# Patient Record
Sex: Female | Born: 1955 | Race: Black or African American | Hispanic: No | Smoking: Never smoker
Health system: Southern US, Community
[De-identification: ages and names within clinical notes are randomized; demographics above are authoritative.]

## PROBLEM LIST (undated history)

## (undated) DIAGNOSIS — Z923 Personal history of irradiation: Secondary | ICD-10-CM

## (undated) DIAGNOSIS — I10 Essential (primary) hypertension: Secondary | ICD-10-CM

## (undated) DIAGNOSIS — I5022 Chronic systolic (congestive) heart failure: Secondary | ICD-10-CM

## (undated) DIAGNOSIS — E079 Disorder of thyroid, unspecified: Secondary | ICD-10-CM

## (undated) DIAGNOSIS — K635 Polyp of colon: Secondary | ICD-10-CM

## (undated) DIAGNOSIS — C801 Malignant (primary) neoplasm, unspecified: Secondary | ICD-10-CM

## (undated) DIAGNOSIS — R112 Nausea with vomiting, unspecified: Secondary | ICD-10-CM

## (undated) DIAGNOSIS — Z803 Family history of malignant neoplasm of breast: Secondary | ICD-10-CM

## (undated) DIAGNOSIS — Z9889 Other specified postprocedural states: Secondary | ICD-10-CM

## (undated) DIAGNOSIS — Z806 Family history of leukemia: Secondary | ICD-10-CM

## (undated) HISTORY — DX: Disorder of thyroid, unspecified: E07.9

## (undated) HISTORY — DX: Chronic systolic (congestive) heart failure: I50.22

## (undated) HISTORY — PX: GANGLION CYST EXCISION: SHX1691

## (undated) HISTORY — DX: Family history of leukemia: Z80.6

## (undated) HISTORY — DX: Family history of malignant neoplasm of breast: Z80.3

---

## 1998-04-18 HISTORY — PX: ABDOMINAL HYSTERECTOMY: SHX81

## 2001-05-30 ENCOUNTER — Ambulatory Visit (HOSPITAL_COMMUNITY): Admission: RE | Admit: 2001-05-30 | Discharge: 2001-05-30 | Payer: Self-pay | Admitting: Specialist

## 2001-05-30 ENCOUNTER — Encounter: Payer: Self-pay | Admitting: Specialist

## 2001-08-17 ENCOUNTER — Ambulatory Visit (HOSPITAL_COMMUNITY): Admission: RE | Admit: 2001-08-17 | Discharge: 2001-08-17 | Payer: Self-pay | Admitting: General Surgery

## 2002-08-28 ENCOUNTER — Encounter: Payer: Self-pay | Admitting: Family Medicine

## 2002-08-28 ENCOUNTER — Ambulatory Visit (HOSPITAL_COMMUNITY): Admission: RE | Admit: 2002-08-28 | Discharge: 2002-08-28 | Payer: Self-pay | Admitting: Family Medicine

## 2003-09-19 ENCOUNTER — Ambulatory Visit (HOSPITAL_COMMUNITY): Admission: RE | Admit: 2003-09-19 | Discharge: 2003-09-19 | Payer: Self-pay | Admitting: Family Medicine

## 2003-09-25 ENCOUNTER — Ambulatory Visit (HOSPITAL_COMMUNITY): Admission: RE | Admit: 2003-09-25 | Discharge: 2003-09-25 | Payer: Self-pay | Admitting: General Surgery

## 2004-09-22 ENCOUNTER — Ambulatory Visit (HOSPITAL_COMMUNITY): Admission: RE | Admit: 2004-09-22 | Discharge: 2004-09-22 | Payer: Self-pay | Admitting: Family Medicine

## 2005-10-24 ENCOUNTER — Ambulatory Visit (HOSPITAL_COMMUNITY): Admission: RE | Admit: 2005-10-24 | Discharge: 2005-10-24 | Payer: Self-pay | Admitting: Specialist

## 2006-11-06 ENCOUNTER — Ambulatory Visit (HOSPITAL_COMMUNITY): Admission: RE | Admit: 2006-11-06 | Discharge: 2006-11-06 | Payer: Self-pay | Admitting: Specialist

## 2007-11-08 ENCOUNTER — Ambulatory Visit (HOSPITAL_COMMUNITY): Admission: RE | Admit: 2007-11-08 | Discharge: 2007-11-08 | Payer: Self-pay | Admitting: Family Medicine

## 2008-11-10 ENCOUNTER — Ambulatory Visit (HOSPITAL_COMMUNITY): Admission: RE | Admit: 2008-11-10 | Discharge: 2008-11-10 | Payer: Self-pay | Admitting: Family Medicine

## 2008-11-11 ENCOUNTER — Encounter: Payer: Self-pay | Admitting: Internal Medicine

## 2008-11-16 HISTORY — PX: COLONOSCOPY: SHX174

## 2008-11-18 ENCOUNTER — Ambulatory Visit (HOSPITAL_COMMUNITY): Admission: RE | Admit: 2008-11-18 | Discharge: 2008-11-18 | Payer: Self-pay | Admitting: Internal Medicine

## 2008-11-18 ENCOUNTER — Ambulatory Visit: Payer: Self-pay | Admitting: Internal Medicine

## 2009-11-12 ENCOUNTER — Ambulatory Visit (HOSPITAL_COMMUNITY): Admission: RE | Admit: 2009-11-12 | Discharge: 2009-11-12 | Payer: Self-pay | Admitting: Family Medicine

## 2010-08-31 NOTE — Op Note (Signed)
Patricia Nunez, Patricia Nunez               ACCOUNT NO.:  [id number]   MEDICAL RECORD NO.:  [id number]          PATIENT TYPE:  AMB   LOCATION:  DAY                           FACILITY:  [facility]   PHYSICIAN:  R. Roetta Sessions, M.D. DATE OF BIRTH:  1956/02/13   DATE OF PROCEDURE:  11/18/2008  DATE OF DISCHARGE:                               OPERATIVE REPORT   INDICATIONS FOR PROCEDURE:  A 55 year old lady underwent removal of a  rectal polyp by Dr. Katrinka Blazing some 5 years ago.  She is here for  surveillance.  Pathology is unknown on this polyp which was removed.  Patricia Nunez has no lower GI tract symptoms, and there is no family  history of colon polyps or colon cancer.  Colonoscopy is now being done  as a surveillance maneuver.  Risks, benefits, alternatives and  limitations have been discussed, questions answered.  Please see the  documentation in the medical record.   PROCEDURE NOTE:  O2 saturation, blood pressure, pulse respirations were  monitored throughout the entire procedure.  Conscious sedation with  Versed 3 mg IV and Demerol 50 mg IV in divided doses.  Instrument:  Pentax video chip system.   FINDINGS:  Digital rectal exam revealed no abnormalities.   ENDOSCOPIC FINDINGS:  The prep was good.   Colon:  Colonic mucosa was surveyed from the rectosigmoid junction  through the left transverse right colon to the appendiceal orifice,  ileocecal valve and cecum.  These structures were well seen and  photographed for the record.  From this level, scope was slowly  withdrawn.  All previous mentioned mucosal surfaces were again seen.  The colonic mucosa appeared normal.  Scope was pulled down in the rectum  where thorough examination of the rectal mucosa including retroflexed  view anal verge demonstrated no abnormalities.  The patient tolerated  the procedure well and was reactive to endoscopy.  Cecal withdrawal time  7 minutes.   IMPRESSION:  1. Normal rectum.  2. Normal colon.   RECOMMENDATIONS:  Repeat colonoscopy in 5 years.      Jonathon Bellows, M.D.  Electronically Signed     RMR/MEDQ  D:  11/18/2008  T:  11/18/2008  Job:  [id number]

## 2010-09-03 NOTE — H&P (Signed)
[facility]  Patient:    Patricia Nunez, Patricia Nunez Visit Number: [id number] MRN: [id number]          Service Type: Attending:  Elpidio Anis, M.D. Dictated by:   Elpidio Anis, M.D.                           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  She is status post excision of the cyst in July of 2000.  Because of the increasing size she wishes to have the mass removed.  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  She has no other major medical illness.  She has occasional seasonal allergies.  CURRENT MEDICATIONS:  Her only medication is Zoloft 50 mg q.d.  ALLERGIES:  She has no known drug allergies.  SURGERY: 1. Hysterectomy. 2. Removal of ganglion cyst, left wrist.  PHYSICAL EXAMINATION:  VITAL SIGNS:  Blood pressure 132/82, pulse 72, respirations 18, weight 109 pounds.  HEENT:  Unremarkable.  NECK:  Supple without JVD or bruit.  CHEST:  Clear to auscultation.  HEART:  Regular rate and rhythm without murmur, gallop or rub.  ABDOMEN:  Soft, nontender, no masses.  EXTREMITIES:  Unremarkable except for a large soft ganglion cyst on dorsal aspect of the left wrist.  She has no tenderness.  NEUROLOGIC EXAM:  Nonfocal.  No motor, sensory or cerebellar deficits.  IMPRESSION: 1. Ganglion cyst, left wrist, recurrent. 2. Mild depression.  PLAN:  Excision of ganglion cyst under Bier block. Dictated by:   Elpidio Anis, M.D. Attending:  Elpidio Anis, M.D. DD:  08/16/01 TD:  08/17/01 Job: [id number]

## 2010-09-03 NOTE — H&P (Signed)
NAMEROBBYE, DEDE                         ACCOUNT NO.:  [id number]   MEDICAL RECORD NO.:  [id number]                   PATIENT TYPE:  AMB   LOCATION:  DAY                                  FACILITY:  [facility]   PHYSICIAN:  Jerolyn Shin C. Katrinka Blazing, M.D.                DATE OF BIRTH:  11-15-1955   DATE OF ADMISSION:  09/25/2003  DATE OF DISCHARGE:                                HISTORY & PHYSICAL   HISTORY OF PRESENT ILLNESS:  [age]-year-old female is referred for screening  colonoscopy.  She has no GI complaints, no history of rectal bleeding.   PAST HISTORY:  Past history is positive only for depression.   CHRONIC MEDICATIONS:  None.   SURGERY:  Hysterectomy.   ALLERGIES:  None.   SOCIAL HISTORY:  She is employed with the [employer].  She is the mother of 1 child, divorced, does not drink, smoke or use drugs.   PHYSICAL EXAMINATION:  VITAL SIGNS:  On exam, blood pressure 110/80, pulse  76, respirations 20, weight 125 pounds.  HEENT:  Unremarkable.  NECK:  Neck is supple with no JVD or bruit.  CHEST:  Chest clear to auscultation.  HEART:  Heart regular rate and rhythm without murmur, gallop or rub.  ABDOMEN:  Abdomen soft and nontender.  No masses.  EXTREMITIES:  No cyanosis, clubbing or edema.  NEUROLOGICAL EXAM:  No focal motor, sensory or cerebellar deficit.   IMPRESSION:  Need for screening colonoscopy.   PLAN:  Screening colonoscopy.     ___________________________________________                                         Dirk Dress. Katrinka Blazing, M.D.   LCS/MEDQ  D:  09/24/2003  T:  09/25/2003  Job:  [id number]

## 2010-09-03 NOTE — Op Note (Signed)
[facility]  Patient:    Patricia Nunez, Patricia Nunez Visit Number: [id number] MRN: [id number]          Service Type: DSU Location: DAY Attending Physician:  Dessa Phi Dictated by:   Elpidio Anis, M.D. Proc. Date: 08/17/01 Admit Date:  08/17/2001 Discharge Date: 08/17/2001                             Operative Report  PREOPERATIVE DIAGNOSIS:  Ganglion cyst, left wrist.  POSTOPERATIVE DIAGNOSIS:  Ganglion cyst, left wrist.  PROCEDURE:  Excision of ganglion cyst, left wrist.  SURGEON:  Elpidio Anis, M.D.  DESCRIPTION OF PROCEDURE:  The patient was taken to the operating suite. Bier block was placed. The left arm was prepped and draped in a sterile field. A transverse incision was made over the dome of the cyst. The incision was taken down through the subcutaneous tissue until the wall of the cyst was exposed. A small retractor was placed and using small iris scissors, the cyst was circumferentially dissected. The base of the cyst was excised. The base of the cyst appeared to be the overlying fascia of the extensor tendons of the hand. Their tendons were not encountered and were not injured. The subcutaneous tissue was closed with two rows of 4-0 Dexon and the skin was closed with 4-0 Dexon. OpSite dressing and a Kerlix gauze dressing was placed. The patient tolerated the procedure well. She was transferred to a bed and taken to the post anesthesia care unit for monitoring. Dictated by:   Elpidio Anis, M.D. Attending Physician:  Dessa Phi DD:  08/17/01 TD:  08/19/01 Job: [id number]

## 2010-10-11 ENCOUNTER — Other Ambulatory Visit (HOSPITAL_COMMUNITY): Payer: Self-pay | Admitting: Family Medicine

## 2010-10-11 DIAGNOSIS — Z139 Encounter for screening, unspecified: Secondary | ICD-10-CM

## 2010-11-15 ENCOUNTER — Ambulatory Visit (HOSPITAL_COMMUNITY)
Admission: RE | Admit: 2010-11-15 | Discharge: 2010-11-15 | Disposition: A | Discharge status: Home / Self Care | Payer: BC Managed Care – PPO | Source: Ambulatory Visit | Attending: Family Medicine | Admitting: Family Medicine

## 2010-11-15 DIAGNOSIS — Z139 Encounter for screening, unspecified: Secondary | ICD-10-CM

## 2010-11-15 DIAGNOSIS — Z1231 Encounter for screening mammogram for malignant neoplasm of breast: Secondary | ICD-10-CM | POA: Insufficient documentation

## 2011-10-25 ENCOUNTER — Other Ambulatory Visit (HOSPITAL_COMMUNITY): Payer: Self-pay | Admitting: Family Medicine

## 2011-10-25 DIAGNOSIS — Z139 Encounter for screening, unspecified: Secondary | ICD-10-CM

## 2011-11-21 ENCOUNTER — Ambulatory Visit (HOSPITAL_COMMUNITY)
Admission: RE | Admit: 2011-11-21 | Discharge: 2011-11-21 | Disposition: A | Discharge status: Home / Self Care | Payer: BC Managed Care – PPO | Source: Ambulatory Visit | Attending: Family Medicine | Admitting: Family Medicine

## 2011-11-21 DIAGNOSIS — Z139 Encounter for screening, unspecified: Secondary | ICD-10-CM

## 2011-11-21 DIAGNOSIS — Z1231 Encounter for screening mammogram for malignant neoplasm of breast: Secondary | ICD-10-CM | POA: Insufficient documentation

## 2011-11-28 ENCOUNTER — Other Ambulatory Visit: Payer: Self-pay | Admitting: Family Medicine

## 2011-11-28 DIAGNOSIS — R928 Other abnormal and inconclusive findings on diagnostic imaging of breast: Secondary | ICD-10-CM

## 2011-11-30 ENCOUNTER — Ambulatory Visit
Admission: RE | Admit: 2011-11-30 | Discharge: 2011-11-30 | Disposition: A | Discharge status: Home / Self Care | Payer: BC Managed Care – PPO | Source: Ambulatory Visit | Attending: Family Medicine | Admitting: Family Medicine

## 2011-11-30 DIAGNOSIS — R928 Other abnormal and inconclusive findings on diagnostic imaging of breast: Secondary | ICD-10-CM

## 2012-10-29 ENCOUNTER — Other Ambulatory Visit (HOSPITAL_COMMUNITY): Payer: Self-pay | Admitting: Family Medicine

## 2012-10-29 DIAGNOSIS — Z09 Encounter for follow-up examination after completed treatment for conditions other than malignant neoplasm: Secondary | ICD-10-CM

## 2012-11-30 ENCOUNTER — Ambulatory Visit (HOSPITAL_COMMUNITY)
Admission: RE | Admit: 2012-11-30 | Discharge: 2012-11-30 | Disposition: A | Discharge status: Home / Self Care | Payer: BC Managed Care – PPO | Source: Ambulatory Visit | Attending: Family Medicine | Admitting: Family Medicine

## 2012-11-30 DIAGNOSIS — Z09 Encounter for follow-up examination after completed treatment for conditions other than malignant neoplasm: Secondary | ICD-10-CM

## 2012-11-30 DIAGNOSIS — Z1231 Encounter for screening mammogram for malignant neoplasm of breast: Secondary | ICD-10-CM | POA: Insufficient documentation

## 2013-10-02 ENCOUNTER — Other Ambulatory Visit (HOSPITAL_COMMUNITY): Payer: Self-pay | Admitting: Family Medicine

## 2013-10-02 DIAGNOSIS — Z1231 Encounter for screening mammogram for malignant neoplasm of breast: Secondary | ICD-10-CM

## 2013-11-25 ENCOUNTER — Encounter: Payer: Self-pay | Admitting: Internal Medicine

## 2013-12-02 ENCOUNTER — Ambulatory Visit (HOSPITAL_COMMUNITY)
Admission: RE | Admit: 2013-12-02 | Discharge: 2013-12-02 | Disposition: A | Discharge status: Home / Self Care | Payer: BC Managed Care – PPO | Source: Ambulatory Visit | Attending: Family Medicine | Admitting: Family Medicine

## 2013-12-02 DIAGNOSIS — Z1231 Encounter for screening mammogram for malignant neoplasm of breast: Secondary | ICD-10-CM | POA: Insufficient documentation

## 2014-03-04 NOTE — Patient Instructions (Signed)
Patricia Nunez  03/04/2014   Your procedure is scheduled on: 03/10/14  Report to [facility] at 2355DD.  Call this number if you have problems the morning of surgery: [phone]   Remember:   Do not eat food or drink liquids after midnight.   Take these medicines the morning of surgery with A SIP OF WATER: lisinopril   Do not wear jewelry, make-up or nail polish.  Do not wear lotions, powders, or perfumes. You may wear deodorant.  Do not shave 48 hours prior to surgery. Men may shave face and neck.  Do not bring valuables to the hospital.  [facility] is not responsible                  for any belongings or valuables.               Contacts, dentures or bridgework may not be worn into surgery.  Leave suitcase in the car. After surgery it may be brought to your room.  For patients admitted to the hospital, discharge time is determined by your                treatment team.               Patients discharged the day of surgery will not be allowed to drive  home.  Name and phone number of your driver: family  Special Instructions: N/A   Please read over the following fact sheets that you were given: Anesthesia Post-op Instructions and Care and Recovery After Surgery    PATIENT INSTRUCTIONS POST-ANESTHESIA  IMMEDIATELY FOLLOWING SURGERY:  Do not drive or operate machinery for the first twenty four hours after surgery.  Do not make any important decisions for twenty four hours after surgery or while taking narcotic pain medications or sedatives.  If you develop intractable nausea and vomiting or a severe headache please notify your doctor immediately.  FOLLOW-UP:  Please make an appointment with your surgeon as instructed. You do not need to follow up with anesthesia unless specifically instructed to do so.  WOUND CARE INSTRUCTIONS (if applicable):  Keep a dry clean dressing on the anesthesia/puncture wound site if there is drainage.  Once the wound has quit draining you may leave it  open to air.  Generally you should leave the bandage intact for twenty four hours unless there is drainage.  If the epidural site drains for more than 36-48 hours please call the anesthesia department.  QUESTIONS?:  Please feel free to call your physician or the hospital operator if you have any questions, and they will be happy to assist you.      Cataract A cataract is a clouding of the lens of the eye. When a lens becomes cloudy, vision is reduced based on the degree and nature of the clouding. Many cataracts reduce vision to some degree. Some cataracts make people more near-sighted as they develop. Other cataracts increase glare. Cataracts that are ignored and become worse can sometimes look white. The white color can be seen through the pupil. CAUSES   Aging. However, cataracts may occur at any age, even in newborns.  Certain drugs.  Trauma to the eye.  Certain diseases such as diabetes.  Specific eye diseases such as chronic inflammation inside the eye or a sudden attack of a rare form of glaucoma.  Inherited or acquired medical problems. SYMPTOMS   Gradual, progressive drop in vision in the affected eye.  Severe, rapid visual loss. This most  often happens when trauma is the cause. DIAGNOSIS  To detect a cataract, an eye doctor examines the lens. Cataracts are best diagnosed with an exam of the eyes with the pupils enlarged (dilated) by drops.  TREATMENT  For an early cataract, vision may improve by using different eyeglasses or stronger lighting. If that does not help your vision, surgery is the only effective treatment. A cataract needs to be surgically removed when vision loss interferes with your everyday activities, such as driving, reading, or watching TV. A cataract may also have to be removed if it prevents examination or treatment of another eye problem. Surgery removes the cloudy lens and usually replaces it with a substitute lens (intraocular lens, IOL).  At a time when  both you and your doctor agree, the cataract will be surgically removed. If you have cataracts in both eyes, only one is usually removed at a time. This allows the operated eye to heal and be out of danger from any possible problems after surgery (such as infection or poor wound healing). In rare cases, a cataract may be doing damage to your eye. In these cases, your caregiver may advise surgical removal right away. The vast majority of people who have cataract surgery have better vision afterward. HOME CARE INSTRUCTIONS  If you are not planning surgery, you may be asked to do the following:  Use different eyeglasses.  Use stronger or brighter lighting.  Ask your eye doctor about reducing your medicine dose or changing medicines if it is thought that a medicine caused your cataract. Changing medicines does not make the cataract go away on its own.  Become familiar with your surroundings. Poor vision can lead to injury. Avoid bumping into things on the affected side. You are at a higher risk for tripping or falling.  Exercise extreme care when driving or operating machinery.  Wear sunglasses if you are sensitive to bright light or experiencing problems with glare. SEEK IMMEDIATE MEDICAL CARE IF:   You have a worsening or sudden vision loss.  You notice redness, swelling, or increasing pain in the eye.  You have a fever. Document Released: 04/04/2005 Document Revised: 06/27/2011 Document Reviewed: 11/26/2010 [facility] Patient Information 2015 [place], [place]. This information is not intended to replace advice given to you by your health care provider. Make sure you discuss any questions you have with your health care provider.

## 2014-03-05 ENCOUNTER — Other Ambulatory Visit: Payer: Self-pay

## 2014-03-05 ENCOUNTER — Encounter (HOSPITAL_COMMUNITY)
Admission: RE | Admit: 2014-03-05 | Discharge: 2014-03-05 | Disposition: A | Discharge status: Home / Self Care | Payer: BC Managed Care – PPO | Source: Ambulatory Visit | Attending: Ophthalmology | Admitting: Ophthalmology

## 2014-03-05 ENCOUNTER — Encounter (HOSPITAL_COMMUNITY): Payer: Self-pay

## 2014-03-05 DIAGNOSIS — Z01818 Encounter for other preprocedural examination: Secondary | ICD-10-CM | POA: Insufficient documentation

## 2014-03-05 HISTORY — DX: Other specified postprocedural states: Z98.890

## 2014-03-05 HISTORY — DX: Nausea with vomiting, unspecified: R11.2

## 2014-03-05 HISTORY — DX: Essential (primary) hypertension: I10

## 2014-03-05 LAB — HEMOGLOBIN AND HEMATOCRIT, BLOOD
HEMATOCRIT: 37.2 % (ref 36.0–46.0)
Hemoglobin: 12.4 g/dL (ref 12.0–15.0)

## 2014-03-05 LAB — BASIC METABOLIC PANEL
ANION GAP: 11 (ref 5–15)
BUN: 13 mg/dL (ref 6–23)
CALCIUM: 9.8 mg/dL (ref 8.4–10.5)
CO2: 28 meq/L (ref 19–32)
CREATININE: 0.73 mg/dL (ref 0.50–1.10)
Chloride: 102 mEq/L (ref 96–112)
GFR calc Af Amer: >90 mL/min (ref >90)
GFR calc non Af Amer: >90 mL/min (ref >90)
Glucose, Bld: 109 mg/dL — ABNORMAL HIGH (ref 70–99)
Potassium: 4.5 mEq/L (ref 3.7–5.3)
Sodium: 141 mEq/L (ref 137–147)

## 2014-03-05 NOTE — Pre-Procedure Instructions (Signed)
Patient given information to sign up for my chart at home. 

## 2014-03-07 MED ORDER — LIDOCAINE HCL (PF) 1 % IJ SOLN
INTRAMUSCULAR | Status: AC
  Filled 2014-03-07: qty 2

## 2014-03-07 MED ORDER — NEOMYCIN-POLYMYXIN-DEXAMETH 3.5-10000-0.1 OP SUSP
OPHTHALMIC | Status: AC
  Filled 2014-03-07: qty 5

## 2014-03-07 MED ORDER — PHENYLEPHRINE HCL 2.5 % OP SOLN
OPHTHALMIC | Status: AC
  Filled 2014-03-07: qty 15

## 2014-03-07 MED ORDER — CYCLOPENTOLATE-PHENYLEPHRINE OP SOLN OPTIME - NO CHARGE
OPHTHALMIC | Status: AC
  Filled 2014-03-07: qty 2

## 2014-03-07 MED ORDER — TETRACAINE HCL 0.5 % OP SOLN
OPHTHALMIC | Status: AC
  Filled 2014-03-07: qty 2

## 2014-03-07 MED ORDER — LIDOCAINE HCL 3.5 % OP GEL
OPHTHALMIC | Status: AC
  Filled 2014-03-07: qty 1

## 2014-03-10 ENCOUNTER — Ambulatory Visit (HOSPITAL_COMMUNITY): Payer: BC Managed Care – PPO | Admitting: Anesthesiology

## 2014-03-10 ENCOUNTER — Encounter (HOSPITAL_COMMUNITY): Payer: Self-pay | Admitting: *Deleted

## 2014-03-10 ENCOUNTER — Ambulatory Visit (HOSPITAL_COMMUNITY)
Admission: RE | Admit: 2014-03-10 | Discharge: 2014-03-10 | Disposition: A | Discharge status: Home / Self Care | Payer: BC Managed Care – PPO | Source: Ambulatory Visit | Attending: Ophthalmology | Admitting: Ophthalmology

## 2014-03-10 ENCOUNTER — Encounter (HOSPITAL_COMMUNITY)
Admission: RE | Disposition: A | Discharge status: Home / Self Care | Payer: Self-pay | Source: Ambulatory Visit | Attending: Ophthalmology

## 2014-03-10 DIAGNOSIS — Z79899 Other long term (current) drug therapy: Secondary | ICD-10-CM | POA: Diagnosis not present

## 2014-03-10 DIAGNOSIS — H25812 Combined forms of age-related cataract, left eye: Secondary | ICD-10-CM | POA: Insufficient documentation

## 2014-03-10 DIAGNOSIS — Z7982 Long term (current) use of aspirin: Secondary | ICD-10-CM | POA: Insufficient documentation

## 2014-03-10 DIAGNOSIS — I10 Essential (primary) hypertension: Secondary | ICD-10-CM | POA: Insufficient documentation

## 2014-03-10 HISTORY — PX: CATARACT EXTRACTION W/PHACO: SHX586

## 2014-03-10 SURGERY — PHACOEMULSIFICATION, CATARACT, WITH IOL INSERTION
Anesthesia: Monitor Anesthesia Care | Site: Eye | Laterality: Left

## 2014-03-10 MED ORDER — LIDOCAINE HCL 3.5 % OP GEL
1.0000 "application " | Freq: Once | OPHTHALMIC | Status: AC
  Administered 2014-03-10: 1 via OPHTHALMIC

## 2014-03-10 MED ORDER — EPINEPHRINE HCL 1 MG/ML IJ SOLN
INTRAOCULAR | Status: DC | PRN
  Administered 2014-03-10: 500 mL

## 2014-03-10 MED ORDER — MIDAZOLAM HCL 2 MG/2ML IJ SOLN
INTRAMUSCULAR | Status: AC
  Filled 2014-03-10: qty 2

## 2014-03-10 MED ORDER — BSS IO SOLN
INTRAOCULAR | Status: DC | PRN
  Administered 2014-03-10: 15 mL

## 2014-03-10 MED ORDER — CYCLOPENTOLATE-PHENYLEPHRINE 0.2-1 % OP SOLN
1.0000 [drp] | OPHTHALMIC | Status: AC
  Administered 2014-03-10 (×3): 1 [drp] via OPHTHALMIC

## 2014-03-10 MED ORDER — PROVISC 10 MG/ML IO SOLN
INTRAOCULAR | Status: DC | PRN
  Administered 2014-03-10: 0.85 mL via INTRAOCULAR

## 2014-03-10 MED ORDER — LACTATED RINGERS IV SOLN
INTRAVENOUS | Status: DC
  Administered 2014-03-10: 1000 mL via INTRAVENOUS

## 2014-03-10 MED ORDER — MIDAZOLAM HCL 2 MG/2ML IJ SOLN
1.0000 mg | INTRAMUSCULAR | Status: DC | PRN
  Administered 2014-03-10: 2 mg via INTRAVENOUS

## 2014-03-10 MED ORDER — LIDOCAINE HCL (PF) 1 % IJ SOLN
INTRAMUSCULAR | Status: DC | PRN
  Administered 2014-03-10: .8 mL

## 2014-03-10 MED ORDER — FENTANYL CITRATE 0.05 MG/ML IJ SOLN
25.0000 ug | INTRAMUSCULAR | Status: AC
  Administered 2014-03-10 (×2): 25 ug via INTRAVENOUS

## 2014-03-10 MED ORDER — FENTANYL CITRATE 0.05 MG/ML IJ SOLN
INTRAMUSCULAR | Status: AC
  Filled 2014-03-10: qty 2

## 2014-03-10 MED ORDER — TETRACAINE HCL 0.5 % OP SOLN
1.0000 [drp] | OPHTHALMIC | Status: AC
  Administered 2014-03-10 (×3): 1 [drp] via OPHTHALMIC

## 2014-03-10 MED ORDER — EPINEPHRINE HCL 1 MG/ML IJ SOLN
INTRAMUSCULAR | Status: AC
  Filled 2014-03-10: qty 1

## 2014-03-10 MED ORDER — PHENYLEPHRINE HCL 2.5 % OP SOLN
1.0000 [drp] | OPHTHALMIC | Status: AC
  Administered 2014-03-10 (×3): 1 [drp] via OPHTHALMIC

## 2014-03-10 MED ORDER — ONDANSETRON HCL 4 MG/2ML IJ SOLN
INTRAMUSCULAR | Status: AC
  Filled 2014-03-10: qty 2

## 2014-03-10 MED ORDER — NEOMYCIN-POLYMYXIN-DEXAMETH 3.5-10000-0.1 OP SUSP
OPHTHALMIC | Status: DC | PRN
  Administered 2014-03-10: 2 [drp] via OPHTHALMIC

## 2014-03-10 MED ORDER — POVIDONE-IODINE 5 % OP SOLN
OPHTHALMIC | Status: DC | PRN
  Administered 2014-03-10: 1 via OPHTHALMIC

## 2014-03-10 SURGICAL SUPPLY — 34 items
CAPSULAR TENSION RING-AMO (OPHTHALMIC RELATED) IMPLANT
CLOTH BEACON ORANGE TIMEOUT ST (SAFETY) ×2 IMPLANT
EYE SHIELD UNIVERSAL CLEAR (GAUZE/BANDAGES/DRESSINGS) ×2 IMPLANT
GLOVE BIO SURGEON STRL SZ 6.5 (GLOVE) IMPLANT
GLOVE BIO SURGEONS STRL SZ 6.5 (GLOVE)
GLOVE BIOGEL PI IND STRL 6.5 (GLOVE) IMPLANT
GLOVE BIOGEL PI IND STRL 7.0 (GLOVE) IMPLANT
GLOVE BIOGEL PI IND STRL 7.5 (GLOVE) IMPLANT
GLOVE BIOGEL PI INDICATOR 6.5 (GLOVE)
GLOVE BIOGEL PI INDICATOR 7.0 (GLOVE) ×2
GLOVE BIOGEL PI INDICATOR 7.5 (GLOVE) ×2
GLOVE ECLIPSE 6.5 STRL STRAW (GLOVE) IMPLANT
GLOVE ECLIPSE 7.0 STRL STRAW (GLOVE) IMPLANT
GLOVE ECLIPSE 7.5 STRL STRAW (GLOVE) IMPLANT
GLOVE EXAM NITRILE LRG STRL (GLOVE) IMPLANT
GLOVE EXAM NITRILE MD LF STRL (GLOVE) IMPLANT
GLOVE SKINSENSE NS SZ6.5 (GLOVE)
GLOVE SKINSENSE NS SZ7.0 (GLOVE)
GLOVE SKINSENSE STRL SZ6.5 (GLOVE) IMPLANT
GLOVE SKINSENSE STRL SZ7.0 (GLOVE) IMPLANT
KIT VITRECTOMY (OPHTHALMIC RELATED) IMPLANT
PAD ARMBOARD 7.5X6 YLW CONV (MISCELLANEOUS) ×2 IMPLANT
PROC W NO LENS (INTRAOCULAR LENS)
PROC W SPEC LENS (INTRAOCULAR LENS)
PROCESS W NO LENS (INTRAOCULAR LENS) IMPLANT
PROCESS W SPEC LENS (INTRAOCULAR LENS) IMPLANT
RETRACTOR IRIS SIGHTPATH (OPHTHALMIC RELATED) IMPLANT
RING MALYGIN (MISCELLANEOUS) IMPLANT
SIGHTPATH CAT PROC W REG LENS (Ophthalmic Related) ×3 IMPLANT
SYRINGE LUER LOK 1CC (MISCELLANEOUS) ×2 IMPLANT
TAPE SURG TRANSPORE 1 IN (GAUZE/BANDAGES/DRESSINGS) IMPLANT
TAPE SURGICAL TRANSPORE 1 IN (GAUZE/BANDAGES/DRESSINGS) ×2
VISCOELASTIC ADDITIONAL (OPHTHALMIC RELATED) IMPLANT
WATER STERILE IRR 250ML POUR (IV SOLUTION) ×2 IMPLANT

## 2014-03-10 NOTE — Anesthesia Preprocedure Evaluation (Signed)
Anesthesia Evaluation  Patient identified by MRN, date of birth, ID band Patient awake    Reviewed: Allergy & Precautions, H&P , NPO status , Patient's Chart, lab work & pertinent test results  History of Anesthesia Complications (+) PONV and history of anesthetic complications  Airway Mallampati: II  TM Distance: >3 FB     Dental  (+) Teeth Intact   Pulmonary neg pulmonary ROS,  breath sounds clear to auscultation        Cardiovascular hypertension, Pt. on medications Rhythm:Regular Rate:Normal     Neuro/Psych    GI/Hepatic negative GI ROS,   Endo/Other    Renal/GU      Musculoskeletal   Abdominal   Peds  Hematology   Anesthesia Other Findings   Reproductive/Obstetrics                             Anesthesia Physical Anesthesia Plan  ASA: II  Anesthesia Plan: MAC   Post-op Pain Management:    Induction: Intravenous  Airway Management Planned: Nasal Cannula  Additional Equipment:   Intra-op Plan:   Post-operative Plan:   Informed Consent: I have reviewed the patients History and Physical, chart, labs and discussed the procedure including the risks, benefits and alternatives for the proposed anesthesia with the patient or authorized representative who has indicated his/her understanding and acceptance.     Plan Discussed with:   Anesthesia Plan Comments:         Anesthesia Quick Evaluation  

## 2014-03-10 NOTE — Discharge Instructions (Addendum)

## 2014-03-10 NOTE — Anesthesia Postprocedure Evaluation (Signed)
  Anesthesia Post-op Note  Patient: Patricia Nunez  Procedure(s) Performed: Procedure(s) (LRB): CATARACT EXTRACTION PHACO AND INTRAOCULAR LENS PLACEMENT LEFT EYE (Left)  Patient Location:  Short Stay  Anesthesia Type: MAC  Level of Consciousness: awake  Airway and Oxygen Therapy: Patient Spontanous Breathing  Post-op Pain: none  Post-op Assessment: Post-op Vital signs reviewed, Patient's Cardiovascular Status Stable, Respiratory Function Stable, Patent Airway, No signs of Nausea or vomiting and Pain level controlled  Post-op Vital Signs: Reviewed and stable  Complications: No apparent anesthesia complications

## 2014-03-10 NOTE — H&P (Signed)
I have reviewed the H&P, the patient was re-examined, and I have identified no interval changes in medical condition and plan of care since the history and physical of record  

## 2014-03-10 NOTE — Op Note (Signed)
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  Assistants: None  Anesthesia: Topical with MAC  Indications: Painless, progressive loss of vision with compromise of daily activities.  Surgery: Cataract Extraction with Intraocular lens Implant Left Eye  Discription: The patient had dilating drops and viscous lidocaine placed into the Left eye in the pre-op holding area. After transfer to the operating room, a time out was performed. The patient was then prepped and draped. Beginning with a 22 degree blade a paracentesis port was made at the surgeon's 2 o'clock position. The anterior chamber was then filled with 1% non-preserved lidocaine. This was followed by filling the anterior chamber with Provisc.  A 2.68mm keratome blade was used to make a clear corneal incision at the temporal limbus.  A bent cystatome needle was used to create a continuous tear capsulotomy. Hydrodissection was performed with balanced salt solution on a Fine canula. The lens nucleus was then removed using the phacoemulsification handpiece. Residual cortex was removed with the I&A handpiece. The anterior chamber and capsular bag were refilled with Provisc. A posterior chamber intraocular lens was placed into the capsular bag with it's injector. The implant was positioned with the Kuglan hook. The Provisc was then removed from the anterior chamber and capsular bag with the I&A handpiece. Stromal hydration of the main incision and paracentesis port was performed with BSS on a Fine canula. The wounds were tested for leak which was negative. The patient tolerated the procedure well. There were no operative complications. The patient was then transferred to the recovery room in stable condition.  Complications: None  Specimen: None  EBL: None  Prosthetic device: Hoya iSert 250, power 17.5 D,  SN [id number].

## 2014-03-10 NOTE — Addendum Note (Signed)
Addendum  created 03/10/14 0813 by Vista Deck, CRNA   Modules edited: Flowsheet VN   Flowsheet VN:  [phone] Status

## 2014-03-10 NOTE — Transfer of Care (Signed)
Immediate Anesthesia Transfer of Care Note  Patient: Patricia Nunez  Procedure(s) Performed: Procedure(s) (LRB): CATARACT EXTRACTION PHACO AND INTRAOCULAR LENS PLACEMENT LEFT EYE (Left)  Patient Location: Shortstay  Anesthesia Type: MAC  Level of Consciousness: awake  Airway & Oxygen Therapy: Patient Spontanous Breathing   Post-op Assessment: Report given to PACU RN, Post -op Vital signs reviewed and stable and Patient moving all extremities  Post vital signs: Reviewed and stable  Complications: No apparent anesthesia complications

## 2014-03-10 NOTE — Anesthesia Procedure Notes (Signed)
Procedure Name: MAC Date/Time: 03/10/2014 7:53 AM Performed by: Vista Deck Pre-anesthesia Checklist: Patient identified, Emergency Drugs available, Suction available, Timeout performed and Patient being monitored Patient Re-evaluated:Patient Re-evaluated prior to inductionOxygen Delivery Method: Nasal Cannula

## 2014-03-11 ENCOUNTER — Encounter (HOSPITAL_COMMUNITY): Payer: Self-pay | Admitting: Ophthalmology

## 2014-04-08 ENCOUNTER — Encounter (HOSPITAL_COMMUNITY)
Admission: RE | Admit: 2014-04-08 | Discharge: 2014-04-08 | Disposition: A | Discharge status: Home / Self Care | Payer: BC Managed Care – PPO | Source: Ambulatory Visit | Attending: Ophthalmology | Admitting: Ophthalmology

## 2014-04-08 ENCOUNTER — Encounter (HOSPITAL_COMMUNITY): Payer: Self-pay

## 2014-04-14 ENCOUNTER — Ambulatory Visit (HOSPITAL_COMMUNITY): Payer: BC Managed Care – PPO | Admitting: Anesthesiology

## 2014-04-14 ENCOUNTER — Encounter (HOSPITAL_COMMUNITY)
Admission: RE | Disposition: A | Discharge status: Home / Self Care | Payer: Self-pay | Source: Ambulatory Visit | Attending: Ophthalmology

## 2014-04-14 ENCOUNTER — Ambulatory Visit (HOSPITAL_COMMUNITY)
Admission: RE | Admit: 2014-04-14 | Discharge: 2014-04-14 | Disposition: A | Discharge status: Home / Self Care | Payer: BC Managed Care – PPO | Source: Ambulatory Visit | Attending: Ophthalmology | Admitting: Ophthalmology

## 2014-04-14 ENCOUNTER — Encounter (HOSPITAL_COMMUNITY): Payer: Self-pay | Admitting: *Deleted

## 2014-04-14 DIAGNOSIS — H25811 Combined forms of age-related cataract, right eye: Secondary | ICD-10-CM | POA: Insufficient documentation

## 2014-04-14 HISTORY — PX: CATARACT EXTRACTION W/PHACO: SHX586

## 2014-04-14 SURGERY — PHACOEMULSIFICATION, CATARACT, WITH IOL INSERTION
Anesthesia: Monitor Anesthesia Care | Site: Eye | Laterality: Right

## 2014-04-14 MED ORDER — NEOMYCIN-POLYMYXIN-DEXAMETH 3.5-10000-0.1 OP SUSP
OPHTHALMIC | Status: DC | PRN
  Administered 2014-04-14: 2 [drp] via OPHTHALMIC

## 2014-04-14 MED ORDER — EPINEPHRINE HCL 1 MG/ML IJ SOLN
INTRAOCULAR | Status: DC | PRN
  Administered 2014-04-14: 500 mL

## 2014-04-14 MED ORDER — FENTANYL CITRATE 0.05 MG/ML IJ SOLN
INTRAMUSCULAR | Status: AC
  Filled 2014-04-14: qty 2

## 2014-04-14 MED ORDER — LIDOCAINE HCL (PF) 1 % IJ SOLN
INTRAMUSCULAR | Status: DC | PRN
  Administered 2014-04-14: .5 mL

## 2014-04-14 MED ORDER — CYCLOPENTOLATE-PHENYLEPHRINE 0.2-1 % OP SOLN
1.0000 [drp] | OPHTHALMIC | Status: AC
  Administered 2014-04-14 (×3): 1 [drp] via OPHTHALMIC

## 2014-04-14 MED ORDER — LIDOCAINE HCL 3.5 % OP GEL
1.0000 "application " | Freq: Once | OPHTHALMIC | Status: AC
  Administered 2014-04-14: 1 via OPHTHALMIC

## 2014-04-14 MED ORDER — EPINEPHRINE HCL 1 MG/ML IJ SOLN
INTRAMUSCULAR | Status: AC
  Filled 2014-04-14: qty 1

## 2014-04-14 MED ORDER — FENTANYL CITRATE 0.05 MG/ML IJ SOLN
25.0000 ug | INTRAMUSCULAR | Status: AC
  Administered 2014-04-14: 25 ug via INTRAVENOUS

## 2014-04-14 MED ORDER — MIDAZOLAM HCL 2 MG/2ML IJ SOLN
1.0000 mg | INTRAMUSCULAR | Status: DC | PRN
Start: 2014-04-14 — End: 2014-04-14
  Administered 2014-04-14: 2 mg via INTRAVENOUS
  Filled 2014-04-14: qty 2

## 2014-04-14 MED ORDER — LACTATED RINGERS IV SOLN
INTRAVENOUS | Status: DC
  Administered 2014-04-14: 07:00:00 via INTRAVENOUS

## 2014-04-14 MED ORDER — PROVISC 10 MG/ML IO SOLN
INTRAOCULAR | Status: DC | PRN
  Administered 2014-04-14: 0.85 mL via INTRAOCULAR

## 2014-04-14 MED ORDER — ONDANSETRON HCL 4 MG/2ML IJ SOLN
INTRAMUSCULAR | Status: AC
  Filled 2014-04-14: qty 2

## 2014-04-14 MED ORDER — BSS IO SOLN
INTRAOCULAR | Status: DC | PRN
  Administered 2014-04-14: 15 mL

## 2014-04-14 MED ORDER — LIDOCAINE 3.5 % OP GEL OPTIME - NO CHARGE
OPHTHALMIC | Status: DC | PRN
  Administered 2014-04-14: 1 [drp] via OPHTHALMIC

## 2014-04-14 MED ORDER — PHENYLEPHRINE HCL 2.5 % OP SOLN
1.0000 [drp] | OPHTHALMIC | Status: AC
  Administered 2014-04-14 (×3): 1 [drp] via OPHTHALMIC

## 2014-04-14 MED ORDER — POVIDONE-IODINE 5 % OP SOLN
OPHTHALMIC | Status: DC | PRN
  Administered 2014-04-14: 1 via OPHTHALMIC

## 2014-04-14 MED ORDER — ONDANSETRON HCL 4 MG/2ML IJ SOLN
4.0000 mg | Freq: Once | INTRAMUSCULAR | Status: AC
  Administered 2014-04-14: 4 mg via INTRAVENOUS

## 2014-04-14 MED ORDER — TETRACAINE HCL 0.5 % OP SOLN
1.0000 [drp] | OPHTHALMIC | Status: AC
Start: 2014-04-14 — End: 2014-04-14
  Administered 2014-04-14 (×3): 1 [drp] via OPHTHALMIC

## 2014-04-14 SURGICAL SUPPLY — 34 items
CAPSULAR TENSION RING-AMO (OPHTHALMIC RELATED) IMPLANT
CLOTH BEACON ORANGE TIMEOUT ST (SAFETY) ×2 IMPLANT
EYE SHIELD UNIVERSAL CLEAR (GAUZE/BANDAGES/DRESSINGS) ×2 IMPLANT
GLOVE BIO SURGEON STRL SZ 6.5 (GLOVE) IMPLANT
GLOVE BIO SURGEONS STRL SZ 6.5 (GLOVE)
GLOVE BIOGEL PI IND STRL 6.5 (GLOVE) IMPLANT
GLOVE BIOGEL PI IND STRL 7.0 (GLOVE) IMPLANT
GLOVE BIOGEL PI IND STRL 7.5 (GLOVE) IMPLANT
GLOVE BIOGEL PI INDICATOR 6.5 (GLOVE)
GLOVE BIOGEL PI INDICATOR 7.0 (GLOVE) ×4
GLOVE BIOGEL PI INDICATOR 7.5 (GLOVE)
GLOVE ECLIPSE 6.5 STRL STRAW (GLOVE) IMPLANT
GLOVE ECLIPSE 7.0 STRL STRAW (GLOVE) IMPLANT
GLOVE ECLIPSE 7.5 STRL STRAW (GLOVE) IMPLANT
GLOVE EXAM NITRILE LRG STRL (GLOVE) IMPLANT
GLOVE EXAM NITRILE MD LF STRL (GLOVE) IMPLANT
GLOVE SKINSENSE NS SZ6.5 (GLOVE)
GLOVE SKINSENSE NS SZ7.0 (GLOVE)
GLOVE SKINSENSE STRL SZ6.5 (GLOVE) IMPLANT
GLOVE SKINSENSE STRL SZ7.0 (GLOVE) IMPLANT
KIT VITRECTOMY (OPHTHALMIC RELATED) IMPLANT
PAD ARMBOARD 7.5X6 YLW CONV (MISCELLANEOUS) ×2 IMPLANT
PROC W NO LENS (INTRAOCULAR LENS)
PROC W SPEC LENS (INTRAOCULAR LENS)
PROCESS W NO LENS (INTRAOCULAR LENS) IMPLANT
PROCESS W SPEC LENS (INTRAOCULAR LENS) IMPLANT
RETRACTOR IRIS SIGHTPATH (OPHTHALMIC RELATED) IMPLANT
RING MALYGIN (MISCELLANEOUS) IMPLANT
SIGHTPATH CAT PROC W REG LENS (Ophthalmic Related) ×3 IMPLANT
SYRINGE LUER LOK 1CC (MISCELLANEOUS) ×2 IMPLANT
TAPE SURG TRANSPORE 1 IN (GAUZE/BANDAGES/DRESSINGS) IMPLANT
TAPE SURGICAL TRANSPORE 1 IN (GAUZE/BANDAGES/DRESSINGS) ×2
VISCOELASTIC ADDITIONAL (OPHTHALMIC RELATED) IMPLANT
WATER STERILE IRR 250ML POUR (IV SOLUTION) ×2 IMPLANT

## 2014-04-14 NOTE — Op Note (Signed)
Date of Admission: 04/14/2014  Date of Surgery: 04/14/2014   Pre-Op Dx: Cataract Right Eye  Post-Op Dx: Senile Combined Cataract Right  Eye,  Dx Code E26.834  Surgeon: Tonny Branch, M.D.  Assistants: None  Anesthesia: Topical with MAC  Indications: Painless, progressive loss of vision with compromise of daily activities.  Surgery: Cataract Extraction with Intraocular lens Implant Right Eye  Discription: The patient had dilating drops and viscous lidocaine placed into the Right eye in the pre-op holding area. After transfer to the operating room, a time out was performed. The patient was then prepped and draped. Beginning with a 6 degree blade a paracentesis port was made at the surgeon's 2 o'clock position. The anterior chamber was then filled with 1% non-preserved lidocaine. This was followed by filling the anterior chamber with Provisc.  A 2.92mm keratome blade was used to make a clear corneal incision at the temporal limbus.  A bent cystatome needle was used to create a continuous tear capsulotomy. Hydrodissection was performed with balanced salt solution on a Fine canula. The lens nucleus was then removed using the phacoemulsification handpiece. Residual cortex was removed with the I&A handpiece. The anterior chamber and capsular bag were refilled with Provisc. A posterior chamber intraocular lens was placed into the capsular bag with it's injector. The implant was positioned with the Kuglan hook. The Provisc was then removed from the anterior chamber and capsular bag with the I&A handpiece. Stromal hydration of the main incision and paracentesis port was performed with BSS on a Fine canula. The wounds were tested for leak which was negative. The patient tolerated the procedure well. There were no operative complications. The patient was then transferred to the recovery room in stable condition.  Complications: None  Specimen: None  EBL: None  Prosthetic device: Hoya iSert 250, power 17.0  D, SN [id number].

## 2014-04-14 NOTE — Anesthesia Postprocedure Evaluation (Signed)
  Anesthesia Post-op Note  Patient: Patricia Nunez  Procedure(s) Performed: Procedure(s) (LRB): CATARACT EXTRACTION PHACO AND INTRAOCULAR LENS PLACEMENT (IOC) (Right)  Patient Location:  Short Stay  Anesthesia Type: MAC  Level of Consciousness: awake  Airway and Oxygen Therapy: Patient Spontanous Breathing  Post-op Pain: none  Post-op Assessment: Post-op Vital signs reviewed, Patient's Cardiovascular Status Stable, Respiratory Function Stable, Patent Airway, No signs of Nausea or vomiting and Pain level controlled  Post-op Vital Signs: Reviewed and stable  Complications: No apparent anesthesia complications

## 2014-04-14 NOTE — Anesthesia Procedure Notes (Signed)
Procedure Name: MAC Date/Time: 04/14/2014 7:27 AM Performed by: Vista Deck Pre-anesthesia Checklist: Patient identified, Emergency Drugs available, Suction available, Timeout performed and Patient being monitored Patient Re-evaluated:Patient Re-evaluated prior to inductionOxygen Delivery Method: Nasal Cannula

## 2014-04-14 NOTE — Addendum Note (Signed)
Addendum  created 04/14/14 0803 by Vista Deck, CRNA   Modules edited: Anesthesia Flowsheet, Anesthesia Medication Administration

## 2014-04-14 NOTE — H&P (Signed)
I have reviewed the H&P, the patient was re-examined, and I have identified no interval changes in medical condition and plan of care since the history and physical of record  

## 2014-04-14 NOTE — Discharge Instructions (Signed)

## 2014-04-14 NOTE — Anesthesia Preprocedure Evaluation (Signed)
Anesthesia Evaluation  Patient identified by MRN, date of birth, ID band Patient awake    Reviewed: Allergy & Precautions, H&P , NPO status , Patient's Chart, lab work & pertinent test results  History of Anesthesia Complications (+) PONV and history of anesthetic complications  Airway Mallampati: II  TM Distance: >3 FB     Dental  (+) Teeth Intact   Pulmonary neg pulmonary ROS,  breath sounds clear to auscultation        Cardiovascular hypertension, Pt. on medications Rhythm:Regular Rate:Normal     Neuro/Psych    GI/Hepatic negative GI ROS,   Endo/Other    Renal/GU      Musculoskeletal   Abdominal   Peds  Hematology   Anesthesia Other Findings   Reproductive/Obstetrics                             Anesthesia Physical Anesthesia Plan  ASA: II  Anesthesia Plan: MAC   Post-op Pain Management:    Induction: Intravenous  Airway Management Planned: Nasal Cannula  Additional Equipment:   Intra-op Plan:   Post-operative Plan:   Informed Consent: I have reviewed the patients History and Physical, chart, labs and discussed the procedure including the risks, benefits and alternatives for the proposed anesthesia with the patient or authorized representative who has indicated his/her understanding and acceptance.     Plan Discussed with:   Anesthesia Plan Comments:         Anesthesia Quick Evaluation

## 2014-04-14 NOTE — Transfer of Care (Signed)
Immediate Anesthesia Transfer of Care Note  Patient: Patricia Nunez  Procedure(s) Performed: Procedure(s) (LRB): CATARACT EXTRACTION PHACO AND INTRAOCULAR LENS PLACEMENT (IOC) (Right)  Patient Location: Shortstay  Anesthesia Type: MAC  Level of Consciousness: awake  Airway & Oxygen Therapy: Patient Spontanous Breathing   Post-op Assessment: Report given to PACU RN, Post -op Vital signs reviewed and stable and Patient moving all extremities  Post vital signs: Reviewed and stable  Complications: No apparent anesthesia complications

## 2014-04-15 ENCOUNTER — Encounter (HOSPITAL_COMMUNITY): Payer: Self-pay | Admitting: Ophthalmology

## 2014-10-14 ENCOUNTER — Other Ambulatory Visit (HOSPITAL_COMMUNITY): Payer: Self-pay | Admitting: Family Medicine

## 2014-10-14 DIAGNOSIS — Z1231 Encounter for screening mammogram for malignant neoplasm of breast: Secondary | ICD-10-CM

## 2014-11-25 ENCOUNTER — Encounter (INDEPENDENT_AMBULATORY_CARE_PROVIDER_SITE_OTHER): Payer: Self-pay

## 2014-11-25 ENCOUNTER — Other Ambulatory Visit: Payer: Self-pay

## 2014-11-25 ENCOUNTER — Ambulatory Visit (INDEPENDENT_AMBULATORY_CARE_PROVIDER_SITE_OTHER): Payer: BC Managed Care – PPO | Admitting: Gastroenterology

## 2014-11-25 ENCOUNTER — Encounter: Payer: Self-pay | Admitting: Gastroenterology

## 2014-11-25 VITALS — BP 98/60 | HR 88 | Temp 98.2°F | Ht 65.0 in | Wt 121.6 lb

## 2014-11-25 DIAGNOSIS — Z86010 Personal history of colonic polyps: Secondary | ICD-10-CM

## 2014-11-25 MED ORDER — PEG 3350-KCL-NA BICARB-NACL 420 G PO SOLR: 4000.0000 mL | Freq: Once | ORAL | Status: DC

## 2014-11-25 NOTE — Progress Notes (Signed)
Primary Care Physician:  Maggie Font, MD  Primary Gastroenterologist:  Garfield Cornea, MD   Chief Complaint  Patient presents with  . OTHER    colonoscopy/ hx of polyps/ no problems    HPI:  KASHIRA BEHUNIN is a 59 y.o. female here to schedule surveillance colonoscopy. She was due last year. She has a history of colon polyps removed by Dr. Tamala Julian in the past, pathology unavailable. Last colonoscopy in August 2010 by Dr. Gala Romney, normal. Doing well. No constipation, diarrhea, abdominal pain, unintentional weight loss, heartburn, nausea or vomiting, dysphagia.  Current Outpatient Prescriptions  Medication Sig Dispense Refill  . aspirin 81 MG tablet Take 81 mg by mouth daily.      . calcium carbonate (OS-CAL) 600 MG TABS Take 600 mg by mouth daily with breakfast.     . Cholecalciferol (VITAMIN D) 400 UNITS capsule Take 400 Units by mouth daily.      Marland Kitchen lisinopril (PRINIVIL,ZESTRIL) 20 MG tablet Take 20 mg by mouth daily.      . Multiple Vitamin (MULTIVITAMIN) capsule Take 1 capsule by mouth daily.      . vitamin E 100 UNIT capsule Take 100 Units by mouth daily.      Marland Kitchen zinc gluconate 50 MG tablet Take 50 mg by mouth daily.       No current facility-administered medications for this visit.    Allergies as of 11/25/2014 - Review Complete 11/25/2014  Allergen Reaction Noted  . Codeine  08/26/2010    Past Medical History  Diagnosis Date  . PONV (postoperative nausea and vomiting)   . Hypertension   . Thyroid disease     h/o hyperactive, just being followed by endocrinologist    Past Surgical History  Procedure Laterality Date  . Ganglion cyst excision Left     x2-wrist- [facility]  . Abdominal hysterectomy  2000  . Cataract extraction w/phaco Left 03/10/2014    Procedure: CATARACT EXTRACTION PHACO AND INTRAOCULAR LENS PLACEMENT LEFT EYE;  Surgeon: Tonny Branch, MD;  Location: [facility];  Service: Ophthalmology;  Laterality: Left;  CDE 3.50  . Cataract extraction w/phaco Right 04/14/2014   Procedure: CATARACT EXTRACTION PHACO AND INTRAOCULAR LENS PLACEMENT (IOC);  Surgeon: Tonny Branch, MD;  Location: [facility];  Service: Ophthalmology;  Laterality: Right;  CDE 3.35  . Colonoscopy  11/2008    Dr. Gala Romney: normal. Next colonoscopy in 2015 due to h/o polyps by Dr. Tamala Julian and path unknown    Family History  Problem Relation Age of Onset  . Breast cancer Sister   . Colon cancer Neg Hx   . Breast cancer Maternal Grandmother   . Breast cancer Sister   . Stroke Sister   . Stroke Mother     History   Social History  . Marital Status: Divorced    Spouse Name: N/A  . Number of Children: 1  . Years of Education: N/A   Occupational History  . school system    Social History Main Topics  . Smoking status: Never Smoker   . Smokeless tobacco: Not on file     Comment: Never smoked  . Alcohol Use: No  . Drug Use: No  . Sexual Activity: Yes    Birth Control/ Protection: Surgical   Other Topics Concern  . Not on file   Social History Narrative      ROS:  General: Negative for anorexia, weight loss, fever, chills, fatigue, weakness. Eyes: Negative for vision changes.  ENT: Negative for hoarseness, difficulty swallowing , nasal  congestion. CV: Negative for chest pain, angina, palpitations, dyspnea on exertion, peripheral edema.  Respiratory: Negative for dyspnea at rest, dyspnea on exertion, cough, sputum, wheezing.  GI: See history of present illness. GU:  Negative for dysuria, hematuria, urinary incontinence, urinary frequency, nocturnal urination.  MS: Negative for joint pain, low back pain.  Derm: Negative for rash or itching.  Neuro: Negative for weakness, abnormal sensation, seizure, frequent headaches, memory loss, confusion.  Psych: Negative for anxiety, depression, suicidal ideation, hallucinations.  Endo: Negative for unusual weight change.  Heme: Negative for bruising or bleeding. Allergy: Negative for rash or hives.    Physical Examination:  BP 98/60 mmHg   Pulse 88  Temp(Src) 98.2 F (36.8 C) (Oral)  Ht 5\' 5"  (1.651 m)  Wt 121 lb 9.6 oz (55.157 kg)  BMI 20.24 kg/m2   General: Well-nourished, well-developed in no acute distress.  Head: Normocephalic, atraumatic.   Eyes: Conjunctiva pink, no icterus. Mouth: Oropharyngeal mucosa moist and pink , no lesions erythema or exudate. Neck: Supple without thyromegaly, masses, or lymphadenopathy.  Lungs: Clear to auscultation bilaterally.  Heart: Regular rate and rhythm, no murmurs rubs or gallops.  Abdomen: Bowel sounds are normal, nontender, nondistended, no hepatosplenomegaly or masses, no abdominal bruits or    hernia , no rebound or guarding.   Rectal: deferred Extremities: No lower extremity edema. No clubbing or deformities.  Neuro: Alert and oriented x 4 , grossly normal neurologically.  Skin: Warm and dry, no rash or jaundice.   Psych: Alert and cooperative, normal mood and affect.   Imaging Studies: No results found.

## 2014-11-25 NOTE — Patient Instructions (Signed)
Colonoscopy as scheduled.  Please see separate instructions. 

## 2014-11-25 NOTE — Assessment & Plan Note (Addendum)
Due for surveillance colonoscopy.  I have discussed the risks, alternatives, benefits with regards to but not limited to the risk of reaction to medication, bleeding, infection, perforation and the patient is agreeable to proceed. Written consent to be obtained.  Patient may benefit from phenergan or zofran during her procedure given her report of N/V related to anesthesia previously.

## 2014-11-25 NOTE — Addendum Note (Signed)
Addended by: Mahala Menghini on: 11/25/2014 08:31 AM   Modules accepted: Level of Service

## 2014-11-25 NOTE — Progress Notes (Signed)
cc'ed to pcp °

## 2014-12-05 ENCOUNTER — Ambulatory Visit (HOSPITAL_COMMUNITY)
Admission: RE | Admit: 2014-12-05 | Discharge: 2014-12-05 | Disposition: A | Discharge status: Home / Self Care | Payer: BC Managed Care – PPO | Source: Ambulatory Visit | Attending: Family Medicine | Admitting: Family Medicine

## 2014-12-05 DIAGNOSIS — Z1231 Encounter for screening mammogram for malignant neoplasm of breast: Secondary | ICD-10-CM | POA: Diagnosis not present

## 2014-12-12 ENCOUNTER — Encounter (HOSPITAL_COMMUNITY): Payer: Self-pay | Admitting: *Deleted

## 2014-12-12 ENCOUNTER — Ambulatory Visit (HOSPITAL_COMMUNITY)
Admission: RE | Admit: 2014-12-12 | Discharge: 2014-12-12 | Disposition: A | Discharge status: Home / Self Care | Payer: BC Managed Care – PPO | Source: Ambulatory Visit | Attending: Internal Medicine | Admitting: Internal Medicine

## 2014-12-12 ENCOUNTER — Encounter (HOSPITAL_COMMUNITY)
Admission: RE | Disposition: A | Discharge status: Home / Self Care | Payer: Self-pay | Source: Ambulatory Visit | Attending: Internal Medicine

## 2014-12-12 DIAGNOSIS — Z86010 Personal history of colonic polyps: Secondary | ICD-10-CM | POA: Diagnosis not present

## 2014-12-12 DIAGNOSIS — I10 Essential (primary) hypertension: Secondary | ICD-10-CM | POA: Insufficient documentation

## 2014-12-12 DIAGNOSIS — Z1211 Encounter for screening for malignant neoplasm of colon: Secondary | ICD-10-CM | POA: Diagnosis not present

## 2014-12-12 DIAGNOSIS — Z885 Allergy status to narcotic agent status: Secondary | ICD-10-CM | POA: Diagnosis not present

## 2014-12-12 DIAGNOSIS — Z7982 Long term (current) use of aspirin: Secondary | ICD-10-CM | POA: Insufficient documentation

## 2014-12-12 DIAGNOSIS — Z79899 Other long term (current) drug therapy: Secondary | ICD-10-CM | POA: Diagnosis not present

## 2014-12-12 HISTORY — PX: COLONOSCOPY: SHX5424

## 2014-12-12 HISTORY — DX: Polyp of colon: K63.5

## 2014-12-12 SURGERY — COLONOSCOPY
Anesthesia: Moderate Sedation

## 2014-12-12 MED ORDER — MEPERIDINE HCL 100 MG/ML IJ SOLN
INTRAMUSCULAR | Status: DC | PRN
  Administered 2014-12-12: 50 mg via INTRAVENOUS

## 2014-12-12 MED ORDER — MEPERIDINE HCL 100 MG/ML IJ SOLN
INTRAMUSCULAR | Status: AC
  Filled 2014-12-12: qty 2

## 2014-12-12 MED ORDER — MIDAZOLAM HCL 5 MG/5ML IJ SOLN
INTRAMUSCULAR | Status: DC
Start: 2014-12-12 — End: 2014-12-12
  Filled 2014-12-12: qty 10

## 2014-12-12 MED ORDER — ONDANSETRON HCL 4 MG/2ML IJ SOLN
INTRAMUSCULAR | Status: DC | PRN
  Administered 2014-12-12: 4 mg via INTRAVENOUS

## 2014-12-12 MED ORDER — SODIUM CHLORIDE 0.9 % IV SOLN
INTRAVENOUS | Status: DC
  Administered 2014-12-12: 12:00:00 via INTRAVENOUS

## 2014-12-12 MED ORDER — STERILE WATER FOR IRRIGATION IR SOLN
Status: DC | PRN
  Administered 2014-12-12: 13:00:00

## 2014-12-12 MED ORDER — ONDANSETRON HCL 4 MG/2ML IJ SOLN
INTRAMUSCULAR | Status: AC
  Filled 2014-12-12: qty 2

## 2014-12-12 MED ORDER — MIDAZOLAM HCL 5 MG/5ML IJ SOLN
INTRAMUSCULAR | Status: DC | PRN
  Administered 2014-12-12 (×2): 1 mg via INTRAVENOUS
  Administered 2014-12-12: 2 mg via INTRAVENOUS

## 2014-12-12 NOTE — Discharge Instructions (Signed)

## 2014-12-12 NOTE — Interval H&P Note (Signed)
History and Physical Interval Note:  12/12/2014 12:26 PM  Patricia Nunez  has presented today for surgery, with the diagnosis of history of colon polyps  The various methods of treatment have been discussed with the patient and family. After consideration of risks, benefits and other options for treatment, the patient has consented to  Procedure(s) with comments: COLONOSCOPY (N/A) - 1230 as a surgical intervention .  The patient's history has been reviewed, patient examined, no change in status, stable for surgery.  I have reviewed the patient's chart and labs.  Questions were answered to the patient's satisfaction.     Patricia Nunez  No change. Surveillance colonoscopy per plan. The risks, benefits, limitations, alternatives and imponderables have been reviewed with the patient. Questions have been answered. All parties are agreeable.

## 2014-12-12 NOTE — Op Note (Signed)
[facility] [address] [place], [address]   COLONOSCOPY PROCEDURE REPORT  PATIENT: Patricia Nunez, Patricia Nunez  MR#: [id number] BIRTHDATE: Nov 24, 1955 , 59  yrs. old GENDER: female ENDOSCOPIST: R.  Garfield Cornea, MD FACP [facility] REFERRED PJ:PETKKO Hill, M.D. PROCEDURE DATE:  12-13-14 PROCEDURE:   Colonoscopy, surveillance INDICATIONS:History of colonic polyps. MEDICATIONS: Versed 4 mg IV and Demerol 50 mg IV in divided doses. Zofran 4 mg IV. ASA CLASS:       Class II  CONSENT: The risks, benefits, alternatives and imponderables including but not limited to bleeding, perforation as well as the possibility of a missed lesion have been reviewed.  The potential for biopsy, lesion removal, etc. have also been discussed. Questions have been answered.  All parties agreeable.  Please see the history and physical in the medical record for more information.  DESCRIPTION OF PROCEDURE:   After the risks benefits and alternatives of the procedure were thoroughly explained, informed consent was obtained.  The digital rectal exam revealed no abnormalities of the rectum.   The EC-[id number] ([id number])  endoscope was introduced through the anus and advanced to the cecum, which was identified by both the appendix and ileocecal valve.      The quality of the prep was adequate  The instrument was then slowly withdrawn as the colon was fully examined. Estimated blood loss is zero unless otherwise noted in this procedure report.      COLON FINDINGS: Normal-appearing rectal mucosa.  Normal appearing colonic mucosa.  Retroflexion was performed. .  Withdrawal time=7 minutes 0 seconds.  The scope was withdrawn and the procedure completed. COMPLICATIONS: There were no immediate complications.  ENDOSCOPIC IMPRESSION: Normal colonoscopy  RECOMMENDATIONS: Repeat screening colonoscopy in 5 years  eSigned:  R. Garfield Cornea, MD Rosalita Chessman [facility] 12/13/14 12:57 PM   cc:  CPT CODES: ICD  CODES:  The ICD and CPT codes recommended by this software are interpretations from the data that the clinical staff has captured with the software.  The verification of the translation of this report to the ICD and CPT codes and modifiers is the sole responsibility of the health care institution and practicing physician where this report was generated.  [employer] will not be held responsible for the validity of the ICD and CPT codes included on this report.  AMA assumes no liability for data contained or not contained herein. CPT is a Designer, television/film set of the [employer].

## 2014-12-12 NOTE — H&P (View-Only) (Signed)
Primary Care Physician:  Maggie Font, MD  Primary Gastroenterologist:  Garfield Cornea, MD   Chief Complaint  Patient presents with  . OTHER    colonoscopy/ hx of polyps/ no problems    HPI:  Patricia Nunez is a 59 y.o. female here to schedule surveillance colonoscopy. She was due last year. She has a history of colon polyps removed by Dr. Tamala Julian in the past, pathology unavailable. Last colonoscopy in August 2010 by Dr. Gala Romney, normal. Doing well. No constipation, diarrhea, abdominal pain, unintentional weight loss, heartburn, nausea or vomiting, dysphagia.  Current Outpatient Prescriptions  Medication Sig Dispense Refill  . aspirin 81 MG tablet Take 81 mg by mouth daily.      . calcium carbonate (OS-CAL) 600 MG TABS Take 600 mg by mouth daily with breakfast.     . Cholecalciferol (VITAMIN D) 400 UNITS capsule Take 400 Units by mouth daily.      Marland Kitchen lisinopril (PRINIVIL,ZESTRIL) 20 MG tablet Take 20 mg by mouth daily.      . Multiple Vitamin (MULTIVITAMIN) capsule Take 1 capsule by mouth daily.      . vitamin E 100 UNIT capsule Take 100 Units by mouth daily.      Marland Kitchen zinc gluconate 50 MG tablet Take 50 mg by mouth daily.       No current facility-administered medications for this visit.    Allergies as of 11/25/2014 - Review Complete 11/25/2014  Allergen Reaction Noted  . Codeine  08/26/2010    Past Medical History  Diagnosis Date  . PONV (postoperative nausea and vomiting)   . Hypertension   . Thyroid disease     h/o hyperactive, just being followed by endocrinologist    Past Surgical History  Procedure Laterality Date  . Ganglion cyst excision Left     x2-wrist- [facility]  . Abdominal hysterectomy  2000  . Cataract extraction w/phaco Left 03/10/2014    Procedure: CATARACT EXTRACTION PHACO AND INTRAOCULAR LENS PLACEMENT LEFT EYE;  Surgeon: Tonny Branch, MD;  Location: [facility];  Service: Ophthalmology;  Laterality: Left;  CDE 3.50  . Cataract extraction w/phaco Right 04/14/2014   Procedure: CATARACT EXTRACTION PHACO AND INTRAOCULAR LENS PLACEMENT (IOC);  Surgeon: Tonny Branch, MD;  Location: [facility];  Service: Ophthalmology;  Laterality: Right;  CDE 3.35  . Colonoscopy  11/2008    Dr. Gala Romney: normal. Next colonoscopy in 2015 due to h/o polyps by Dr. Tamala Julian and path unknown    Family History  Problem Relation Age of Onset  . Breast cancer Sister   . Colon cancer Neg Hx   . Breast cancer Maternal Grandmother   . Breast cancer Sister   . Stroke Sister   . Stroke Mother     History   Social History  . Marital Status: Divorced    Spouse Name: N/A  . Number of Children: 1  . Years of Education: N/A   Occupational History  . school system    Social History Main Topics  . Smoking status: Never Smoker   . Smokeless tobacco: Not on file     Comment: Never smoked  . Alcohol Use: No  . Drug Use: No  . Sexual Activity: Yes    Birth Control/ Protection: Surgical   Other Topics Concern  . Not on file   Social History Narrative      ROS:  General: Negative for anorexia, weight loss, fever, chills, fatigue, weakness. Eyes: Negative for vision changes.  ENT: Negative for hoarseness, difficulty swallowing , nasal  congestion. CV: Negative for chest pain, angina, palpitations, dyspnea on exertion, peripheral edema.  Respiratory: Negative for dyspnea at rest, dyspnea on exertion, cough, sputum, wheezing.  GI: See history of present illness. GU:  Negative for dysuria, hematuria, urinary incontinence, urinary frequency, nocturnal urination.  MS: Negative for joint pain, low back pain.  Derm: Negative for rash or itching.  Neuro: Negative for weakness, abnormal sensation, seizure, frequent headaches, memory loss, confusion.  Psych: Negative for anxiety, depression, suicidal ideation, hallucinations.  Endo: Negative for unusual weight change.  Heme: Negative for bruising or bleeding. Allergy: Negative for rash or hives.    Physical Examination:  BP 98/60 mmHg   Pulse 88  Temp(Src) 98.2 F (36.8 C) (Oral)  Ht 5\' 5"  (1.651 m)  Wt 121 lb 9.6 oz (55.157 kg)  BMI 20.24 kg/m2   General: Well-nourished, well-developed in no acute distress.  Head: Normocephalic, atraumatic.   Eyes: Conjunctiva pink, no icterus. Mouth: Oropharyngeal mucosa moist and pink , no lesions erythema or exudate. Neck: Supple without thyromegaly, masses, or lymphadenopathy.  Lungs: Clear to auscultation bilaterally.  Heart: Regular rate and rhythm, no murmurs rubs or gallops.  Abdomen: Bowel sounds are normal, nontender, nondistended, no hepatosplenomegaly or masses, no abdominal bruits or    hernia , no rebound or guarding.   Rectal: deferred Extremities: No lower extremity edema. No clubbing or deformities.  Neuro: Alert and oriented x 4 , grossly normal neurologically.  Skin: Warm and dry, no rash or jaundice.   Psych: Alert and cooperative, normal mood and affect.   Imaging Studies: No results found.

## 2014-12-17 ENCOUNTER — Encounter (HOSPITAL_COMMUNITY): Payer: Self-pay | Admitting: Internal Medicine

## 2015-09-28 ENCOUNTER — Other Ambulatory Visit (HOSPITAL_COMMUNITY): Payer: Self-pay | Admitting: Obstetrics and Gynecology

## 2015-09-28 DIAGNOSIS — N644 Mastodynia: Secondary | ICD-10-CM

## 2015-10-13 ENCOUNTER — Ambulatory Visit (HOSPITAL_COMMUNITY)
Admission: RE | Admit: 2015-10-13 | Discharge: 2015-10-13 | Disposition: A | Discharge status: Home / Self Care | Payer: BC Managed Care – PPO | Source: Ambulatory Visit | Attending: Obstetrics and Gynecology | Admitting: Obstetrics and Gynecology

## 2015-10-13 DIAGNOSIS — N644 Mastodynia: Secondary | ICD-10-CM | POA: Diagnosis not present

## 2015-10-13 DIAGNOSIS — Z803 Family history of malignant neoplasm of breast: Secondary | ICD-10-CM | POA: Insufficient documentation

## 2016-10-10 ENCOUNTER — Other Ambulatory Visit (HOSPITAL_COMMUNITY): Payer: Self-pay | Admitting: Obstetrics and Gynecology

## 2016-10-10 DIAGNOSIS — Z1231 Encounter for screening mammogram for malignant neoplasm of breast: Secondary | ICD-10-CM

## 2016-10-17 ENCOUNTER — Ambulatory Visit (HOSPITAL_COMMUNITY)
Admission: RE | Admit: 2016-10-17 | Discharge: 2016-10-17 | Disposition: A | Discharge status: Home / Self Care | Payer: BC Managed Care – PPO | Source: Ambulatory Visit | Attending: Obstetrics and Gynecology | Admitting: Obstetrics and Gynecology

## 2016-10-17 DIAGNOSIS — Z1231 Encounter for screening mammogram for malignant neoplasm of breast: Secondary | ICD-10-CM

## 2018-04-18 HISTORY — PX: BREAST LUMPECTOMY: SHX2

## 2018-06-19 ENCOUNTER — Other Ambulatory Visit: Payer: Self-pay | Admitting: Family Medicine

## 2018-06-19 ENCOUNTER — Ambulatory Visit
Admission: RE | Admit: 2018-06-19 | Discharge: 2018-06-19 | Disposition: A | Discharge status: Home / Self Care | Payer: BC Managed Care – PPO | Source: Ambulatory Visit | Attending: Family Medicine | Admitting: Family Medicine

## 2018-06-19 DIAGNOSIS — R059 Cough, unspecified: Secondary | ICD-10-CM

## 2018-06-19 DIAGNOSIS — R05 Cough: Secondary | ICD-10-CM

## 2019-01-11 ENCOUNTER — Other Ambulatory Visit: Payer: Self-pay | Admitting: Obstetrics and Gynecology

## 2019-01-11 DIAGNOSIS — N6320 Unspecified lump in the left breast, unspecified quadrant: Secondary | ICD-10-CM

## 2019-01-16 ENCOUNTER — Ambulatory Visit
Admission: RE | Admit: 2019-01-16 | Discharge: 2019-01-16 | Disposition: A | Discharge status: Home / Self Care | Payer: BC Managed Care – PPO | Source: Ambulatory Visit | Attending: Obstetrics and Gynecology | Admitting: Obstetrics and Gynecology

## 2019-01-16 ENCOUNTER — Ambulatory Visit
Admission: RE | Admit: 2019-01-16 | Discharge: 2019-01-16 | Disposition: A | Discharge status: Home / Self Care | Payer: Medicare Other | Source: Ambulatory Visit | Attending: Obstetrics and Gynecology | Admitting: Obstetrics and Gynecology

## 2019-01-16 ENCOUNTER — Other Ambulatory Visit: Payer: Self-pay | Admitting: Obstetrics and Gynecology

## 2019-01-16 ENCOUNTER — Other Ambulatory Visit: Payer: Self-pay

## 2019-01-16 DIAGNOSIS — N6320 Unspecified lump in the left breast, unspecified quadrant: Secondary | ICD-10-CM

## 2019-01-17 ENCOUNTER — Inpatient Hospital Stay: Admission: RE | Admit: 2019-01-17 | Payer: Medicare Other | Source: Ambulatory Visit

## 2019-01-18 ENCOUNTER — Ambulatory Visit
Admission: RE | Admit: 2019-01-18 | Discharge: 2019-01-18 | Disposition: A | Discharge status: Home / Self Care | Payer: Medicare Other | Source: Ambulatory Visit | Attending: Obstetrics and Gynecology | Admitting: Obstetrics and Gynecology

## 2019-01-18 ENCOUNTER — Other Ambulatory Visit: Payer: Self-pay

## 2019-01-18 DIAGNOSIS — N6320 Unspecified lump in the left breast, unspecified quadrant: Secondary | ICD-10-CM

## 2019-01-22 ENCOUNTER — Encounter: Payer: Self-pay | Admitting: *Deleted

## 2019-01-22 DIAGNOSIS — Z170 Estrogen receptor positive status [ER+]: Secondary | ICD-10-CM | POA: Insufficient documentation

## 2019-01-22 DIAGNOSIS — C50412 Malignant neoplasm of upper-outer quadrant of left female breast: Secondary | ICD-10-CM | POA: Insufficient documentation

## 2019-01-23 ENCOUNTER — Other Ambulatory Visit: Payer: Self-pay | Admitting: *Deleted

## 2019-01-23 DIAGNOSIS — Z170 Estrogen receptor positive status [ER+]: Secondary | ICD-10-CM

## 2019-01-23 DIAGNOSIS — C50412 Malignant neoplasm of upper-outer quadrant of left female breast: Secondary | ICD-10-CM

## 2019-01-29 NOTE — Progress Notes (Signed)
[place] NOTE  Patient Care Team: Iona Beard, MD as PCP - General (Family Medicine) Mauro Kaufmann, RN as Oncology Nurse Navigator Rockwell Germany, RN as Oncology Nurse Navigator Erroll Luna, MD as Consulting Physician (General Surgery) Nicholas Lose, MD as Consulting Physician (Hematology and Oncology) Gery Pray, MD as Consulting Physician (Radiation Oncology)  CHIEF COMPLAINTS/PURPOSE OF CONSULTATION:  Newly diagnosed breast cancer  HISTORY OF PRESENTING ILLNESS:  Patricia Nunez 63 y.o. female is here because of recent diagnosis of invasive ductal carcinoma of the left breast. The cancer was detected on a diagnostic mammogram on 01/16/19 after the patient palpated a left breast mass. It showed a 0.9cm mass in the left breast at the 3 o'clock position and no left axillary adenopathy. Biopsy on 01/18/19 showed invasive ductal carcinoma, grade 2, HER-2 negative by FISH, ER+ 95%, PR+ 95%, Ki67 15%. She presents to the clinic today for initial evaluation and discussion of treatment options.   I reviewed her records extensively and collaborated the history with the patient.  SUMMARY OF ONCOLOGIC HISTORY: Oncology History  Malignant neoplasm of upper-outer quadrant of left breast in female, estrogen receptor positive ([place])  01/22/2019 Initial Diagnosis   Patient palpated a left breast mass. Mammogram showed a 0.9cm mass in the left breast at the 3 o'clock position, no left axillary adenopathy. 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      MEDICAL HISTORY:  Past Medical History:  Diagnosis Date  . Colon polyps   . Hypertension   . PONV (postoperative nausea and vomiting)   . Thyroid disease    h/o hyperactive, just being followed by endocrinologist    SURGICAL HISTORY: Past Surgical History:  Procedure Laterality Date  . ABDOMINAL HYSTERECTOMY  2000  . CATARACT EXTRACTION W/PHACO Left 03/10/2014   Procedure:  CATARACT EXTRACTION PHACO AND INTRAOCULAR LENS PLACEMENT LEFT EYE;  Surgeon: Tonny Branch, MD;  Location: [facility];  Service: Ophthalmology;  Laterality: Left;  CDE 3.50  . CATARACT EXTRACTION W/PHACO Right 04/14/2014   Procedure: CATARACT EXTRACTION PHACO AND INTRAOCULAR LENS PLACEMENT (IOC);  Surgeon: Tonny Branch, MD;  Location: [facility];  Service: Ophthalmology;  Laterality: Right;  CDE 3.35  . COLONOSCOPY  11/2008   Dr. Gala Romney: normal. Next colonoscopy in 2015 due to h/o polyps by Dr. Tamala Julian and path unknown  . COLONOSCOPY N/A 12/12/2014   Procedure: COLONOSCOPY;  Surgeon: Daneil Dolin, MD;  Location: [facility];  Service: Endoscopy;  Laterality: N/A;  1230  . GANGLION CYST EXCISION Left    x2-wrist- [facility]    SOCIAL HISTORY: Social History   Socioeconomic History  . Marital status: Divorced    Spouse name: Not on file  . Number of children: 1  . Years of education: Not on file  . Highest education level: Not on file  Occupational History  . Occupation: school system  Social Needs  . Financial resource strain: Not on file  . Food insecurity    Worry: Not on file    Inability: Not on file  . Transportation needs    Medical: Not on file    Non-medical: Not on file  Tobacco Use  . Smoking status: Never Smoker  . Tobacco comment: Never smoked  Substance and Sexual Activity  . Alcohol use: No    Alcohol/week: 0.0 standard drinks  . Drug use: No  . Sexual activity: Yes    Birth control/protection: Surgical  Lifestyle  .  Physical activity    Days per week: Not on file    Minutes per session: Not on file  . Stress: Not on file  Relationships  . Social Herbalist on phone: Not on file    Gets together: Not on file    Attends religious service: Not on file    Active member of club or organization: Not on file    Attends meetings of clubs or organizations: Not on file    Relationship status: Not on file  . Intimate partner violence    Fear of current or ex partner:  Not on file    Emotionally abused: Not on file    Physically abused: Not on file    Forced sexual activity: Not on file  Other Topics Concern  . Not on file  Social History Narrative  . Not on file    FAMILY HISTORY: Family History  Problem Relation Age of Onset  . Breast cancer Sister   . Breast cancer Maternal Grandmother   . Breast cancer Sister   . Stroke Sister   . Stroke Mother   . Colon cancer Neg Hx     ALLERGIES:  is allergic to codeine.  MEDICATIONS:  Current Outpatient Medications  Medication Sig Dispense Refill  . aspirin 81 MG tablet Take 81 mg by mouth daily.      . calcium carbonate (OS-CAL) 600 MG TABS Take 600 mg by mouth daily with breakfast.     . Cholecalciferol (VITAMIN D) 400 UNITS capsule Take 400 Units by mouth daily.      Marland Kitchen lisinopril (PRINIVIL,ZESTRIL) 20 MG tablet Take 20 mg by mouth daily.      . Multiple Vitamin (MULTIVITAMIN) capsule Take 1 capsule by mouth daily.      . polyethylene glycol-electrolytes (NULYTELY/GOLYTELY) 420 G solution Take 4,000 mLs by mouth once. 4000 mL 0  . vitamin E 100 UNIT capsule Take 100 Units by mouth daily.       No current facility-administered medications for this visit.     REVIEW OF SYSTEMS:   Constitutional: Denies fevers, chills or abnormal night sweats Eyes: Denies blurriness of vision, double vision or watery eyes Ears, nose, mouth, throat, and face: Denies mucositis or sore throat Respiratory: Denies cough, dyspnea or wheezes Cardiovascular: Denies palpitation, chest discomfort or lower extremity swelling Gastrointestinal:  Denies nausea, heartburn or change in bowel habits Skin: Denies abnormal skin rashes Lymphatics: Denies new lymphadenopathy or easy bruising Neurological:Denies numbness, tingling or new weaknesses Behavioral/Psych: Mood is stable, no new changes  Breast: Palpable left breast mass All other systems were reviewed with the patient and are negative.  PHYSICAL EXAMINATION: ECOG  PERFORMANCE STATUS: 1 - Symptomatic but completely ambulatory  Vitals:   01/30/19 1040  BP: (!) 141/77  Pulse: (!) 112  Resp: 18  Temp: 98.7 F (37.1 C)  SpO2: 99%   Filed Weights   01/30/19 1040  Weight: 115 lb 12.8 oz (52.5 kg)    GENERAL:alert, no distress and comfortable SKIN: skin color, texture, turgor are normal, no rashes or significant lesions EYES: normal, conjunctiva are pink and non-injected, sclera clear OROPHARYNX:no exudate, no erythema and lips, buccal mucosa, and tongue normal  NECK: supple, thyroid normal size, non-tender, without nodularity LYMPH:  no palpable lymphadenopathy in the cervical, axillary or inguinal LUNGS: clear to auscultation and percussion with normal breathing effort HEART: regular rate & rhythm and no murmurs and no lower extremity edema ABDOMEN:abdomen soft, non-tender and normal bowel sounds Musculoskeletal:no  cyanosis of digits and no clubbing  PSYCH: alert & oriented x 3 with fluent speech NEURO: no focal motor/sensory deficits BREAST: Left breast lump. No palpable axillary or supraclavicular lymphadenopathy (exam performed in the presence of a chaperone)   LABORATORY DATA:  I have reviewed the data as listed Lab Results  Component Value Date   HGB 12.4 03/05/2014   HCT 37.2 03/05/2014   Lab Results  Component Value Date   NA 141 03/05/2014   K 4.5 03/05/2014   CL 102 03/05/2014   CO2 28 03/05/2014    RADIOGRAPHIC STUDIES: I have personally reviewed the radiological reports and agreed with the findings in the report.  ASSESSMENT AND PLAN:  Malignant neoplasm of upper-outer quadrant of left breast in female, estrogen receptor positive ([place]) 01/22/2019:Patient palpated a left breast mass. Mammogram showed a 0.9cm mass in the left breast at the 3 o'clock position, no left axillary adenopathy. 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  T1BN0 stage Ia  Pathology and radiology counseling:Discussed  with the patient, the details of pathology including the type of breast cancer,the clinical staging, the significance of ER, PR and HER-2/neu receptors and the implications for treatment. After reviewing the pathology in detail, we proceeded to discuss the different treatment options between surgery, radiation, chemotherapy, antiestrogen therapies.  Recommendations: 1. Breast conserving surgery followed by 2. Oncotype DX testing to determine if chemotherapy would be of any benefit followed by 3. Adjuvant radiation therapy followed by 4. Adjuvant antiestrogen therapy 5.  Genetics consult  Oncotype counseling: I discussed Oncotype DX test. I explained to the patient that this is a 21 gene panel to evaluate patient tumors DNA to calculate recurrence score. This would help determine whether patient has high risk or intermediate risk or low risk breast cancer. She understands that if her tumor was found to be high risk, she would benefit from systemic chemotherapy. If low risk, no need of chemotherapy. If she was found to be intermediate risk, we would need to evaluate the score as well as other risk factors and determine if an abbreviated chemotherapy may be of benefit.  Return to clinic after surgery to discuss final pathology report and then determine if Oncotype DX testing will need to be sent.     All questions were answered. The patient knows to call the clinic with any problems, questions or concerns.   Rulon Eisenmenger, MD 01/30/2019    I, Molly Dorshimer, am acting as scribe for Nicholas Lose, MD.  I have reviewed the above documentation for accuracy and completeness, and I agree with the above.

## 2019-01-30 ENCOUNTER — Encounter: Payer: Self-pay | Admitting: Genetic Counselor

## 2019-01-30 ENCOUNTER — Ambulatory Visit: Payer: Self-pay | Admitting: Surgery

## 2019-01-30 ENCOUNTER — Telehealth: Payer: Self-pay | Admitting: Genetic Counselor

## 2019-01-30 ENCOUNTER — Inpatient Hospital Stay: Payer: Medicare Other

## 2019-01-30 ENCOUNTER — Encounter: Payer: Self-pay | Admitting: Physical Therapy

## 2019-01-30 ENCOUNTER — Encounter: Payer: Self-pay | Admitting: Hematology and Oncology

## 2019-01-30 ENCOUNTER — Ambulatory Visit
Admission: RE | Admit: 2019-01-30 | Discharge: 2019-01-30 | Disposition: A | Discharge status: Home / Self Care | Payer: Medicare Other | Source: Ambulatory Visit | Attending: Radiation Oncology | Admitting: Radiation Oncology

## 2019-01-30 ENCOUNTER — Encounter: Payer: Self-pay | Admitting: *Deleted

## 2019-01-30 ENCOUNTER — Inpatient Hospital Stay: Payer: Medicare Other | Attending: Hematology and Oncology | Admitting: Hematology and Oncology

## 2019-01-30 ENCOUNTER — Other Ambulatory Visit: Payer: Self-pay

## 2019-01-30 ENCOUNTER — Ambulatory Visit: Payer: Medicare Other | Attending: Surgery | Admitting: Physical Therapy

## 2019-01-30 ENCOUNTER — Ambulatory Visit (HOSPITAL_BASED_OUTPATIENT_CLINIC_OR_DEPARTMENT_OTHER): Payer: Medicare Other | Admitting: Genetic Counselor

## 2019-01-30 DIAGNOSIS — C50412 Malignant neoplasm of upper-outer quadrant of left female breast: Secondary | ICD-10-CM

## 2019-01-30 DIAGNOSIS — Z170 Estrogen receptor positive status [ER+]: Secondary | ICD-10-CM | POA: Diagnosis present

## 2019-01-30 DIAGNOSIS — R293 Abnormal posture: Secondary | ICD-10-CM | POA: Diagnosis present

## 2019-01-30 DIAGNOSIS — C50912 Malignant neoplasm of unspecified site of left female breast: Secondary | ICD-10-CM

## 2019-01-30 DIAGNOSIS — Z806 Family history of leukemia: Secondary | ICD-10-CM

## 2019-01-30 DIAGNOSIS — Z803 Family history of malignant neoplasm of breast: Secondary | ICD-10-CM

## 2019-01-30 LAB — CBC WITH DIFFERENTIAL (CANCER CENTER ONLY)
Abs Immature Granulocytes: 0.02 10*3/uL (ref 0.00–0.07)
Basophils Absolute: 0.1 10*3/uL (ref 0.0–0.1)
Basophils Relative: 2 %
Eosinophils Absolute: 0.5 10*3/uL (ref 0.0–0.5)
Eosinophils Relative: 11 %
HCT: 40.2 % (ref 36.0–46.0)
Hemoglobin: 13 g/dL (ref 12.0–15.0)
Immature Granulocytes: 0 %
Lymphocytes Relative: 31 %
Lymphs Abs: 1.5 10*3/uL (ref 0.7–4.0)
MCH: 29.2 pg (ref 26.0–34.0)
MCHC: 32.3 g/dL (ref 30.0–36.0)
MCV: 90.3 fL (ref 80.0–100.0)
Monocytes Absolute: 0.5 10*3/uL (ref 0.1–1.0)
Monocytes Relative: 11 %
Neutro Abs: 2.2 10*3/uL (ref 1.7–7.7)
Neutrophils Relative %: 45 %
Platelet Count: 255 10*3/uL (ref 150–400)
RBC: 4.45 MIL/uL (ref 3.87–5.11)
RDW: 12.4 % (ref 11.5–15.5)
WBC Count: 4.8 10*3/uL (ref 4.0–10.5)
nRBC: 0 % (ref 0.0–0.2)

## 2019-01-30 LAB — CMP (CANCER CENTER ONLY)
ALT: 17 U/L (ref 0–44)
AST: 19 U/L (ref 15–41)
Albumin: 4.4 g/dL (ref 3.5–5.0)
Alkaline Phosphatase: 55 U/L (ref 38–126)
Anion gap: 9 (ref 5–15)
BUN: 11 mg/dL (ref 8–23)
CO2: 28 mmol/L (ref 22–32)
Calcium: 9.3 mg/dL (ref 8.9–10.3)
Chloride: 103 mmol/L (ref 98–111)
Creatinine: 0.74 mg/dL (ref 0.44–1.00)
GFR, Est AFR Am: >60 mL/min (ref >60)
GFR, Estimated: >60 mL/min (ref >60)
Glucose, Bld: 106 mg/dL — ABNORMAL HIGH (ref 70–99)
Potassium: 4.3 mmol/L (ref 3.5–5.1)
Sodium: 140 mmol/L (ref 135–145)
Total Bilirubin: 0.3 mg/dL (ref 0.3–1.2)
Total Protein: 7.2 g/dL (ref 6.5–8.1)

## 2019-01-30 NOTE — Patient Instructions (Signed)

## 2019-01-30 NOTE — Assessment & Plan Note (Signed)
01/22/2019:Patient palpated a left breast mass. Mammogram showed a 0.9cm mass in the left breast at the 3 o'clock position, no left axillary adenopathy. 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  T1BN0 stage Ia  Pathology and radiology counseling:Discussed with the patient, the details of pathology including the type of breast cancer,the clinical staging, the significance of ER, PR and HER-2/neu receptors and the implications for treatment. After reviewing the pathology in detail, we proceeded to discuss the different treatment options between surgery, radiation, chemotherapy, antiestrogen therapies.  Recommendations: 1. Breast conserving surgery followed by 2. Oncotype DX testing to determine if chemotherapy would be of any benefit followed by 3. Adjuvant radiation therapy followed by 4. Adjuvant antiestrogen therapy 5.  Genetics consult  Oncotype counseling: I discussed Oncotype DX test. I explained to the patient that this is a 21 gene panel to evaluate patient tumors DNA to calculate recurrence score. This would help determine whether patient has high risk or intermediate risk or low risk breast cancer. She understands that if her tumor was found to be high risk, she would benefit from systemic chemotherapy. If low risk, no need of chemotherapy. If she was found to be intermediate risk, we would need to evaluate the score as well as other risk factors and determine if an abbreviated chemotherapy may be of benefit.  Return to clinic after surgery to discuss final pathology report and then determine if Oncotype DX testing will need to be sent.

## 2019-01-30 NOTE — Progress Notes (Signed)
REFERRING PROVIDER: Nicholas Lose, MD [place],  [place] [id number]  PRIMARY PROVIDER:  Iona Beard, MD  PRIMARY REASON FOR VISIT:  1. Malignant neoplasm of upper-outer quadrant of left breast in female, estrogen receptor positive ([place])   2. Family history of breast cancer   3. Family history of leukemia    I connected with Patricia Nunez on 01/30/2019 at 12:00 pm EDT by Webex video conference and verified that I am speaking with the correct person using two identifiers.   Patient location: clinic Provider location: office   HISTORY OF PRESENT ILLNESS:   Patricia Nunez, a 63 y.o. female, was seen for a [facility] cancer genetics consultation at the request of Dr. Lindi Adie due to a personal and family history of breast cancer.  Patricia Nunez presents to clinic today to discuss the possibility of a hereditary predisposition to cancer, genetic testing, and to further clarify her future cancer risks, as well as potential cancer risks for family members.   In 2020, at the age of 21, Patricia Nunez was diagnosed with IDC, ER+/PR+/Her2-, of the left breast.   CANCER HISTORY:  Oncology History  Malignant neoplasm of upper-outer quadrant of left breast in female, estrogen receptor positive ([place])  01/22/2019 Initial Diagnosis   Patient palpated a left breast mass. Mammogram showed a 0.9cm mass in the left breast at the 3 o'clock position, no left axillary adenopathy. 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       RISK FACTORS:  Menarche was at age 43.  First live birth at age 74.  OCP use for approximately 10 years.  Ovaries intact: no.  Hysterectomy: yes.  Menopausal status: postmenopausal.  HRT use: 0 years. Colonoscopy: yes; every five years, per patient. A few polyps found on her first colonoscopy.. Mammogram within the last year: yes. Number of breast biopsies: 1.  Past Medical History:  Diagnosis Date  . Colon polyps   . Family history  of breast cancer   . Family history of leukemia   . Hypertension   . PONV (postoperative nausea and vomiting)   . Thyroid disease    h/o hyperactive, just being followed by endocrinologist    Past Surgical History:  Procedure Laterality Date  . ABDOMINAL HYSTERECTOMY  2000  . CATARACT EXTRACTION W/PHACO Left 03/10/2014   Procedure: CATARACT EXTRACTION PHACO AND INTRAOCULAR LENS PLACEMENT LEFT EYE;  Surgeon: Tonny Branch, MD;  Location: [facility];  Service: Ophthalmology;  Laterality: Left;  CDE 3.50  . CATARACT EXTRACTION W/PHACO Right 04/14/2014   Procedure: CATARACT EXTRACTION PHACO AND INTRAOCULAR LENS PLACEMENT (IOC);  Surgeon: Tonny Branch, MD;  Location: [facility];  Service: Ophthalmology;  Laterality: Right;  CDE 3.35  . COLONOSCOPY  11/2008   Dr. Gala Romney: normal. Next colonoscopy in 2015 due to h/o polyps by Dr. Tamala Julian and path unknown  . COLONOSCOPY N/A 12/12/2014   Procedure: COLONOSCOPY;  Surgeon: Daneil Dolin, MD;  Location: [facility];  Service: Endoscopy;  Laterality: N/A;  1230  . GANGLION CYST EXCISION Left    x2-wrist- [place]    Social History   Socioeconomic History  . Marital status: Divorced    Spouse name: Not on file  . Number of children: 1  . Years of education: Not on file  . Highest education level: Not on file  Occupational History  . Occupation: school system  Social Needs  . Financial resource strain: Not on file  . Food insecurity  Worry: Not on file    Inability: Not on file  . Transportation needs    Medical: Not on file    Non-medical: Not on file  Tobacco Use  . Smoking status: Never Smoker  . Tobacco comment: Never smoked  Substance and Sexual Activity  . Alcohol use: No    Alcohol/week: 0.0 standard drinks  . Drug use: No  . Sexual activity: Yes    Birth control/protection: Surgical  Lifestyle  . Physical activity    Days per week: Not on file    Minutes per session: Not on file  . Stress: Not on file  Relationships  . Social  Herbalist on phone: Not on file    Gets together: Not on file    Attends religious service: Not on file    Active member of club or organization: Not on file    Attends meetings of clubs or organizations: Not on file    Relationship status: Not on file  Other Topics Concern  . Not on file  Social History Narrative  . Not on file     FAMILY HISTORY:  We obtained a detailed, 4-generation family history.  Significant diagnoses are listed below: Family History  Problem Relation Age of Onset  . Breast cancer Sister        diagnosed in her 58s  . Stroke Sister   . Breast cancer Maternal Grandmother        diagnosed in her 74s or 64s  . Breast cancer Sister        diagnosed in her 37s  . Stroke Mother   . Leukemia Maternal Aunt        diagnosed in her 56s  . Colon cancer Neg Hx      Patricia Nunez has one son who is 77 and has not had cancer. She has six sisters and four brothers. Four of her sisters have died, and two of them had breast cancer that was diagnosed in their 60s. None of her other siblings have had cancer.  Patricia Nunez mother died at age 61 from a stroke. She had five maternal aunts and four maternal uncles. One of her aunts was diagnosed with leukemia in her 48s. Her other aunts and uncles died when they were older than 72. Patricia Nunez grandmother had breast cancer diagnosed in her 54s or 82s, and she does not have any information about her paternal grandfather.  Patricia Nunez father died at the age of 6 from a heart attack. She had five paternal aunts and one paternal uncle, none of whom had cancer. Her paternal grandmother died older than 51 form pneumonia, and she does not have information about her paternal grandfather. There are no known diagnoses of cancer on the paternal side of the family.  Patricia Nunez is unaware of previous family history of genetic testing for hereditary cancer risks. Patient's maternal ancestors are of [place] American and Caucasian  descent, and paternal ancestors are of African American descent. There is no reported Ashkenazi Jewish ancestry. There is no known consanguinity.  GENETIC COUNSELING ASSESSMENT: Patricia Nunez is a 63 y.o. female with a personal and family history of breast cancer which is somewhat suggestive of a hereditary cancer syndrome and predisposition to cancer. We, therefore, discussed and recommended the following at today's visit.   DISCUSSION: We discussed that 5 - 10% of breast cancer is hereditary, with most cases associated with BRCA1/2.  There are other genes that can be associated with  hereditary breast cancer syndromes.  These include ATM, CHEK2, PALB2, etc.  We discussed that testing is beneficial for several reasons including knowing about other cancer risks, identifying potential screening and risk-reduction options that may be appropriate, and to understand if other family members could be at risk for cancer and allow them to undergo genetic testing.   We reviewed the characteristics, features and inheritance patterns of hereditary cancer syndromes. We also discussed genetic testing, including the appropriate family members to test, the process of testing, insurance coverage and turn-around-time for results. We discussed the implications of a negative, positive and/or variant of uncertain significant result. In order to get genetic test results in a timely manner so that Patricia Nunez can use these genetic test results for surgical decisions, we recommended Patricia Nunez pursue genetic testing for the [facility] Breast Cancer STAT panel. Once complete, we recommend Patricia Nunez pursue reflex genetic testing to the Common Hereditary Cancers panel.   The STAT Breast cancer panel offered by [facility] includes sequencing and rearrangement analysis for the following 9 genes:  ATM, BRCA1, BRCA2, CDH1, CHEK2, PALB2, PTEN, STK11 and TP53.  The Common Hereditary Cancers Panel offered by [facility] includes sequencing and/or  deletion duplication testing of the following 48 genes: APC, ATM, AXIN2, BARD1, BMPR1A, BRCA1, BRCA2, BRIP1, CDH1, CDK4, CDKN2A (p14ARF), CDKN2A (p16INK4a), CHEK2, CTNNA1, DICER1, EPCAM (Deletion/duplication testing only), GREM1 (promoter region deletion/duplication testing only), KIT, MEN1, MLH1, MSH2, MSH3, MSH6, MUTYH, NBN, NF1, NHTL1, PALB2, PDGFRA, PMS2, POLD1, POLE, PTEN, RAD50, RAD51C, RAD51D, RNF43, SDHB, SDHC, SDHD, SMAD4, SMARCA4. STK11, TP53, TSC1, TSC2, and VHL.  The following genes were evaluated for sequence changes only: SDHA and HOXB13 c.251G>A variant only.   Based on Ms. Mastandrea's personal and family history of cancer, she meets medical criteria for genetic testing. Despite that she meets criteria, she may still have an out of pocket cost.   PLAN: After considering the risks, benefits, and limitations, Ms. Curlin provided informed consent to pursue genetic testing and the blood sample was sent to [facility] for analysis of the Breast Cancer STAT panel + Common Hereditary Cancers panel. Results should be available within approximately one weeks' time, at which point they will be disclosed by telephone to Ms. Vanderford, as will any additional recommendations warranted by these results. Ms. Milich will receive a summary of her genetic counseling visit and a copy of her results once available. This information will also be available in Epic.   Ms. Amedee questions were answered to her satisfaction today. Our contact information was provided should additional questions or concerns arise. Thank you for the referral and allowing [place] to share in the care of your patient.   Clint Guy, MS, [facility] Certified Genetic Counselor [email or web address] Phone: [phone]  The patient was seen for a total of 25 minutes in face-to-face genetic counseling.  This patient was discussed with Drs. Magrinat, Lindi Adie and/or Burr Medico who agrees with the above.     _______________________________________________________________________ For Office Staff:  Number of people involved in session: 1 Was an Intern/ student involved with case: yes

## 2019-01-30 NOTE — H&P (View-Only) (Signed)
Patricia Nunez Documented: 01/30/2019 7:28 AM Location: [facility] Patient #: [id number] DOB: January 05, 1956 Undefined / Language: [name] / Race: Black or African American Female  History of Present Illness Marcello Moores A. Fama Muenchow MD; 01/30/2019 12:32 PM) Patient words: Pt sent at the request of [facility] for mammographic abnormality left breast 9 mm upper outer quadrant core bx IDC grade 2 ER POS PR POS her 2 neu negative ki 15 % No complaints Axilla negative strong family history of breast cancer.  Sore at bx site. No hx of mass or discharge     Patient complains of a palpable abnormality in the left breast. Patient's last screening mammogram was October 24, 2018.  EXAM: DIGITAL DIAGNOSTIC LEFT MAMMOGRAM WITH CAD AND TOMO  ULTRASOUND LEFT BREAST  COMPARISON: Previous exam(s).  ACR Breast Density Category c: The breast tissue is heterogeneously dense, which may obscure small masses.  FINDINGS: There is a 1 cm mass in the middle third of the upper-outer quadrant of the left breast. No additional mass is seen in the left breast. There are no malignant type microcalcifications.  Mammographic images were processed with CAD.  On physical exam, I palpate a discrete mass in the left breast at 3 o'clock 4 cm from the nipple.  Targeted ultrasound is performed, showing an irregular hypoechoic mass in the left breast at 3 o'clock 4 cm from the nipple measuring 8 x 9 x 6 mm. Sonographic evaluation of the left axilla does not show any enlarged adenopathy.  IMPRESSION: Suspicious mass in the 3 o'clock region of the left breast.  RECOMMENDATION: Ultrasound-guided core biopsy of the mass in the 3 o'clock region of the left breast is recommended. The biopsy will be scheduled at the patient's convenience.  I have discussed the findings and recommendations with the patient. If applicable, a reminder letter will be sent to the patient regarding the next appointment.  BI-RADS CATEGORY  5: Highly suggestive of malignancy.   Electronically Signed By: Lillia Mountain M.D. On: 01/16/2019 12:15          ADDITIONAL INFORMATION: FLUORESCENCE IN-SITU HYBRIDIZATION Results: GROUP 5: HER2 **NEGATIVE** Equivocal form of amplification of the HER2 gene was detected in the IHC 2+ tissue sample received from this individual. HER2 FISH was performed by a technologist and cell imaging and analysis on the [employer]. RATIO OF HER2/CEN17 SIGNALS 1.15 AVERAGE HER2 COPY NUMBER PER CELL 2.65 The ratio of HER2/CEN 17 is within the range < 2.0 of HER2/CEN 17 and a copy number of HER2 signals per cell is <4.0. Arch Pathol Lab Med 1:1,2018 Vicente Males MD Pathologist, Electronic Signature ( Signed 01/23/2019) PROGNOSTIC INDICATORS Results: IMMUNOHISTOCHEMICAL AND MORPHOMETRIC ANALYSIS PERFORMED MANUALLY The tumor cells are EQUIVOCAL for Her2 (2+). Her2 by FISH will be performed and the results reported separately. Estrogen Receptor: 95%, POSITIVE, STRONG STAINING INTENSITY Progesterone Receptor: 95%, POSITIVE, STRONG STAINING INTENSITY Proliferation Marker Ki67: 15% REFERENCE RANGE ESTROGEN RECEPTOR NEGATIVE 0% POSITIVE =>1% REFERENCE RANGE PROGESTERONE RECEPTOR 1 of 3 FINAL for Tonner, Kyarra P ([id number]) ADDITIONAL INFORMATION:(continued) NEGATIVE 0% POSITIVE =>1% All controls stained appropriately Vicente Males MD Pathologist, Electronic Signature ( Signed 01/22/2019) FINAL DIAGNOSIS Diagnosis Breast, left, needle core biopsy, 3 o'clock location - INVASIVE DUCTAL CARCINOMA, GRADE 2. SEE NOTE Diagnosis Note Invasive carcinoma measures 0.6 cm in greatest linear dimension. A breast prognostic profile is pending and will be reported in an addendum. Dr. Melina Copa has reviewed the case and concurs with the above diagnosis. The [facility] was notified on 01/21/2019. Jaquita Folds  MD Pathologist, Electronic Signature (Case signed 01/21/2019) Specimen  Gross and Clinical Information Specimen Comment Formalin time: 8:00 AM; extracted < 1 minute; palpable suspicious 21m mass outer left breast Specimen(s) Obtained: Breast, left, needle core biopsy, 3 o'clock location.  The patient is a 63year old female.   Past Surgical History (Tawni Pummel RN; 01/30/2019 7:28 AM) Breast Biopsy Left. Hysterectomy (not due to cancer) - Complete  Diagnostic Studies History (Tawni Pummel RN; 01/30/2019 7:28 AM) Colonoscopy 1-5 years ago Mammogram within last year Pap Smear 1-5 years ago  Medication History (Tawni Pummel RN; 01/30/2019 7:28 AM) Medications Reconciled  Social History (Tawni Pummel RN; 01/30/2019 7:28 AM) Caffeine use Coffee, Tea. No alcohol use No drug use Tobacco use Never smoker.  Family History (Tawni Pummel RN; 01/30/2019 7:28 AM) Breast Cancer Sister. Cerebrovascular Accident Mother. Diabetes Mellitus Sister. Heart Disease Father. Hypertension Brother. Thyroid problems Brother.  Pregnancy / Birth History (Tawni Pummel RN; 01/30/2019 7:28 AM) Age at menarche 89years. Contraceptive History Oral contraceptives. Gravida 2 Maternal age 65-25Para 1 Regular periods  Other Problems (Tawni Pummel RN; 01/30/2019 7:28 AM) Breast Cancer High blood pressure     Review of Systems (Caisley Baxendale A. Mailen Newborn MD; 01/30/2019 12:32 PM) General Present- Weight Loss. Not Present- Appetite Loss, Chills, Fatigue, Fever, Night Sweats and Weight Gain. Skin Not Present- Change in Wart/Mole, Dryness, Hives, Jaundice, New Lesions, Non-Healing Wounds, Rash and Ulcer. HEENT Present- Seasonal Allergies and Wears glasses/contact lenses. Not Present- Earache, Hearing Loss, Hoarseness, Nose Bleed, Oral Ulcers, Ringing in the Ears, Sinus Pain, Sore Throat, Visual Disturbances and Yellow Eyes. Respiratory Not Present- Bloody sputum, Chronic Cough, Difficulty Breathing, Snoring and Wheezing. Breast Present-  Breast Mass. Not Present- Breast Pain, Nipple Discharge and Skin Changes. Cardiovascular Not Present- Chest Pain, Difficulty Breathing Lying Down, Leg Cramps, Palpitations, Rapid Heart Rate, Shortness of Breath and Swelling of Extremities. Gastrointestinal Not Present- Abdominal Pain, Bloating, Bloody Stool, Change in Bowel Habits, Chronic diarrhea, Constipation, Difficulty Swallowing, Excessive gas, Gets full quickly at meals, Hemorrhoids, Indigestion, Nausea, Rectal Pain and Vomiting. Female Genitourinary Not Present- Frequency, Nocturia, Painful Urination, Pelvic Pain and Urgency. Musculoskeletal Not Present- Back Pain, Joint Pain, Joint Stiffness, Muscle Pain, Muscle Weakness and Swelling of Extremities. Neurological Not Present- Decreased Memory, Fainting, Headaches, Numbness, Seizures, Tingling, Tremor, Trouble walking and Weakness. Psychiatric Present- Change in Sleep Pattern. Not Present- Anxiety, Bipolar, Depression, Fearful and Frequent crying. Endocrine Present- Hot flashes. Not Present- Cold Intolerance, Excessive Hunger, Hair Changes, Heat Intolerance and New Diabetes. Hematology Present- Easy Bruising. Not Present- Blood Thinners, Excessive bleeding, Gland problems, HIV and Persistent Infections. All other systems negative   Physical Exam (Makara Lanzo A. Matai Carpenito MD; 01/30/2019 12:35 PM)  General Mental Status-Alert. General Appearance-Consistent with stated age. Hydration-Well hydrated. Voice-Normal.  Chest and Lung Exam Chest and lung exam reveals -quiet, even and easy respiratory effort with no use of accessory muscles and on auscultation, normal breath sounds, no adventitious sounds and normal vocal resonance. Inspection Chest Wall - Normal. Back - normal.  Breast Note: hematoma left breast right breast normal  Cardiovascular Cardiovascular examination reveals -normal heart sounds, regular rate and rhythm with no murmurs and normal pedal pulses  bilaterally.  Neurologic Neurologic evaluation reveals -alert and oriented x 3 with no impairment of recent or remote memory. Mental Status-Normal.  Musculoskeletal Normal Exam - Left-Upper Extremity Strength Normal and Lower Extremity Strength Normal. Normal Exam - Right-Upper Extremity Strength Normal and Lower Extremity Strength Normal.  Lymphatic Head & Neck  General Head & Neck  Lymphatics: Bilateral - Description - Normal. Axillary  General Axillary Region: Bilateral - Description - Normal. Tenderness - Non Tender.    Assessment & Plan (Geoffry Bannister A. Asani Deniston MD; 01/30/2019 12:36 PM)  BREAST CANCER, LEFT (C50.912) Impression: left breast lumpectomy and SLN mapping  genetics   Risk of lumpectomy include bleeding, infection, seroma, more surgery, use of seed/wire, wound care, cosmetic deformity and the need for other treatments, death , blood clots, death. Pt agrees to proceed. Risk of sentinel lymph node mapping include bleeding, infection, lymphedema, shoulder pain. stiffness, dye allergy. cosmetic deformity , blood clots, death, need for more surgery. Pt agrees to proceed.  Current Plans You are being scheduled for surgery- Our schedulers will call you.  You should hear from our office's scheduling department within 5 working days about the location, date, and time of surgery. We try to make accommodations for patient's preferences in scheduling surgery, but sometimes the OR schedule or the surgeon's schedule prevents [place] from making those accommodations.  If you have not heard from our office [phone]) in 5 working days, call the office and ask for your surgeon's nurse.  If you have other questions about your diagnosis, plan, or surgery, call the office and ask for your surgeon's nurse.  We discussed the staging and pathophysiology of breast cancer. We discussed all of the different options for treatment for breast cancer including surgery, chemotherapy,  radiation therapy, Herceptin, and antiestrogen therapy. We discussed a sentinel lymph node biopsy as she does not appear to having lymph node involvement right now. We discussed the performance of that with injection of radioactive tracer and blue dye. We discussed that she would have an incision underneath her axillary hairline. We discussed that there is a bout a 10-20% chance of having a positive node with a sentinel lymph node biopsy and we will await the permanent pathology to make any other first further decisions in terms of her treatment. One of these options might be to return to the operating room to perform an axillary lymph node dissection. We discussed about a 1-2% risk lifetime of chronic shoulder pain as well as lymphedema associated with a sentinel lymph node biopsy. We discussed the options for treatment of the breast cancer which included lumpectomy versus a mastectomy. We discussed the performance of the lumpectomy with a wire placement. We discussed a 10-20% chance of a positive margin requiring reexcision in the operating room. We also discussed that she may need radiation therapy or antiestrogen therapy or both if she undergoes lumpectomy. We discussed the mastectomy and the postoperative care for that as well. We discussed that there is no difference in her survival whether she undergoes lumpectomy with radiation therapy or antiestrogen therapy versus a mastectomy. There is a slight difference in the local recurrence rate being 3-5% with lumpectomy and about 1% with a mastectomy. We discussed the risks of operation including bleeding, infection, possible reoperation. She understands her further therapy will be based on what her stages at the time of her operation.  Pt Education - [facility] breast cancer surgery: discussed with patient and provided information.

## 2019-01-30 NOTE — H&P (Signed)
Patricia Nunez Documented: 01/30/2019 7:28 AM Location: [facility] Patient #: [id number] DOB: [birth year] Undefined / Language: Patricia Nunez / Race: Black or African American Female  History of Present Illness Patricia Moores A. Toya Palacios MD; 01/30/2019 12:32 PM) Patient words: Pt sent at the request of [facility] for mammographic abnormality left breast 9 mm upper outer quadrant core bx IDC grade 2 ER POS PR POS her 2 neu negative ki 15 % No complaints Axilla negative strong family history of breast cancer.  Sore at bx site. No hx of mass or discharge     Patient complains of a palpable abnormality in the left breast. Patient's last screening mammogram was October 24, 2018.  EXAM: DIGITAL DIAGNOSTIC LEFT MAMMOGRAM WITH CAD AND TOMO  ULTRASOUND LEFT BREAST  COMPARISON: Previous exam(s).  ACR Breast Density Category c: The breast tissue is heterogeneously dense, which may obscure small masses.  FINDINGS: There is a 1 cm mass in the middle third of the upper-outer quadrant of the left breast. No additional mass is seen in the left breast. There are no malignant type microcalcifications.  Mammographic images were processed with CAD.  On physical exam, I palpate a discrete mass in the left breast at 3 o'clock 4 cm from the nipple.  Targeted ultrasound is performed, showing an irregular hypoechoic mass in the left breast at 3 o'clock 4 cm from the nipple measuring 8 x 9 x 6 mm. Sonographic evaluation of the left axilla does not show any enlarged adenopathy.  IMPRESSION: Suspicious mass in the 3 o'clock region of the left breast.  RECOMMENDATION: Ultrasound-guided core biopsy of the mass in the 3 o'clock region of the left breast is recommended. The biopsy will be scheduled at the patient's convenience.  I have discussed the findings and recommendations with the patient. If applicable, a reminder letter will be sent to the patient regarding the next appointment.  BI-RADS CATEGORY  5: Highly suggestive of malignancy.   Electronically Signed By: Lillia Mountain M.D. On: 01/16/2019 12:15          ADDITIONAL INFORMATION: FLUORESCENCE IN-SITU HYBRIDIZATION Results: GROUP 5: HER2 **NEGATIVE** Equivocal form of amplification of the HER2 gene was detected in the IHC 2+ tissue sample received from this individual. HER2 FISH was performed by a technologist and cell imaging and analysis on the [employer]. RATIO OF HER2/CEN17 SIGNALS 1.15 AVERAGE HER2 COPY NUMBER PER CELL 2.65 The ratio of HER2/CEN 17 is within the range < 2.0 of HER2/CEN 17 and a copy number of HER2 signals per cell is <4.0. Arch Pathol Lab Med 1:1,2018 Vicente Males MD Pathologist, Electronic Signature ( Signed 01/23/2019) PROGNOSTIC INDICATORS Results: IMMUNOHISTOCHEMICAL AND MORPHOMETRIC ANALYSIS PERFORMED MANUALLY The tumor cells are EQUIVOCAL for Her2 (2+). Her2 by FISH will be performed and the results reported separately. Estrogen Receptor: 95%, POSITIVE, STRONG STAINING INTENSITY Progesterone Receptor: 95%, POSITIVE, STRONG STAINING INTENSITY Proliferation Marker Ki67: 15% REFERENCE RANGE ESTROGEN RECEPTOR NEGATIVE 0% POSITIVE =>1% REFERENCE RANGE PROGESTERONE RECEPTOR 1 of 3 FINAL for Patricia Nunez, Patricia Nunez ([id number]) ADDITIONAL INFORMATION:(continued) NEGATIVE 0% POSITIVE =>1% All controls stained appropriately Vicente Males MD Pathologist, Electronic Signature ( Signed 01/22/2019) FINAL DIAGNOSIS Diagnosis Breast, left, needle core biopsy, 3 o'clock location - INVASIVE DUCTAL CARCINOMA, GRADE 2. SEE NOTE Diagnosis Note Invasive carcinoma measures 0.6 cm in greatest linear dimension. A breast prognostic profile is pending and will be reported in an addendum. Dr. Melina Copa has reviewed the case and concurs with the above diagnosis. The [facility] was notified on 01/21/2019. Jaquita Folds  MD Pathologist, Electronic Signature (Case signed 01/21/2019) Specimen  Gross and Clinical Information Specimen Comment Formalin time: 8:00 AM; extracted < 1 minute; palpable suspicious 9mm mass outer left breast Specimen(s) Obtained: Breast, left, needle core biopsy, 3 o'clock location.  The patient is a 63 year old female.   Past Surgical History (Sylvia Ledford, RN; 01/30/2019 7:28 AM) Breast Biopsy Left. Hysterectomy (not due to cancer) - Complete  Diagnostic Studies History (Sylvia Ledford, RN; 01/30/2019 7:28 AM) Colonoscopy 1-5 years ago Mammogram within last year Pap Smear 1-5 years ago  Medication History (Sylvia Ledford, RN; 01/30/2019 7:28 AM) Medications Reconciled  Social History (Sylvia Ledford, RN; 01/30/2019 7:28 AM) Caffeine use Coffee, Tea. No alcohol use No drug use Tobacco use Never smoker.  Family History (Sylvia Ledford, RN; 01/30/2019 7:28 AM) Breast Cancer Sister. Cerebrovascular Accident Mother. Diabetes Mellitus Sister. Heart Disease Father. Hypertension Brother. Thyroid problems Brother.  Pregnancy / Birth History (Sylvia Ledford, RN; 01/30/2019 7:28 AM) Age at menarche 10 years. Contraceptive History Oral contraceptives. Gravida 2 Maternal age 21-25 Para 1 Regular periods  Other Problems (Sylvia Ledford, RN; 01/30/2019 7:28 AM) Breast Cancer High blood pressure     Review of Systems (Haroun Cotham A. Courtnay Petrilla MD; 01/30/2019 12:32 PM) General Present- Weight Loss. Not Present- Appetite Loss, Chills, Fatigue, Fever, Night Sweats and Weight Gain. Skin Not Present- Change in Wart/Mole, Dryness, Hives, Jaundice, New Lesions, Non-Healing Wounds, Rash and Ulcer. HEENT Present- Seasonal Allergies and Wears glasses/contact lenses. Not Present- Earache, Hearing Loss, Hoarseness, Nose Bleed, Oral Ulcers, Ringing in the Ears, Sinus Pain, Sore Throat, Visual Disturbances and Yellow Eyes. Respiratory Not Present- Bloody sputum, Chronic Cough, Difficulty Breathing, Snoring and Wheezing. Breast Present-  Breast Mass. Not Present- Breast Pain, Nipple Discharge and Skin Changes. Cardiovascular Not Present- Chest Pain, Difficulty Breathing Lying Down, Leg Cramps, Palpitations, Rapid Heart Rate, Shortness of Breath and Swelling of Extremities. Gastrointestinal Not Present- Abdominal Pain, Bloating, Bloody Stool, Change in Bowel Habits, Chronic diarrhea, Constipation, Difficulty Swallowing, Excessive gas, Gets full quickly at meals, Hemorrhoids, Indigestion, Nausea, Rectal Pain and Vomiting. Female Genitourinary Not Present- Frequency, Nocturia, Painful Urination, Pelvic Pain and Urgency. Musculoskeletal Not Present- Back Pain, Joint Pain, Joint Stiffness, Muscle Pain, Muscle Weakness and Swelling of Extremities. Neurological Not Present- Decreased Memory, Fainting, Headaches, Numbness, Seizures, Tingling, Tremor, Trouble walking and Weakness. Psychiatric Present- Change in Sleep Pattern. Not Present- Anxiety, Bipolar, Depression, Fearful and Frequent crying. Endocrine Present- Hot flashes. Not Present- Cold Intolerance, Excessive Hunger, Hair Changes, Heat Intolerance and New Diabetes. Hematology Present- Easy Bruising. Not Present- Blood Thinners, Excessive bleeding, Gland problems, HIV and Persistent Infections. All other systems negative   Physical Exam (Madaleine Simmon A. Roizy Harold MD; 01/30/2019 12:35 PM)  General Mental Status-Alert. General Appearance-Consistent with stated age. Hydration-Well hydrated. Voice-Normal.  Chest and Lung Exam Chest and lung exam reveals -quiet, even and easy respiratory effort with no use of accessory muscles and on auscultation, normal breath sounds, no adventitious sounds and normal vocal resonance. Inspection Chest Wall - Normal. Back - normal.  Breast Note: hematoma left breast right breast normal  Cardiovascular Cardiovascular examination reveals -normal heart sounds, regular rate and rhythm with no murmurs and normal pedal pulses  bilaterally.  Neurologic Neurologic evaluation reveals -alert and oriented x 3 with no impairment of recent or remote memory. Mental Status-Normal.  Musculoskeletal Normal Exam - Left-Upper Extremity Strength Normal and Lower Extremity Strength Normal. Normal Exam - Right-Upper Extremity Strength Normal and Lower Extremity Strength Normal.  Lymphatic Head & Neck  General Head & Neck   Lymphatics: Bilateral - Description - Normal. Axillary  General Axillary Region: Bilateral - Description - Normal. Tenderness - Non Tender.    Assessment & Plan (Mariacristina Aday A. Felipe Cabell MD; 01/30/2019 12:36 PM)  BREAST CANCER, LEFT (C50.912) Impression: left breast lumpectomy and SLN mapping  genetics   Risk of lumpectomy include bleeding, infection, seroma, more surgery, use of seed/wire, wound care, cosmetic deformity and the need for other treatments, death , blood clots, death. Pt agrees to proceed. Risk of sentinel lymph node mapping include bleeding, infection, lymphedema, shoulder pain. stiffness, dye allergy. cosmetic deformity , blood clots, death, need for more surgery. Pt agrees to proceed.  Current Plans You are being scheduled for surgery- Our schedulers will call you.  You should hear from our office's scheduling department within 5 working days about the location, date, and time of surgery. We try to make accommodations for patient's preferences in scheduling surgery, but sometimes the OR schedule or the surgeon's schedule prevents [place] from making those accommodations.  If you have not heard from our office [phone]) in 5 working days, call the office and ask for your surgeon's nurse.  If you have other questions about your diagnosis, plan, or surgery, call the office and ask for your surgeon's nurse.  We discussed the staging and pathophysiology of breast cancer. We discussed all of the different options for treatment for breast cancer including surgery, chemotherapy,  radiation therapy, Herceptin, and antiestrogen therapy. We discussed a sentinel lymph node biopsy as she does not appear to having lymph node involvement right now. We discussed the performance of that with injection of radioactive tracer and blue dye. We discussed that she would have an incision underneath her axillary hairline. We discussed that there is a bout a 10-20% chance of having a positive node with a sentinel lymph node biopsy and we will await the permanent pathology to make any other first further decisions in terms of her treatment. One of these options might be to return to the operating room to perform an axillary lymph node dissection. We discussed about a 1-2% risk lifetime of chronic shoulder pain as well as lymphedema associated with a sentinel lymph node biopsy. We discussed the options for treatment of the breast cancer which included lumpectomy versus a mastectomy. We discussed the performance of the lumpectomy with a wire placement. We discussed a 10-20% chance of a positive margin requiring reexcision in the operating room. We also discussed that she may need radiation therapy or antiestrogen therapy or both if she undergoes lumpectomy. We discussed the mastectomy and the postoperative care for that as well. We discussed that there is no difference in her survival whether she undergoes lumpectomy with radiation therapy or antiestrogen therapy versus a mastectomy. There is a slight difference in the local recurrence rate being 3-5% with lumpectomy and about 1% with a mastectomy. We discussed the risks of operation including bleeding, infection, possible reoperation. She understands her further therapy will be based on what her stages at the time of her operation.  Pt Education - [facility] breast cancer surgery: discussed with patient and provided information.

## 2019-01-30 NOTE — Progress Notes (Signed)
Radiation Oncology         [phone] ________________________________  Multidisciplinary Breast Oncology Clinic [facility]) Initial Outpatient Consultation  Name: Patricia Nunez MRN: [id number]  Date: 01/30/2019  DOB: 1955-12-23  WL:NLGX, Berneta Sages, MD  Erroll Luna, MD   REFERRING PHYSICIAN: Erroll Luna, MD  DIAGNOSIS: The encounter diagnosis was Malignant neoplasm of upper-outer quadrant of left breast in female, estrogen receptor positive ([place]).  Clinical Stage T1b Left Breast UOQ, Invasive Ductal Carcinoma, ER+ / PR+ / Her2-, Grade 2    ICD-10-CM   1. Malignant neoplasm of upper-outer quadrant of left breast in female, estrogen receptor positive ([place])  C50.412    Z17.0     HISTORY OF PRESENT ILLNESS::Patricia Nunez is a 63 y.o. female who is presenting to the office today for evaluation of her newly diagnosed breast cancer. She is accompanied by her son via telephone. She is doing well overall.   She presented with a palpable abnormality in the left breast. Of note, her most screening mammogram was on 10/24/2018. She underwent left diagnostic mammography with tomography and left breast ultrasonography at [facility] on 01/16/2019 showing: 9 mm suspicious mass in the left breast at 3 o'clock.  Biopsy on 01/18/2019 showed: invasive ductal carcinoma, grade 2. Prognostic indicators significant for: estrogen receptor, 95% positive and progesterone receptor, 95% positive, both with strong staining intensity. Proliferation marker Ki67 at 15%. HER2 negative by FISH.  Menarche: 63 years old Age at first live birth: 63 years old GP: 1 LMP: in 2000 Contraceptive: yes, used for 10 years without complications HRT: no   The patient was referred today for presentation in the multidisciplinary conference.  Radiology studies and pathology slides were presented there for review and discussion of treatment options.  A consensus was discussed regarding potential next steps.  PREVIOUS  RADIATION THERAPY: No  PAST MEDICAL HISTORY:  Past Medical History:  Diagnosis Date   Colon polyps    Family history of breast cancer    Family history of leukemia    Hypertension    PONV (postoperative nausea and vomiting)    Thyroid disease    h/o hyperactive, just being followed by endocrinologist    PAST SURGICAL HISTORY: Past Surgical History:  Procedure Laterality Date   ABDOMINAL HYSTERECTOMY  2000   CATARACT EXTRACTION W/PHACO Left 03/10/2014   Procedure: CATARACT EXTRACTION PHACO AND INTRAOCULAR LENS PLACEMENT LEFT EYE;  Surgeon: Tonny Branch, MD;  Location: [facility];  Service: Ophthalmology;  Laterality: Left;  CDE 3.50   CATARACT EXTRACTION W/PHACO Right 04/14/2014   Procedure: CATARACT EXTRACTION PHACO AND INTRAOCULAR LENS PLACEMENT (IOC);  Surgeon: Tonny Branch, MD;  Location: [facility];  Service: Ophthalmology;  Laterality: Right;  CDE 3.35   COLONOSCOPY  11/2008   Dr. Gala Romney: normal. Next colonoscopy in 2015 due to h/o polyps by Dr. Tamala Julian and path unknown   COLONOSCOPY N/A 12/12/2014   Procedure: COLONOSCOPY;  Surgeon: Daneil Dolin, MD;  Location: [facility] SU[facility];  Service: Endoscopy;  Laterality: N/A;  1230   GANGLION CYST EXCISION Left    x2-wrist- [place]    FAMILY HISTORY:  Family History  Problem Relation Age of Onset   Breast cancer Sister        diagnosed in her 62s   Stroke Sister    Breast cancer Maternal Grandmother        diagnosed in her 92s or 64s   Breast cancer Sister        diagnosed in her 5s  Stroke Mother    Colon cancer Neg Hx     SOCIAL HISTORY:  Social History   Socioeconomic History   Marital status: Divorced    Spouse name: Not on file   Number of children: 1   Years of education: Not on file   Highest education level: Not on file  Occupational History   Occupation: school system  Social Needs   Financial resource strain: Not on file   Food insecurity    Worry: Not on file    Inability: Not on file    Transportation needs    Medical: Not on file    Non-medical: Not on file  Tobacco Use   Smoking status: Never Smoker   Tobacco comment: Never smoked  Substance and Sexual Activity   Alcohol use: No    Alcohol/week: 0.0 standard drinks   Drug use: No   Sexual activity: Yes    Birth control/protection: Surgical  Lifestyle   Physical activity    Days per week: Not on file    Minutes per session: Not on file   Stress: Not on file  Relationships   Social connections    Talks on phone: Not on file    Gets together: Not on file    Attends religious service: Not on file    Active member of club or organization: Not on file    Attends meetings of clubs or organizations: Not on file    Relationship status: Not on file  Other Topics Concern   Not on file  Social History Narrative   Not on file    ALLERGIES:  Allergies  Allergen Reactions   Codeine     MEDICATIONS:  Current Outpatient Medications  Medication Sig Dispense Refill   aspirin 81 MG tablet Take 81 mg by mouth daily.       calcium carbonate (OS-CAL) 600 MG TABS Take 600 mg by mouth daily with breakfast.      Cholecalciferol (VITAMIN D) 400 UNITS capsule Take 400 Units by mouth daily.       lisinopril (PRINIVIL,ZESTRIL) 20 MG tablet Take 20 mg by mouth daily.       Multiple Vitamin (MULTIVITAMIN) capsule Take 1 capsule by mouth daily.       polyethylene glycol-electrolytes (NULYTELY/GOLYTELY) 420 G solution Take 4,000 mLs by mouth once. 4000 mL 0   vitamin E 100 UNIT capsule Take 100 Units by mouth daily.       No current facility-administered medications for this encounter.     REVIEW OF SYSTEMS: A 10+ POINT REVIEW OF SYSTEMS WAS OBTAINED including neurology, dermatology, psychiatry, cardiac, respiratory, lymph, extremities, GI, GU, musculoskeletal, constitutional, reproductive, HEENT. On the provided form, she reports weight change, loss of sleep, sinus problems, palpable breast lump, and easy  bruising. She denies cough, shortness of breath, pain, and any other symptoms.    PHYSICAL EXAM:  Vitals with BMI 01/30/2019  Height '5\' 5"'   Weight 115 lbs 13 oz  BMI 21.62  Systolic 446  Diastolic 77  Pulse 950  Lungs are clear to auscultation bilaterally. Heart has regular rate and rhythm. No palpable cervical, supraclavicular, or axillary adenopathy. Abdomen soft, non-tender, normal bowel sounds. Breast: right breast with no palpable mass, nipple discharge, or bleeding. left breast with some bruising in the lateral aspect of the breast from recent biopsy with induration measuring approximately 2 x 3 cm, no nipple discharge or bleeding.   ECOG = 1  0 - Asymptomatic (Fully active, able to carry on  all predisease activities without restriction)  1 - Symptomatic but completely ambulatory (Restricted in physically strenuous activity but ambulatory and able to carry out work of a light or sedentary nature. For example, light housework, office work)  2 - Symptomatic, <50% in bed during the day (Ambulatory and capable of all self care but unable to carry out any work activities. Up and about more than 50% of waking hours)  3 - Symptomatic, >50% in bed, but not bedbound (Capable of only limited self-care, confined to bed or chair 50% or more of waking hours)  4 - Bedbound (Completely disabled. Cannot carry on any self-care. Totally confined to bed or chair)  5 - Death   Eustace Pen MM, Creech RH, Tormey DC, et al. [phone]). "Toxicity and response criteria of the [facility]". [place] Oncol. 5 (6): 649-55  LABORATORY DATA:  Lab Results  Component Value Date   WBC 4.8 01/30/2019   HGB 13.0 01/30/2019   HCT 40.2 01/30/2019   MCV 90.3 01/30/2019   PLT 255 01/30/2019   Lab Results  Component Value Date   NA 140 01/30/2019   K 4.3 01/30/2019   CL 103 01/30/2019   CO2 28 01/30/2019   Lab Results  Component Value Date   ALT 17 01/30/2019   AST 19 01/30/2019    ALKPHOS 55 01/30/2019   BILITOT 0.3 01/30/2019    PULMONARY FUNCTION TEST:   Recent Review Flowsheet Data    There is no flowsheet data to display.      RADIOGRAPHY: US Breast Ltd Uni Left Inc Axilla  Result Date: 01/16/2019 CLINICAL DATA:  Patient complains of a palpable abnormality in the left breast. Patient's last screening mammogram was October 24, 2018. EXAM: DIGITAL DIAGNOSTIC LEFT MAMMOGRAM WITH CAD AND TOMO ULTRASOUND LEFT BREAST COMPARISON:  Previous exam(s). ACR Breast Density Category c: The breast tissue is heterogeneously dense, which may obscure small masses. FINDINGS: There is a 1 cm mass in the middle third of the upper-outer quadrant of the left breast. No additional mass is seen in the left breast. There are no malignant type microcalcifications. Mammographic images were processed with CAD. On physical exam, I palpate a discrete mass in the left breast at 3 o'clock 4 cm from the nipple. Targeted ultrasound is performed, showing an irregular hypoechoic mass in the left breast at 3 o'clock 4 cm from the nipple measuring 8 x 9 x 6 mm. Sonographic evaluation of the left axilla does not show any enlarged adenopathy. IMPRESSION: Suspicious mass in the 3 o'clock region of the left breast. RECOMMENDATION: Ultrasound-guided core biopsy of the mass in the 3 o'clock region of the left breast is recommended. The biopsy will be scheduled at the patient's convenience. I have discussed the findings and recommendations with the patient. If applicable, a reminder letter will be sent to the patient regarding the next appointment. BI-RADS CATEGORY  5: Highly suggestive of malignancy. Electronically Signed   By: Lillia Mountain M.D.   On: 01/16/2019 12:15   Mm Diag Breast Tomo Uni Left  Result Date: 01/16/2019 CLINICAL DATA:  Patient complains of a palpable abnormality in the left breast. Patient's last screening mammogram was October 24, 2018. EXAM: DIGITAL DIAGNOSTIC LEFT MAMMOGRAM WITH CAD AND TOMO ULTRASOUND  LEFT BREAST COMPARISON:  Previous exam(s). ACR Breast Density Category c: The breast tissue is heterogeneously dense, which may obscure small masses. FINDINGS: There is a 1 cm mass in the middle third of the upper-outer quadrant of the left  breast. No additional mass is seen in the left breast. There are no malignant type microcalcifications. Mammographic images were processed with CAD. On physical exam, I palpate a discrete mass in the left breast at 3 o'clock 4 cm from the nipple. Targeted ultrasound is performed, showing an irregular hypoechoic mass in the left breast at 3 o'clock 4 cm from the nipple measuring 8 x 9 x 6 mm. Sonographic evaluation of the left axilla does not show any enlarged adenopathy. IMPRESSION: Suspicious mass in the 3 o'clock region of the left breast. RECOMMENDATION: Ultrasound-guided core biopsy of the mass in the 3 o'clock region of the left breast is recommended. The biopsy will be scheduled at the patient's convenience. I have discussed the findings and recommendations with the patient. If applicable, a reminder letter will be sent to the patient regarding the next appointment. BI-RADS CATEGORY  5: Highly suggestive of malignancy. Electronically Signed   By: Lillia Mountain M.D.   On: 01/16/2019 12:15   Mm Clip Placement Left  Result Date: 01/18/2019 CLINICAL DATA:  Confirmation of clip placement after ultrasound-guided core needle biopsy of a suspicious mass involving the OUTER LEFT breast at the near 3 o'clock location. EXAM: 2D AND TOMOSYNTHESIS DIAGNOSTIC LEFT MAMMOGRAM POST ULTRASOUND BIOPSY COMPARISON:  Previous exam(s). FINDINGS: Tomosynthesis and synthesized CC and mediolateral full field images were obtained following ultrasound guided biopsy of a mass in the OUTER LEFT breast at the near 3 o'clock location. The ribbon shaped tissue marker clip is appropriately positioned immediately adjacent to the biopsied mass in the OUTER LEFT breast at MIDDLE depth, slight UPPER OUTER  QUADRANT. IMPRESSION: Appropriate positioning of the ribbon shaped tissue marker clip immediately adjacent to the biopsied mass in the OUTER LEFT breast. Final Assessment: Post Procedure Mammograms for Marker Placement Electronically Signed   By: Evangeline Dakin M.D.   On: 01/18/2019 08:21   [place] Lt Breast Bx W Loc Dev 1st Lesion Img Bx Spec US Guide  Addendum Date: 01/21/2019   ADDENDUM REPORT: 01/21/2019 13:53 ADDENDUM: Pathology revealed GRADE II INVASIVE DUCTAL CARCINOMA of the Left breast, 3 o'clock location. This was found to be concordant by Dr. Peggye Fothergill. Pathology results were discussed with the patient by telephone. The patient reported doing well after the biopsy with tenderness at the site. Post biopsy instructions and care were reviewed and questions were answered. The patient was encouraged to call [facility] for any additional concerns. The patient was referred to [facility] on January 30, 2019. Pathology results reported by Terie Purser, RN on 01/21/2019. Electronically Signed   By: Evangeline Dakin M.D.   On: 01/21/2019 13:53   Result Date: 01/21/2019 CLINICAL DATA:  63 year old with a palpable suspicious 9 mm mass involving the OUTER LEFT breast at the 3 o'clock position approximately 4 cm from the nipple. EXAM: ULTRASOUND GUIDED LEFT BREAST CORE NEEDLE BIOPSY COMPARISON:  Previous exam(s). FINDINGS: I met with the patient and we discussed the procedure of ultrasound-guided biopsy, including benefits and alternatives. We discussed the high likelihood of a successful procedure. We discussed the risks of the procedure, including infection, bleeding, tissue injury, clip migration, and inadequate sampling. Informed written consent was given. The usual time-out protocol was performed immediately prior to the procedure. Lesion quadrant: Near 3 o'clock location, slight UPPER OUTER QUADRANT.  Using sterile technique with chlorhexidine as skin antisepsis, 1% lidocaine and 1% lidocaine with epinephrine as local anesthetic, under direct ultrasound  visualization, a 12 gauge Bard Marquee core needle device placed through an 11 gauge introducer needle was used to perform biopsy of the mass in the OUTER LEFT breast using a lateral approach. At the conclusion of the procedure ribbon shaped tissue marker clip was deployed into the biopsy cavity. Follow up 2 view mammogram was performed and dictated separately. IMPRESSION: Ultrasound guided biopsy of a mass in the OUTER LEFT breast. No apparent complications. Electronically Signed: By: Evangeline Dakin M.D. On: 01/18/2019 08:22      IMPRESSION: Stage T1b Left Breast UOQ, Invasive Ductal Carcinoma, ER+ / PR+ / Her2-, Grade 2  Patient will be a good candidate for breast conservation with radiotherapy to the left breast. We discussed the general course of radiation, potential side effects, and toxicities with radiation and the patient is interested in this approach.    PLAN:  1. Genetic testing 2. Left lumpectomy with sentinel lymph node biopsy 3. Oncotype 4. Adjuvant radiation therapy 5. Aromatase Inhibitor   ------------------------------------------------  Blair Promise, PhD, MD  This document serves as a record of services personally performed by Gery Pray, MD. It was created on his behalf by Wilburn Mylar, a trained medical scribe. The creation of this record is based on the scribe's personal observations and the provider's statements to them. This document has been checked and approved by the attending provider.

## 2019-01-30 NOTE — Telephone Encounter (Signed)
Returned patient's phone call regarding rescheduling an appointment, left a voicemail. 

## 2019-01-30 NOTE — Progress Notes (Signed)
Clinical Social Work [place] Psychosocial Distress Screening [place]  Patient completed distress screening protocol and scored a 7 on the Psychosocial Distress Thermometer which indicates moderate distress. Clinical Social Worker met with patient in [facility] to assess for distress and other psychosocial needs. Patient stated she was feeling overwhelmed but felt "better" after meeting with the treatment team and getting more information on her treatment plan. CSW and patient discussed common feeling and emotions when being diagnosed with cancer, and the importance of support during treatment.  Patient identified her son and family as positive support.  CSW informed patient of the support team and support services at [facility]. CSW provided contact information and encouraged patient to call with any questions or concerns.  ONCBCN DISTRESS SCREENING 01/30/2019  Screening Type Initial Screening  Distress experienced in past week (1-10) 7  Family Problem type Children  Emotional problem type Nervousness/Anxiety;Adjusting to illness  Information Concerns Type Lack of info about diagnosis;Lack of info about treatment     Johnnye Lana, MSW, LCSW, OSW-C Clinical Social Worker [facility] [phone]

## 2019-01-30 NOTE — Therapy (Signed)
[place], [place], [address] Phone: [phone]   Fax:  [phone]  Physical Therapy Evaluation  Patient Details  Name: Patricia Nunez MRN: [id number] Date of Birth: 1955-07-08 Referring Provider (PT): Dr. Stark Klein   Encounter Date: 01/30/2019  PT End of Session - 01/30/19 1314    Visit Number  1    Number of Visits  2    Date for PT Re-Evaluation  03/27/19    PT Start Time  1117    PT Stop Time  1130   Also saw pt from 1227-1233 for a total of 29 minutes   PT Time Calculation (min)  13 min    Activity Tolerance  Patient tolerated treatment well    Behavior During Therapy  [facility] for tasks assessed/performed       Past Medical History:  Diagnosis Date  . Colon polyps   . Hypertension   . PONV (postoperative nausea and vomiting)   . Thyroid disease    h/o hyperactive, just being followed by endocrinologist    Past Surgical History:  Procedure Laterality Date  . ABDOMINAL HYSTERECTOMY  2000  . CATARACT EXTRACTION W/PHACO Left 03/10/2014   Procedure: CATARACT EXTRACTION PHACO AND INTRAOCULAR LENS PLACEMENT LEFT EYE;  Surgeon: Tonny Branch, MD;  Location: [facility] OR[facility];  Service: Ophthalmology;  Laterality: Left;  CDE 3.50  . CATARACT EXTRACTION W/PHACO Right 04/14/2014   Procedure: CATARACT EXTRACTION PHACO AND INTRAOCULAR LENS PLACEMENT (IOC);  Surgeon: Tonny Branch, MD;  Location: [facility] ORS;  Service: Ophthalmology;  Laterality: Right;  CDE 3.35  . COLONOSCOPY  11/2008   Dr. Gala Romney: normal. Next colonoscopy in 2015 due to h/o polyps by Dr. Tamala Julian and path unknown  . COLONOSCOPY N/A 12/12/2014   Procedure: COLONOSCOPY;  Surgeon: Daneil Dolin, MD;  Location: [facility] END[facility] SUITE;  Service: Endoscopy;  Laterality: N/A;  1230  . GANGLION CYST EXCISION Left    x2-wrist- Dianah Pruett    There were no vitals filed for this visit.   Subjective Assessment - 01/30/19 1131    Subjective  Patient reports she is here today  to be seen by her medical team for her newly diagnosed left breast cancer.    Patient is accompained by:  Family member   Son on phone   Pertinent History  Patient was diagnosed on 01/16/2019 with right grade II invasive ductal carcinoma breast cancer. It measures 9 mm and is located in the upper outer quadrant. It is ER/PR positive and HER2 negative with a Ki67 of 15%.    Patient Stated Goals  Reduce lymphedema risk and learn post op shoulder ROM HEP    Currently in Pain?  No/denies         [facility] PT Assessment - 01/30/19 0001      Assessment   Medical Diagnosis  Left breast cancer    Referring Provider (PT)  Dr. Stark Klein    Onset Date/Surgical Date  01/16/19    Hand Dominance  Right    Prior Therapy  none      Precautions   Precautions  Other (comment)    Precaution Comments  active cancer      Restrictions   Weight Bearing Restrictions  No      Balance Screen   Has the patient fallen in the past 6 months  No    Has the patient had a decrease in activity level because of a fear of falling?   No    Is  the patient reluctant to leave their home because of a fear of falling?   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    Leisure  She walks 20 min 3x/week      Cognition   Overall Cognitive Status  Within Functional Limits for tasks assessed      Posture/Postural Control   Posture/Postural Control  Postural limitations    Postural Limitations  Rounded Shoulders;Forward head      ROM / Strength   AROM / PROM / Strength  AROM;Strength      AROM   Overall AROM Comments  Cervical AROM WNL    AROM Assessment Site  Shoulder    Right/Left Shoulder  Right;Left    Right Shoulder Extension  50 Degrees    Right Shoulder Flexion  160  Degrees    Right Shoulder ABduction  165 Degrees    Right Shoulder Internal Rotation  52 Degrees    Right Shoulder External Rotation  83 Degrees    Left Shoulder Extension  57 Degrees    Left Shoulder Flexion  149 Degrees    Left Shoulder ABduction  172 Degrees    Left Shoulder Internal Rotation  73 Degrees    Left Shoulder External Rotation  85 Degrees      Strength   Overall Strength  Within functional limits for tasks performed        LYMPHEDEMA/ONCOLOGY QUESTIONNAIRE - 01/30/19 1312      Type   Cancer Type  Left breast cancer      Lymphedema Assessments   Lymphedema Assessments  Upper extremities      Right Upper Extremity Lymphedema   10 cm Proximal to Olecranon Process  23.2 cm    Olecranon Process  21.7 cm    10 cm Proximal to Ulnar Styloid Process  19.5 cm    Just Proximal to Ulnar Styloid Process  14.2 cm    Across Hand at [employer]  19.1 cm    At [place] of 2nd Digit  6.3 cm      Left Upper Extremity Lymphedema   10 cm Proximal to Olecranon Process  22.7 cm    Olecranon Process  21.3 cm    10 cm Proximal to Ulnar Styloid Process  18.3 cm    Just Proximal to Ulnar Styloid Process  14.4 cm    Across Hand at [employer]  18.5 cm    At [place] of 2nd Digit  5.9 cm          Quick Dash - 01/30/19 0001    Open a tight or new jar  No difficulty    Do heavy household chores (wash walls, wash floors)  No difficulty    Carry a shopping bag or briefcase  No difficulty    Wash your back  No difficulty    Use a knife to cut food  No difficulty    Recreational activities in which you take some force or impact through your arm, shoulder, or hand (golf, hammering, tennis)  No difficulty    During the past week, to what extent has your arm, shoulder or hand problem interfered with your normal social  activities with family, friends, neighbors, or groups?  Slightly    During the past week, to what extent has your arm, shoulder or hand problem limited your work or other  regular daily activities  Slightly    Arm, shoulder, or hand pain.  None    Tingling (pins and needles) in your arm, shoulder, or hand  None    Difficulty Sleeping  No difficulty    DASH Score  4.55 %        Objective measurements completed on examination: See above findings.           Patient was instructed today in a home exercise program today for post op shoulder range of motion. These included active assist shoulder flexion in sitting, scapular retraction, wall walking with shoulder abduction, and hands behind head external rotation.  She was encouraged to do these twice a day, holding 3 seconds and repeating 5 times when permitted by her physician.       PT Education - 01/30/19 1313    Education Details  Lymphedema risk reduction and post op shoulder ROM HEP    Person(s) Educated  Patient    Methods  Explanation;Demonstration;Handout    Comprehension  Returned demonstration;Verbalized understanding          PT Long Term Goals - 01/30/19 1317      PT LONG TERM GOAL #1   Title  Patient will demonstrate she has regained shoulder ROM and function post operatively compared to baselines.    Time  8    Period  Weeks    Status  New    Target Date  03/27/19      Breast Clinic Goals - 01/30/19 1317      Patient will be able to verbalize understanding of pertinent lymphedema risk reduction practices relevant to her diagnosis specifically related to skin care.   Time  1    Status  Achieved      Patient will be able to return demonstrate and/or verbalize understanding of the post-op home exercise program related to regaining shoulder range of motion.   Time  1    Period  Days    Status  Achieved      Patient will be able to verbalize understanding of the importance of attending the postoperative After Breast Cancer Class for further lymphedema risk reduction education and therapeutic exercise.   Time  1    Period  Days    Status  Achieved            Plan  - 01/30/19 1315    Clinical Impression Statement  Patient was diagnosed on 01/16/2019 with right grade II invasive ductal carcinoma breast cancer. It measures 9 mm and is located in the upper outer quadrant. It is ER/PR positive and HER2 negative with a Ki67 of 15%. Her multidisciplinary medical team met prior to her assessments to determine a recommended treatment plan. She is planning to have a left lumpectomy and sentinel node biopsy followed by Oncotype testing, radiation, and anti-estrogen therapy. She will benefit from post op PT to reassess and determine needs.    Stability/Clinical Decision Making  Stable/Uncomplicated    Clinical Decision Making  Low    Rehab Potential  Excellent    PT Frequency  --   Eval and 1 f/u visit   PT Treatment/Interventions  ADLs/Self Care Home Management;Therapeutic exercise;Patient/family education    PT Next Visit Plan  Will reassess 3-4 weeks post op to determine needs    PT  Home Exercise Plan  Post op shoulder ROM HEP    Consulted and Agree with Plan of Care  Patient       Patient will benefit from skilled therapeutic intervention in order to improve the following deficits and impairments:  Postural dysfunction, Decreased range of motion, Pain, Impaired UE functional use, Decreased knowledge of precautions  Visit Diagnosis: Malignant neoplasm of upper-outer quadrant of left breast in female, estrogen receptor positive ([place]) - Plan: PT plan of care cert/re-cert  Abnormal posture - Plan: PT plan of care cert/re-cert   Patient will follow up at outpatient cancer rehab 3-4 weeks following surgery.  If the patient requires physical therapy at that time, a specific plan will be dictated and sent to the referring physician for approval. The patient was educated today on appropriate basic range of motion exercises to begin post operatively and the importance of attending the After Breast Cancer class following surgery.  Patient was educated today on lymphedema  risk reduction practices as it pertains to recommendations that will benefit the patient immediately following surgery.  She verbalized good understanding.      Problem List Patient Active Problem List   Diagnosis Date Noted  . Malignant neoplasm of upper-outer quadrant of left breast in female, estrogen receptor positive ([place]) 01/22/2019  . History of colonic polyps 11/25/2014   Annia Friendly, PT 01/30/19 1:20 PM  [place], [place], [address] Phone: [phone]   Fax:  [phone]  Name: MARQUETTE PIONTEK MRN: [id number] Date of Birth: Mar 19, 1956

## 2019-01-31 ENCOUNTER — Other Ambulatory Visit: Payer: Self-pay | Admitting: Surgery

## 2019-01-31 ENCOUNTER — Encounter: Payer: Self-pay | Admitting: Genetic Counselor

## 2019-01-31 DIAGNOSIS — C50912 Malignant neoplasm of unspecified site of left female breast: Secondary | ICD-10-CM

## 2019-02-07 ENCOUNTER — Telehealth: Payer: Self-pay | Admitting: *Deleted

## 2019-02-07 ENCOUNTER — Telehealth: Payer: Self-pay | Admitting: Genetic Counselor

## 2019-02-07 DIAGNOSIS — Z1379 Encounter for other screening for genetic and chromosomal anomalies: Secondary | ICD-10-CM | POA: Insufficient documentation

## 2019-02-07 NOTE — Telephone Encounter (Signed)
LVM that genetic test results are available. Requested that she call back to review those results.

## 2019-02-07 NOTE — Telephone Encounter (Signed)
Left voicemail for a return phone call to follow up from [facility].

## 2019-02-07 NOTE — Telephone Encounter (Signed)
Revealed negative genetic testing.  Discussed that we do not know why she has breast cancer or why there is cancer in the family.  It could be due to a different gene that we are not testing, or our current technology may not be able detect something.  It will be important for her to keep in contact with genetics to keep up with whether additional testing may be appropriate in the future. Genetic testing did identify a variant of uncertain significance in the BRCA2 gene called c.107C>T. Her results are still considered normal, and this variant should not impact her medical management.

## 2019-02-08 ENCOUNTER — Telehealth: Payer: Self-pay | Admitting: Hematology and Oncology

## 2019-02-08 ENCOUNTER — Encounter: Payer: Self-pay | Admitting: Genetic Counselor

## 2019-02-08 ENCOUNTER — Ambulatory Visit: Payer: Self-pay | Admitting: Genetic Counselor

## 2019-02-08 DIAGNOSIS — Z1379 Encounter for other screening for genetic and chromosomal anomalies: Secondary | ICD-10-CM

## 2019-02-08 NOTE — Progress Notes (Signed)
HPI:  Patricia Nunez was previously seen in the [place] clinic due to a personal and family history of breast cancer and concerns regarding a hereditary predisposition to cancer. Please refer to our prior cancer genetics clinic note for more information regarding our discussion, assessment and recommendations, at the time. Patricia Nunez recent genetic test results were disclosed to her, as were recommendations warranted by these results. These results and recommendations are discussed in more detail below.  CANCER HISTORY:  Oncology History  Malignant neoplasm of upper-outer quadrant of left breast in female, estrogen receptor positive ([place])  01/22/2019 Initial Diagnosis   Patient palpated a left breast mass. Mammogram showed a 0.9cm mass in the left breast at the 3 o'clock position, no left axillary adenopathy. 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    02/07/2019 Genetic Testing   Negative genetic testing: no pathogenic variants identified on the [facility] Breast Cancer STAT panel + Common Hereditary Cancers panel. 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 The report date is 02/07/2019.  The STAT Breast cancer panel offered by [facility] includes sequencing and rearrangement analysis for the following 9 genes:  ATM, BRCA1, BRCA2, CDH1, CHEK2, PALB2, PTEN, STK11 and TP53.  The Common Hereditary Cancers Panel offered by [facility] includes sequencing and/or deletion duplication testing of the following 47 genes: APC, ATM, AXIN2, BARD1, BMPR1A, BRCA1, BRCA2, BRIP1, CDH1, CDK4, CDKN2A (p14ARF), CDKN2A (p16INK4a), CHEK2, CTNNA1, DICER1, EPCAM (Deletion/duplication testing only), GREM1 (promoter region deletion/duplication testing only), KIT, MEN1, MLH1, MSH2, MSH3, MSH6, MUTYH, NBN, NF1, NHTL1, PALB2, PDGFRA, PMS2, POLD1, POLE, PTEN, RAD50, RAD51C, RAD51D, SDHB, SDHC, SDHD, SMAD4, SMARCA4. STK11, TP53, TSC1, TSC2, and VHL.  The  following genes were evaluated for sequence changes only: SDHA and HOXB13 c.251G>A variant only.     FAMILY HISTORY:  We obtained a detailed, 4-generation family history.  Significant diagnoses are listed below: Family History  Problem Relation Age of Onset   Breast cancer Sister        diagnosed in her 58s   Stroke Sister    Breast cancer Maternal Grandmother        diagnosed in her 23s or 61s   Breast cancer Sister        diagnosed in her 81s   Stroke Mother    Leukemia Maternal Aunt        diagnosed in her 72s   Colon cancer Neg Hx       Patricia Nunez has one son who is 37 and has not had cancer. She has six sisters and four brothers. Four of her sisters have died, and two of them had breast cancer that was diagnosed in their 79s. None of her other siblings have had cancer.  Patricia Nunez mother died at age 75 from a stroke. She had five maternal aunts and four maternal uncles. One of her aunts was diagnosed with leukemia in her 30s. Her other aunts and uncles died when they were older than 69. Patricia Nunez grandmother had breast cancer diagnosed in her 41s or 22s, and she does not have any information about her paternal grandfather.  Patricia Nunez father died at the age of 71 from a heart attack. She had five paternal aunts and one paternal uncle, none of whom had cancer. Her paternal grandmother died older than 57 form pneumonia, and she does not have information about her paternal grandfather. There are no known diagnoses of cancer on  the paternal side of the family.  Patricia Nunez is unaware of previous family history of genetic testing for hereditary cancer risks. Patient's maternal ancestors are of [place] American and Caucasian descent, and paternal ancestors are of African American descent. There is no reported Ashkenazi Jewish ancestry. There is no known consanguinity.  GENETIC TEST RESULTS: Genetic testing reported out on 02/07/2019 through the [facility] Breast Cancer  STAT panel + Common Hereditary Cancers panel found no pathogenic variants. The STAT Breast cancer panel offered by [facility] includes sequencing and rearrangement analysis for the following 9 genes:  ATM, BRCA1, BRCA2, CDH1, CHEK2, PALB2, PTEN, STK11 and TP53.  The Common Hereditary Cancers Panel offered by [facility] includes sequencing and/or deletion duplication testing of the following 47 genes: APC, ATM, AXIN2, BARD1, BMPR1A, BRCA1, BRCA2, BRIP1, CDH1, CDK4, CDKN2A (p14ARF), CDKN2A (p16INK4a), CHEK2, CTNNA1, DICER1, EPCAM (Deletion/duplication testing only), GREM1 (promoter region deletion/duplication testing only), KIT, MEN1, MLH1, MSH2, MSH3, MSH6, MUTYH, NBN, NF1, NHTL1, PALB2, PDGFRA, PMS2, POLD1, POLE, PTEN, RAD50, RAD51C, RAD51D, SDHB, SDHC, SDHD, SMAD4, SMARCA4. STK11, TP53, TSC1, TSC2, and VHL.  The following genes were evaluated for sequence changes only: SDHA and HOXB13 c.251G>A variant only. The test report will be scanned into EPIC and located under the Molecular Pathology section of the Results Review tab.  A portion of the result report is included below for reference.     We discussed with Patricia Nunez that because current genetic testing is not perfect, it is possible there may be a gene mutation in one of these genes that current testing cannot detect, but that chance is small.  We also discussed, that there could be another gene that has not yet been discovered, or that we have not yet tested, that is responsible for the cancer diagnoses in the family. It is also possible there is a hereditary cause for the cancer in the family that Patricia Nunez did not inherit and therefore was not identified in her testing.  Therefore, it is important to remain in touch with cancer genetics in the future so that we can continue to offer Patricia Nunez the most up to date genetic testing.   Genetic testing did identify a variant of uncertain significance (VUS) was identified in the BRCA2 gene called c.107C>T.  At  this time, it is unknown if this variant is associated with increased cancer risk or if this is a normal finding, but most variants such as this get reclassified to being inconsequential. It should not be used to make medical management decisions. With time, we suspect the lab will determine the significance of this variant, if any. If we do learn more about it, we will try to contact Patricia Nunez to discuss it further. However, it is important to stay in touch with [place] periodically and keep the address and phone number up to date.  CANCER SCREENING RECOMMENDATIONS: Patricia Nunez test result is considered negative (normal).  This means that we have not identified a hereditary cause for her personal and family history of cancer at this time. Most cancers happen by chance and this negative test suggests that her cancer may fall into this category.    While reassuring, this does not definitively rule out a hereditary predisposition to cancer. It is still possible that there could be genetic mutations that are undetectable by current technology. There could be genetic mutations in genes that have not been tested or identified to increase cancer risk.  Therefore, it is recommended she continue to follow the cancer management  and screening guidelines provided by her oncology and primary healthcare provider.   An individual's cancer risk and medical management are not determined by genetic test results alone. Overall cancer risk assessment incorporates additional factors, including personal medical history, family history, and any available genetic information that may result in a personalized plan for cancer prevention and surveillance  RECOMMENDATIONS FOR FAMILY MEMBERS:  Individuals in this family might be at some increased risk of developing cancer, over the general population risk, simply due to the family history of cancer.  We recommended women in this family have a yearly mammogram beginning at age 106, or 69  years younger than the earliest onset of cancer, an annual clinical breast exam, and perform monthly breast self-exams. Women in this family should also have a gynecological exam as recommended by their primary provider. All family members should have a colonoscopy by age 56.  FOLLOW-UP: Lastly, we discussed with Patricia Nunez that cancer genetics is a rapidly advancing field and it is possible that new genetic tests will be appropriate for her and/or her family members in the future. We encouraged her to remain in contact with cancer genetics on an annual basis so we can update her personal and family histories and let her know of advances in cancer genetics that may benefit this family.   Our contact number was provided. Patricia Nunez questions were answered to her satisfaction, and she knows she is welcome to call us at anytime with additional questions or concerns.   Clint Guy, MS, [facility] Certified Genetic Counselor [email or web address] Phone: [phone]

## 2019-02-08 NOTE — Telephone Encounter (Signed)
Scheduled appt per 10/22 sch message -pt is aware of appt date and time

## 2019-02-14 NOTE — Progress Notes (Signed)
[place], [place] - [place] [id number] [place] [address] Phone: [phone] Fax: [phone]      Your procedure is scheduled on 02/21/2019.  Report to [facility] Main Entrance "A" at 7:30 A.M., and check in at the Admitting office.  Call this number if you have problems the morning of surgery:  [phone]  Call [phone] if you have any questions prior to your surgery date Monday-Friday 8am-4pm    Remember:  Do not eat  after midnight the night before your surgery  You may drink clear liquids until 6:30 A.M. the morning of your surgery.   Clear liquids allowed are: Water, Non-Citrus Juices (without pulp), Carbonated Beverages, Clear Tea, Black Coffee Only, and Gatorade  Please complete your PRE-SURGERY ENSURE that was provided to you by 6:30 A.M.  the morning of surgery.  Please, if able, drink it in one setting. DO NOT SIP.    Take these medicines the morning of surgery with A SIP OF WATER: amLODipine (NORVASC) loratadine (CLARITIN) - if needed  As of today, STOP taking any Aspirin (unless otherwise instructed by your surgeon), Aleve, Naproxen, Ibuprofen, Motrin, Advil, Goody's, BC's, all herbal medications, fish oil, and all vitamins.    The Morning of Surgery  Do not wear jewelry, make-up or nail polish.  Do not wear lotions, powders, or perfumes or deodorant  Do not shave 48 hours prior to surgery.    Do not bring valuables to the hospital.  [facility] is not responsible for any belongings or valuables.  If you are a smoker, DO NOT Smoke 24 hours prior to surgery IF you wear a CPAP at night please bring your mask, tubing, and machine the morning of surgery   Remember that you must have someone to transport you home after your surgery, and remain with you for 24 hours if you are discharged the same day.   Contacts, glasses, hearing aids, dentures or bridgework may not be worn into surgery.    Leave your suitcase  in the car.  After surgery it may be brought to your room.  For patients admitted to the hospital, discharge time will be determined by your treatment team.  Patients discharged the day of surgery will not be allowed to drive home.    Special instructions:   [place]- Preparing For Surgery  Before surgery, you can play an important role. Because skin is not sterile, your skin needs to be as free of germs as possible. You can reduce the number of germs on your skin by washing with CHG (chlorahexidine gluconate) Soap before surgery.  CHG is an antiseptic cleaner which kills germs and bonds with the skin to continue killing germs even after washing.    Oral Hygiene is also important to reduce your risk of infection.  Remember - BRUSH YOUR TEETH THE MORNING OF SURGERY WITH YOUR REGULAR TOOTHPASTE  Please do not use if you have an allergy to CHG or antibacterial soaps. If your skin becomes reddened/irritated stop using the CHG.  Do not shave (including legs and underarms) for at least 48 hours prior to first CHG shower. It is OK to shave your face.  Please follow these instructions carefully.   1. Shower the NIGHT BEFORE SURGERY and the MORNING OF SURGERY with CHG Soap.   2. If you chose to wash your hair, wash your hair first as usual with your normal shampoo.  3. After you shampoo, rinse your hair and  body thoroughly to remove the shampoo.  4. Use CHG as you would any other liquid soap. You can apply CHG directly to the skin and wash gently with a scrungie or a clean washcloth.   5. Apply the CHG Soap to your body ONLY FROM THE NECK DOWN.  Do not use on open wounds or open sores. Avoid contact with your eyes, ears, mouth and genitals (private parts). Wash Face and genitals (private parts)  with your normal soap.   6. Wash thoroughly, paying special attention to the area where your surgery will be performed.  7. Thoroughly rinse your body with warm water from the neck down.  8. DO NOT  shower/wash with your normal soap after using and rinsing off the CHG Soap.  9. Pat yourself dry with a CLEAN TOWEL.  10. Wear CLEAN PAJAMAS to bed the night before surgery, wear comfortable clothes the morning of surgery  11. Place CLEAN SHEETS on your bed the night of your first shower and DO NOT SLEEP WITH PETS.    Day of Surgery:  Do not apply any deodorants/lotions. Please shower the morning of surgery with the CHG soap  Please wear clean clothes to the hospital/surgery center.   Remember to brush your teeth WITH YOUR REGULAR TOOTHPASTE.   Please read over the following fact sheets that you were given.

## 2019-02-15 ENCOUNTER — Other Ambulatory Visit: Payer: Self-pay

## 2019-02-15 ENCOUNTER — Encounter (HOSPITAL_COMMUNITY)
Admission: RE | Admit: 2019-02-15 | Discharge: 2019-02-15 | Disposition: A | Discharge status: Home / Self Care | Payer: Medicare Other | Source: Ambulatory Visit | Attending: Surgery | Admitting: Surgery

## 2019-02-15 ENCOUNTER — Encounter (HOSPITAL_COMMUNITY): Payer: Self-pay

## 2019-02-15 DIAGNOSIS — Z01818 Encounter for other preprocedural examination: Secondary | ICD-10-CM | POA: Diagnosis not present

## 2019-02-15 HISTORY — DX: Malignant (primary) neoplasm, unspecified: C80.1

## 2019-02-15 LAB — COMPREHENSIVE METABOLIC PANEL
ALT: 18 U/L (ref 0–44)
AST: 21 U/L (ref 15–41)
Albumin: 4.2 g/dL (ref 3.5–5.0)
Alkaline Phosphatase: 47 U/L (ref 38–126)
Anion gap: 7 (ref 5–15)
BUN: 16 mg/dL (ref 8–23)
CO2: 27 mmol/L (ref 22–32)
Calcium: 9.7 mg/dL (ref 8.9–10.3)
Chloride: 105 mmol/L (ref 98–111)
Creatinine, Ser: 0.79 mg/dL (ref 0.44–1.00)
GFR calc Af Amer: >60 mL/min (ref >60)
GFR calc non Af Amer: >60 mL/min (ref >60)
Glucose, Bld: 112 mg/dL — ABNORMAL HIGH (ref 70–99)
Potassium: 4.2 mmol/L (ref 3.5–5.1)
Sodium: 139 mmol/L (ref 135–145)
Total Bilirubin: 0.5 mg/dL (ref 0.3–1.2)
Total Protein: 6.7 g/dL (ref 6.5–8.1)

## 2019-02-15 LAB — CBC WITH DIFFERENTIAL/PLATELET
Abs Immature Granulocytes: 0.01 10*3/uL (ref 0.00–0.07)
Basophils Absolute: 0.1 10*3/uL (ref 0.0–0.1)
Basophils Relative: 2 %
Eosinophils Absolute: 1.5 10*3/uL — ABNORMAL HIGH (ref 0.0–0.5)
Eosinophils Relative: 29 %
HCT: 41.6 % (ref 36.0–46.0)
Hemoglobin: 13.4 g/dL (ref 12.0–15.0)
Immature Granulocytes: 0 %
Lymphocytes Relative: 32 %
Lymphs Abs: 1.6 10*3/uL (ref 0.7–4.0)
MCH: 29.5 pg (ref 26.0–34.0)
MCHC: 32.2 g/dL (ref 30.0–36.0)
MCV: 91.6 fL (ref 80.0–100.0)
Monocytes Absolute: 0.5 10*3/uL (ref 0.1–1.0)
Monocytes Relative: 10 %
Neutro Abs: 1.4 10*3/uL — ABNORMAL LOW (ref 1.7–7.7)
Neutrophils Relative %: 27 %
Platelets: 239 10*3/uL (ref 150–400)
RBC: 4.54 MIL/uL (ref 3.87–5.11)
RDW: 12.5 % (ref 11.5–15.5)
WBC: 5.2 10*3/uL (ref 4.0–10.5)
nRBC: 0 % (ref 0.0–0.2)

## 2019-02-15 NOTE — Progress Notes (Addendum)
PCP:  Dr. Iona Beard Cardiologist: Denies  EKG:  02/15/19 CXR:  06/19/18 ECHO:  denies Stress Test:  denies Cardiac Cath:  denies  Covid testing scheduled 02/18/19  Patient denies shortness of breath, fever, cough, and chest pain at PAT appointment.  Patient verbalized understanding of instructions provided today at the PAT appointment.  Patient asked to review instructions at home and day of surgery.

## 2019-02-18 ENCOUNTER — Other Ambulatory Visit: Payer: Self-pay

## 2019-02-18 ENCOUNTER — Other Ambulatory Visit (HOSPITAL_COMMUNITY)
Admission: RE | Admit: 2019-02-18 | Discharge: 2019-02-18 | Disposition: A | Discharge status: Home / Self Care | Payer: Medicare Other | Source: Ambulatory Visit | Attending: Surgery | Admitting: Surgery

## 2019-02-18 DIAGNOSIS — Z01812 Encounter for preprocedural laboratory examination: Secondary | ICD-10-CM | POA: Insufficient documentation

## 2019-02-18 DIAGNOSIS — Z20828 Contact with and (suspected) exposure to other viral communicable diseases: Secondary | ICD-10-CM | POA: Diagnosis not present

## 2019-02-18 LAB — SARS CORONAVIRUS 2 (TAT 6-24 HRS): SARS Coronavirus 2: NEGATIVE

## 2019-02-19 ENCOUNTER — Telehealth: Payer: Self-pay

## 2019-02-19 NOTE — Telephone Encounter (Signed)
Nutrition Assessment  Reason for Assessment:  Pt attended Breast Clinic on 10/14  ASSESSMENT:  63 year old female with left breast cancer.  Planning lumpectomy on 11/5 with oncotype testing.  Planning adjuvant radiation and antiestrogens.    Spoke with patient via phone to introduce self and service at [facility].  Patient reports good appetite and eating good sources of protein and plant foods.   Medications:  reviewed  Labs: reviewed  Anthropometrics:   Height: 65 inches Weight: 115 lb BMI: 18   NUTRITION DIAGNOSIS: Food and nutrition related knowledge deficit related to new diagnosis of breast cancer as evidenced by no prior need for nutrition related information.  INTERVENTION:   Discussed and provided packet of information regarding nutritional tips for breast cancer patients. No questions at this time.  Contact information provided and patient knows to contact me with questions/concerns.    MONITORING, EVALUATION, and GOAL: Pt will consume a healthy plant based diet to maintain lean body mass throughout treatment.   Patricia Nunez, [place], [place] Registered Dietitian [phone] (pager)

## 2019-02-20 ENCOUNTER — Ambulatory Visit
Admission: RE | Admit: 2019-02-20 | Discharge: 2019-02-20 | Disposition: A | Discharge status: Home / Self Care | Payer: Medicare Other | Source: Ambulatory Visit | Attending: Surgery | Admitting: Surgery

## 2019-02-20 ENCOUNTER — Other Ambulatory Visit: Payer: Self-pay

## 2019-02-20 DIAGNOSIS — C50912 Malignant neoplasm of unspecified site of left female breast: Secondary | ICD-10-CM

## 2019-02-21 ENCOUNTER — Encounter (HOSPITAL_COMMUNITY)
Admission: RE | Admit: 2019-02-21 | Discharge: 2019-02-21 | Disposition: A | Discharge status: Home / Self Care | Payer: Medicare Other | Source: Ambulatory Visit | Attending: Surgery | Admitting: Surgery

## 2019-02-21 ENCOUNTER — Encounter (HOSPITAL_COMMUNITY): Payer: Self-pay | Admitting: *Deleted

## 2019-02-21 ENCOUNTER — Ambulatory Visit
Admission: RE | Admit: 2019-02-21 | Discharge: 2019-02-21 | Disposition: A | Discharge status: Home / Self Care | Payer: Medicare Other | Source: Ambulatory Visit | Attending: Surgery | Admitting: Surgery

## 2019-02-21 ENCOUNTER — Ambulatory Visit (HOSPITAL_COMMUNITY)
Admission: RE | Admit: 2019-02-21 | Discharge: 2019-02-21 | Disposition: A | Discharge status: Home / Self Care | Payer: Medicare Other | Attending: Surgery | Admitting: Surgery

## 2019-02-21 ENCOUNTER — Ambulatory Visit (HOSPITAL_COMMUNITY): Payer: Medicare Other | Admitting: Anesthesiology

## 2019-02-21 ENCOUNTER — Encounter (HOSPITAL_COMMUNITY)
Admission: RE | Disposition: A | Discharge status: Home / Self Care | Payer: Self-pay | Source: Home / Self Care | Attending: Surgery

## 2019-02-21 ENCOUNTER — Other Ambulatory Visit: Payer: Self-pay

## 2019-02-21 DIAGNOSIS — Z803 Family history of malignant neoplasm of breast: Secondary | ICD-10-CM | POA: Diagnosis not present

## 2019-02-21 DIAGNOSIS — C50512 Malignant neoplasm of lower-outer quadrant of left female breast: Secondary | ICD-10-CM | POA: Insufficient documentation

## 2019-02-21 DIAGNOSIS — I10 Essential (primary) hypertension: Secondary | ICD-10-CM | POA: Insufficient documentation

## 2019-02-21 DIAGNOSIS — C50912 Malignant neoplasm of unspecified site of left female breast: Secondary | ICD-10-CM

## 2019-02-21 DIAGNOSIS — Z170 Estrogen receptor positive status [ER+]: Secondary | ICD-10-CM | POA: Diagnosis not present

## 2019-02-21 HISTORY — PX: BREAST LUMPECTOMY WITH RADIOACTIVE SEED AND SENTINEL LYMPH NODE BIOPSY: SHX6550

## 2019-02-21 HISTORY — PX: SENTINEL NODE BIOPSY: SHX6608

## 2019-02-21 SURGERY — BREAST LUMPECTOMY WITH RADIOACTIVE SEED AND SENTINEL LYMPH NODE BIOPSY
Anesthesia: General | Site: Breast | Laterality: Left

## 2019-02-21 MED ORDER — GABAPENTIN 300 MG PO CAPS
300.0000 mg | ORAL_CAPSULE | ORAL | Status: AC
  Administered 2019-02-21: 08:00:00 300 mg via ORAL

## 2019-02-21 MED ORDER — SODIUM CHLORIDE (PF) 0.9 % IJ SOLN
INTRAMUSCULAR | Status: AC
  Filled 2019-02-21: qty 10

## 2019-02-21 MED ORDER — ONDANSETRON HCL 4 MG/2ML IJ SOLN
INTRAMUSCULAR | Status: DC | PRN
  Administered 2019-02-21: 4 mg via INTRAVENOUS

## 2019-02-21 MED ORDER — 0.9 % SODIUM CHLORIDE (POUR BTL) OPTIME
TOPICAL | Status: DC | PRN
  Administered 2019-02-21: 1000 mL

## 2019-02-21 MED ORDER — ROPIVACAINE HCL 5 MG/ML IJ SOLN
INTRAMUSCULAR | Status: DC | PRN
  Administered 2019-02-21 (×6): 5 mL via PERINEURAL

## 2019-02-21 MED ORDER — TECHNETIUM TC 99M SULFUR COLLOID FILTERED
1.0000 | Freq: Once | INTRAVENOUS | Status: AC | PRN
  Administered 2019-02-21: 1 via INTRADERMAL

## 2019-02-21 MED ORDER — CEFAZOLIN SODIUM-DEXTROSE 2-4 GM/100ML-% IV SOLN
INTRAVENOUS | Status: AC
  Filled 2019-02-21: qty 100

## 2019-02-21 MED ORDER — OXYCODONE HCL 5 MG/5ML PO SOLN: 5.0000 mg | Freq: Once | ORAL | Status: DC | PRN

## 2019-02-21 MED ORDER — MIDAZOLAM HCL 2 MG/2ML IJ SOLN
2.0000 mg | Freq: Once | INTRAMUSCULAR | Status: AC
  Administered 2019-02-21: 09:00:00 2 mg via INTRAVENOUS

## 2019-02-21 MED ORDER — ACETAMINOPHEN 500 MG PO TABS
ORAL_TABLET | ORAL | Status: AC
  Administered 2019-02-21: 1000 mg via ORAL
  Filled 2019-02-21: qty 2

## 2019-02-21 MED ORDER — BUPIVACAINE HCL (PF) 0.5 % IJ SOLN
INTRAMUSCULAR | Status: DC | PRN
  Administered 2019-02-21: 12 mL

## 2019-02-21 MED ORDER — ACETAMINOPHEN 500 MG PO TABS
1000.0000 mg | ORAL_TABLET | ORAL | Status: AC
  Administered 2019-02-21: 08:00:00 1000 mg via ORAL

## 2019-02-21 MED ORDER — OXYCODONE HCL 5 MG PO TABS: 5.0000 mg | ORAL_TABLET | Freq: Once | ORAL | Status: DC | PRN

## 2019-02-21 MED ORDER — PROPOFOL 10 MG/ML IV BOLUS
INTRAVENOUS | Status: AC
  Filled 2019-02-21: qty 20

## 2019-02-21 MED ORDER — CEFAZOLIN SODIUM-DEXTROSE 2-4 GM/100ML-% IV SOLN
2.0000 g | INTRAVENOUS | Status: AC
  Administered 2019-02-21: 2 g via INTRAVENOUS

## 2019-02-21 MED ORDER — OXYCODONE HCL 5 MG PO TABS: 5.0000 mg | ORAL_TABLET | Freq: Four times a day (QID) | ORAL | 0 refills | Status: DC | PRN

## 2019-02-21 MED ORDER — KETOROLAC TROMETHAMINE 15 MG/ML IJ SOLN
INTRAMUSCULAR | Status: AC
  Administered 2019-02-21: 15 mg
  Filled 2019-02-21: qty 1

## 2019-02-21 MED ORDER — SCOPOLAMINE 1 MG/3DAYS TD PT72
1.0000 | MEDICATED_PATCH | TRANSDERMAL | Status: DC
  Administered 2019-02-21: 10:00:00 1.5 mg via TRANSDERMAL
  Filled 2019-02-21: qty 1

## 2019-02-21 MED ORDER — CHLORHEXIDINE GLUCONATE CLOTH 2 % EX PADS: 6.0000 | MEDICATED_PAD | Freq: Once | CUTANEOUS | Status: DC

## 2019-02-21 MED ORDER — DIPHENHYDRAMINE HCL 50 MG/ML IJ SOLN
INTRAMUSCULAR | Status: DC | PRN
  Administered 2019-02-21: 12.5 mg via INTRAVENOUS

## 2019-02-21 MED ORDER — MIDAZOLAM HCL 2 MG/2ML IJ SOLN
INTRAMUSCULAR | Status: AC
  Filled 2019-02-21: qty 2

## 2019-02-21 MED ORDER — FENTANYL CITRATE (PF) 250 MCG/5ML IJ SOLN
INTRAMUSCULAR | Status: AC
  Filled 2019-02-21: qty 5

## 2019-02-21 MED ORDER — BUPIVACAINE-EPINEPHRINE 0.5% -1:200000 IJ SOLN
INTRAMUSCULAR | Status: AC
  Filled 2019-02-21: qty 1

## 2019-02-21 MED ORDER — SCOPOLAMINE 1 MG/3DAYS TD PT72
MEDICATED_PATCH | TRANSDERMAL | Status: AC
  Administered 2019-02-21: 1.5 mg via TRANSDERMAL
  Filled 2019-02-21: qty 1

## 2019-02-21 MED ORDER — PHENYLEPHRINE HCL-NACL 10-0.9 MG/250ML-% IV SOLN
INTRAVENOUS | Status: DC | PRN
  Administered 2019-02-21: 25 ug/min via INTRAVENOUS

## 2019-02-21 MED ORDER — FENTANYL CITRATE (PF) 250 MCG/5ML IJ SOLN
INTRAMUSCULAR | Status: DC | PRN
  Administered 2019-02-21: 100 ug via INTRAVENOUS

## 2019-02-21 MED ORDER — PHENYLEPHRINE 40 MCG/ML (10ML) SYRINGE FOR IV PUSH (FOR BLOOD PRESSURE SUPPORT)
PREFILLED_SYRINGE | INTRAVENOUS | Status: DC | PRN
  Administered 2019-02-21 (×5): 80 ug via INTRAVENOUS

## 2019-02-21 MED ORDER — KETOROLAC TROMETHAMINE 15 MG/ML IJ SOLN: 15.0000 mg | Freq: Once | INTRAMUSCULAR | Status: DC

## 2019-02-21 MED ORDER — ACETAMINOPHEN 160 MG/5ML PO SOLN: 325.0000 mg | ORAL | Status: DC | PRN

## 2019-02-21 MED ORDER — GABAPENTIN 300 MG PO CAPS
ORAL_CAPSULE | ORAL | Status: AC
  Administered 2019-02-21: 300 mg via ORAL
  Filled 2019-02-21: qty 1

## 2019-02-21 MED ORDER — FENTANYL CITRATE (PF) 100 MCG/2ML IJ SOLN
INTRAMUSCULAR | Status: AC
  Administered 2019-02-21: 100 ug via INTRAVENOUS
  Filled 2019-02-21: qty 2

## 2019-02-21 MED ORDER — DEXAMETHASONE SODIUM PHOSPHATE 10 MG/ML IJ SOLN
INTRAMUSCULAR | Status: DC | PRN
  Administered 2019-02-21: 10 mg via INTRAVENOUS

## 2019-02-21 MED ORDER — ACETAMINOPHEN 325 MG PO TABS: 325.0000 mg | ORAL_TABLET | ORAL | Status: DC | PRN

## 2019-02-21 MED ORDER — LACTATED RINGERS IV SOLN
INTRAVENOUS | Status: DC
  Administered 2019-02-21 (×2): via INTRAVENOUS

## 2019-02-21 MED ORDER — FENTANYL CITRATE (PF) 100 MCG/2ML IJ SOLN: 25.0000 ug | INTRAMUSCULAR | Status: DC | PRN

## 2019-02-21 MED ORDER — MIDAZOLAM HCL 2 MG/2ML IJ SOLN
INTRAMUSCULAR | Status: AC
  Administered 2019-02-21: 2 mg via INTRAVENOUS
  Filled 2019-02-21: qty 2

## 2019-02-21 MED ORDER — MEPERIDINE HCL 25 MG/ML IJ SOLN: 6.2500 mg | INTRAMUSCULAR | Status: DC | PRN

## 2019-02-21 MED ORDER — IBUPROFEN 800 MG PO TABS: 800.0000 mg | ORAL_TABLET | Freq: Three times a day (TID) | ORAL | 0 refills | Status: DC | PRN

## 2019-02-21 MED ORDER — METHYLENE BLUE 0.5 % INJ SOLN
INTRAVENOUS | Status: AC
  Filled 2019-02-21: qty 10

## 2019-02-21 MED ORDER — PROPOFOL 10 MG/ML IV BOLUS
INTRAVENOUS | Status: DC | PRN
  Administered 2019-02-21: 150 mg via INTRAVENOUS

## 2019-02-21 MED ORDER — ONDANSETRON HCL 4 MG/2ML IJ SOLN: 4.0000 mg | Freq: Once | INTRAMUSCULAR | Status: DC | PRN

## 2019-02-21 MED ORDER — LIDOCAINE 2% (20 MG/ML) 5 ML SYRINGE
INTRAMUSCULAR | Status: DC | PRN
  Administered 2019-02-21: 100 mg via INTRAVENOUS

## 2019-02-21 MED ORDER — FENTANYL CITRATE (PF) 100 MCG/2ML IJ SOLN
100.0000 ug | Freq: Once | INTRAMUSCULAR | Status: AC
  Administered 2019-02-21: 09:00:00 100 ug via INTRAVENOUS

## 2019-02-21 SURGICAL SUPPLY — 44 items
ADH SKN CLS APL DERMABOND .7 (GAUZE/BANDAGES/DRESSINGS) ×2
APL PRP STRL LF DISP 70% ISPRP (MISCELLANEOUS) ×2
APPLIER CLIP 9.375 MED OPEN (MISCELLANEOUS) ×4
APR CLP MED 9.3 20 MLT OPN (MISCELLANEOUS) ×2
BINDER BREAST LRG (GAUZE/BANDAGES/DRESSINGS) ×2 IMPLANT
BINDER BREAST XLRG (GAUZE/BANDAGES/DRESSINGS) IMPLANT
CANISTER SUCT 3000ML PPV (MISCELLANEOUS) ×4 IMPLANT
CHLORAPREP W/TINT 26 (MISCELLANEOUS) ×4 IMPLANT
CLIP APPLIE 9.375 MED OPEN (MISCELLANEOUS) ×2 IMPLANT
CONT SPEC 4OZ CLIKSEAL STRL BL (MISCELLANEOUS) ×4 IMPLANT
COVER PROBE W GEL 5X96 (DRAPES) ×4 IMPLANT
COVER SURGICAL LIGHT HANDLE (MISCELLANEOUS) ×4 IMPLANT
COVER WAND RF STERILE (DRAPES) IMPLANT
DERMABOND ADVANCED (GAUZE/BANDAGES/DRESSINGS) ×2
DERMABOND ADVANCED .7 DNX12 (GAUZE/BANDAGES/DRESSINGS) ×2 IMPLANT
DEVICE DUBIN SPECIMEN MAMMOGRA (MISCELLANEOUS) ×4 IMPLANT
DRAPE CHEST BREAST 15X10 FENES (DRAPES) ×4 IMPLANT
ELECT CAUTERY BLADE 6.4 (BLADE) ×4 IMPLANT
ELECT REM PT RETURN 9FT ADLT (ELECTROSURGICAL) ×4
ELECTRODE REM PT RTRN 9FT ADLT (ELECTROSURGICAL) ×2 IMPLANT
GLOVE BIO SURGEON STRL SZ8 (GLOVE) ×4 IMPLANT
GLOVE BIOGEL PI IND STRL 8 (GLOVE) ×2 IMPLANT
GLOVE BIOGEL PI INDICATOR 8 (GLOVE) ×2
GOWN STRL REUS W/ TWL LRG LVL3 (GOWN DISPOSABLE) ×2 IMPLANT
GOWN STRL REUS W/ TWL XL LVL3 (GOWN DISPOSABLE) ×2 IMPLANT
GOWN STRL REUS W/TWL LRG LVL3 (GOWN DISPOSABLE) ×4
GOWN STRL REUS W/TWL XL LVL3 (GOWN DISPOSABLE) ×4
KIT BASIN OR (CUSTOM PROCEDURE TRAY) ×4 IMPLANT
KIT MARKER MARGIN INK (KITS) ×4 IMPLANT
LIGHT WAVEGUIDE WIDE FLAT (MISCELLANEOUS) IMPLANT
NDL 18GX1X1/2 (RX/OR ONLY) (NEEDLE) IMPLANT
NDL FILTER BLUNT 18X1 1/2 (NEEDLE) IMPLANT
NDL HYPO 25GX1X1/2 BEV (NEEDLE) ×2 IMPLANT
NEEDLE 18GX1X1/2 (RX/OR ONLY) (NEEDLE) IMPLANT
NEEDLE FILTER BLUNT 18X 1/2SAF (NEEDLE)
NEEDLE FILTER BLUNT 18X1 1/2 (NEEDLE) IMPLANT
NEEDLE HYPO 25GX1X1/2 BEV (NEEDLE) ×4 IMPLANT
NS IRRIG 1000ML POUR BTL (IV SOLUTION) ×4 IMPLANT
PACK GENERAL/GYN (CUSTOM PROCEDURE TRAY) ×4 IMPLANT
SUT MNCRL AB 4-0 PS2 18 (SUTURE) ×4 IMPLANT
SUT VIC AB 3-0 SH 18 (SUTURE) ×4 IMPLANT
SYR CONTROL 10ML LL (SYRINGE) ×4 IMPLANT
TOWEL GREEN STERILE (TOWEL DISPOSABLE) ×4 IMPLANT
TOWEL GREEN STERILE FF (TOWEL DISPOSABLE) ×4 IMPLANT

## 2019-02-21 NOTE — Interval H&P Note (Signed)
History and Physical Interval Note:  02/21/2019 8:51 AM  Patricia Nunez  has presented today for surgery, with the diagnosis of BREAST CANCER.  The various methods of treatment have been discussed with the patient and family. After consideration of risks, benefits and other options for treatment, the patient has consented to  Procedure(s): LEFT BREAST LUMPECTOMY WITH RADIOACTIVE SEED AND LEFT SENTINEL LYMPH NODE MAPPING (Left) as a surgical intervention.  The patient's history has been reviewed, patient examined, no change in status, stable for surgery.  I have reviewed the patient's chart and labs.  Questions were answered to the patient's satisfaction.     House

## 2019-02-21 NOTE — Op Note (Signed)
Preoperative diagnosis: Left breast cancer stage I lower outer quadrant  Postop diagnosis: Same  Procedure: Left breast seed lumpectomy left axillary sentinel lymph node mapping  Surgeon: Erroll Luna, MD  Anesthesia: LMA with local and pectoral block  EBL: Minimal  Specimen: Left breast tissue with seed and clip verified by Faxitron and to left axillary sentinel nodes hot  Drains: None  IV fluids: Per anesthesia record  Indications for procedure: Patient presents for left breast lumpectomy for stage I left breast cancer.  Risk, benefits and other surgical options of treatment were discussed as well as potential medical and radiation oncology issues.  She was seen by all specialist.  All options were discussed and she opted for breast conservation.The procedure has been discussed with the patient. Alternatives to surgery have been discussed with the patient.  Risks of surgery include bleeding,  Infection,  Seroma formation, death,  and the need for further surgery.   The patient understands and wishes to proceed.The procedure has been discussed with the patient.  Alternative therapies have been discussed with the patient.  Operative risks include bleeding,  Infection, arm swelling   Organ injury,  Nerve injury,  Blood vessel injury,  DVT,  Pulmonary embolism,  Death,  And possible reoperation.  Medical management risks include worsening of present situation.  The success of the procedure is 50 -90 % at treating patients symptoms.  The patient understands and agrees to proceed.  Description of procedure: The patient was met in the holding area.  Left breast was marked as correct side and seed verified by neoprobe.  Questions were answered.  She had a pectoral block placed and injection of technetium sulfur colloid per radiology protocol.  She is brought the operating.  She is placed upon upon the OR table.  Induction of general esthesia left breast and left axilla were prepped draped sterile  fashion timeout was performed.  Neoprobe used to identify the seed left breast lower outer quadrant.  Incision made along the inframammary fold laterally.  We dissected upward and excised the tumor with grossly negative margins.  The anterior margin that was left was solely skin the posterior margin was pectoralis muscle.  Hemostasis achieved.  Faxitron revealed seed and clip to be in specimen.  We then infiltrated the lumpectomy With local anesthetic consisting of 0.25% Sensorcaine.  This was closed in layers the deep layer 3-0 Vicryl and 4-0 Monocryl for the skin.  Neoprobe settings were changed technetium.  Hot node identified in left axilla and a 3 cm incision made the left axilla.  Dissection was carried down into the level 1 deep contents.  Identified 2 hot nodes removed.  Background counts are grade 0.  Hemostasis achieved.  The long thoracic nerve, thoracodorsal trunk and axillary vein were preserved.  Hemostasis achieved.  Wound closed with 3-0 Vicryl for Monocryl.  Dermabond applied.  Breast binder placed.  All counts correct.  Patient was awoke extubated taken recovery in satisfactory condition.

## 2019-02-21 NOTE — Anesthesia Procedure Notes (Addendum)
Procedure Name: Intubation Date/Time: 02/21/2019 10:02 AM Performed by: Wilburn Cornelia, CRNA Pre-anesthesia Checklist: Patient identified, Emergency Drugs available, Suction available, Patient being monitored and Timeout performed Patient Re-evaluated:Patient Re-evaluated prior to induction Oxygen Delivery Method: Circle system utilized Preoxygenation: Pre-oxygenation with 100% oxygen Induction Type: IV induction, Rapid sequence and Cricoid Pressure applied Ventilation: Mask ventilation without difficulty Laryngoscope Size: Mac and 3 Grade View: Grade I Tube type: Oral Tube size: 7.0 mm Number of attempts: 1 Airway Equipment and Method: Stylet Placement Confirmation: ETT inserted through vocal cords under direct vision,  positive ETCO2,  CO2 detector and breath sounds checked- equal and bilateral Secured at: 21 cm Tube secured with: Tape Dental Injury: Teeth and Oropharynx as per pre-operative assessment

## 2019-02-21 NOTE — Anesthesia Procedure Notes (Addendum)
Anesthesia Regional Block: Pectoralis block   Pre-Anesthetic Checklist: ,, timeout performed, Correct Patient, Correct Site, Correct Laterality, Correct Procedure, Correct Position, site marked, Risks and benefits discussed,  Surgical consent,  Pre-op evaluation,  At surgeon's request and post-op pain management  Laterality: Left and N/A  Prep: chloraprep       Needles:  Injection technique: Single-shot  Needle Type: Echogenic Stimulator Needle     Needle Length: 10cm  Needle Gauge: 21   Needle insertion depth: 2 cm   Additional Needles:   Procedures:,,,, ultrasound used (permanent image in chart),,,,  Narrative:  Start time: 02/21/2019 9:15 AM End time: 02/21/2019 9:25 AM Injection made incrementally with aspirations every 5 mL.  Performed by: Personally  Anesthesiologist: Lyn Hollingshead, MD

## 2019-02-21 NOTE — Transfer of Care (Signed)
Immediate Anesthesia Transfer of Care Note  Patient: Patricia Nunez  Procedure(s) Performed: LEFT BREAST LUMPECTOMY WITH RADIOACTIVE SEED (Left Breast) Left Sentinel Lymph Node Mapping (Left Axilla)  Patient Location: PACU  Anesthesia Type:General  Level of Consciousness: sedated  Airway & Oxygen Therapy: Patient Spontanous Breathing and Patient connected to nasal cannula oxygen  Post-op Assessment: Report given to RN and Post -op Vital signs reviewed and stable  Post vital signs: Reviewed and stable  Last Vitals:  Vitals Value Taken Time  BP 126/78 02/21/19 1112  Temp    Pulse 88 02/21/19 1112  Resp 18 02/21/19 1112  SpO2 100 % 02/21/19 1112  Vitals shown include unvalidated device data.  Last Pain:  Vitals:   02/21/19 0915  TempSrc:   PainSc: 0-No pain      Patients Stated Pain Goal: 7 ([id number])  Complications: No apparent anesthesia complications

## 2019-02-21 NOTE — Anesthesia Preprocedure Evaluation (Signed)
Anesthesia Evaluation  Patient identified by MRN, date of birth, ID band Patient awake    Reviewed: Allergy & Precautions, NPO status , Patient's Chart, lab work & pertinent test results  History of Anesthesia Complications (+) PONV and history of anesthetic complications  Airway Mallampati: I       Dental no notable dental hx. (+) Teeth Intact   Pulmonary neg pulmonary ROS,    Pulmonary exam normal breath sounds clear to auscultation       Cardiovascular hypertension, Pt. on medications Normal cardiovascular exam Rhythm:Regular Rate:Normal     Neuro/Psych negative neurological ROS  negative psych ROS   GI/Hepatic negative GI ROS, Neg liver ROS,   Endo/Other  negative endocrine ROS  Renal/GU negative Renal ROS  negative genitourinary   Musculoskeletal negative musculoskeletal ROS (+)   Abdominal Normal abdominal exam  (+)   Peds  Hematology negative hematology ROS (+)   Anesthesia Other Findings   Reproductive/Obstetrics                             Anesthesia Physical Anesthesia Plan  ASA: II  Anesthesia Plan: General   Post-op Pain Management:  Regional for Post-op pain   Induction: Intravenous  PONV Risk Score and Plan: 4 or greater and Ondansetron, Dexamethasone and Midazolam  Airway Management Planned: LMA  Additional Equipment: None  Intra-op Plan:   Post-operative Plan: Extubation in OR  Informed Consent: I have reviewed the patients History and Physical, chart, labs and discussed the procedure including the risks, benefits and alternatives for the proposed anesthesia with the patient or authorized representative who has indicated his/her understanding and acceptance.     Dental advisory given  Plan Discussed with: CRNA  Anesthesia Plan Comments:         Anesthesia Quick Evaluation

## 2019-02-21 NOTE — Anesthesia Postprocedure Evaluation (Signed)
Anesthesia Post Note  Patient: Patricia Nunez  Procedure(s) Performed: LEFT BREAST LUMPECTOMY WITH RADIOACTIVE SEED (Left Breast) Left Sentinel Lymph Node Mapping (Left Axilla)     Patient location during evaluation: PACU Anesthesia Type: General Level of consciousness: awake Pain management: pain level controlled Vital Signs Assessment: post-procedure vital signs reviewed and stable Respiratory status: spontaneous breathing Cardiovascular status: stable Postop Assessment: no apparent nausea or vomiting Anesthetic complications: no    Last Vitals:  Vitals:   02/21/19 1115 02/21/19 1128  BP:  124/77  Pulse: 87 86  Resp: 16 12  Temp:    SpO2: 100% 100%    Last Pain:  Vitals:   02/21/19 1127  TempSrc:   PainSc: 0-No pain   Pain Goal: Patients Stated Pain Goal: 7 (02/21/19 0823)                 Huston Foley

## 2019-02-21 NOTE — Discharge Instructions (Signed)
[facility] °Office Phone Number [phone] ° °BREAST BIOPSY/ PARTIAL MASTECTOMY: POST OP INSTRUCTIONS ° °Always review your discharge instruction sheet given to you by the facility where your surgery was performed. ° °IF YOU HAVE DISABILITY OR FAMILY LEAVE FORMS, YOU MUST BRING THEM TO THE OFFICE FOR PROCESSING.  DO NOT GIVE THEM TO YOUR DOCTOR. ° °1. A prescription for pain medication may be given to you upon discharge.  Take your pain medication as prescribed, if needed.  If narcotic pain medicine is not needed, then you may take acetaminophen (Tylenol) or ibuprofen (Advil) as needed. °2. Take your usually prescribed medications unless otherwise directed °3. If you need a refill on your pain medication, please contact your pharmacy.  They will contact our office to request authorization.  Prescriptions will not be filled after 5pm or on week-ends. °4. You should eat very light the first 24 hours after surgery, such as soup, crackers, pudding, etc.  Resume your normal diet the day after surgery. °5. Most patients will experience some swelling and bruising in the breast.  Ice packs and a good support bra will help.  Swelling and bruising can take several days to resolve.  °6. It is common to experience some constipation if taking pain medication after surgery.  Increasing fluid intake and taking a stool softener will usually help or prevent this problem from occurring.  A mild laxative (Milk of Magnesia or Miralax) should be taken according to package directions if there are no bowel movements after 48 hours. °7. Unless discharge instructions indicate otherwise, you may remove your bandages 24-48 hours after surgery, and you may shower at that time.  You may have steri-strips (small skin tapes) in place directly over the incision.  These strips should be left on the skin for 7-10 days.  If your surgeon used skin glue on the incision, you may shower in 24 hours.  The glue will flake off over the  next 2-3 weeks.  Any sutures or staples will be removed at the office during your follow-up visit. °8. ACTIVITIES:  You may resume regular daily activities (gradually increasing) beginning the next day.  Wearing a good support bra or sports bra minimizes pain and swelling.  You may have sexual intercourse when it is comfortable. °a. You may drive when you no longer are taking prescription pain medication, you can comfortably wear a seatbelt, and you can safely maneuver your car and apply brakes. °b. RETURN TO WORK:  ______________________________________________________________________________________ °9. You should see your doctor in the office for a follow-up appointment approximately two weeks after your surgery.  Your doctor’s nurse will typically make your follow-up appointment when she calls you with your pathology report.  Expect your pathology report 2-3 business days after your surgery.  You may call to check if you do not hear from us after three days. °10. OTHER INSTRUCTIONS: _______________________________________________________________________________________________ _____________________________________________________________________________________________________________________________________ °_____________________________________________________________________________________________________________________________________ °_____________________________________________________________________________________________________________________________________ ° °WHEN TO CALL YOUR DOCTOR: °1. Fever over 101.0 °2. Nausea and/or vomiting. °3. Extreme swelling or bruising. °4. Continued bleeding from incision. °5. Increased pain, redness, or drainage from the incision. ° °The clinic staff is available to answer your questions during regular business hours.  Please don’t hesitate to call and ask to speak to one of the nurses for clinical concerns.  If you have a medical emergency, go to the nearest  emergency room or call 911.  A surgeon from [facility] is always on call at the hospital. ° °For further questions, please visit [email or web address]  °

## 2019-02-22 ENCOUNTER — Encounter (HOSPITAL_COMMUNITY): Payer: Self-pay | Admitting: Surgery

## 2019-02-25 LAB — SURGICAL PATHOLOGY

## 2019-02-26 ENCOUNTER — Telehealth: Payer: Self-pay | Admitting: *Deleted

## 2019-02-26 NOTE — Telephone Encounter (Signed)
Received order for oncotype testing. Requisition faxed to [facility] and pathology

## 2019-02-27 NOTE — Progress Notes (Signed)
Patient Care Team: Iona Beard, MD as PCP - General (Family Medicine) Mauro Kaufmann, RN as Oncology Nurse Navigator Rockwell Germany, RN as Oncology Nurse Navigator Erroll Luna, MD as Consulting Physician (General Surgery) Nicholas Lose, MD as Consulting Physician (Hematology and Oncology) Gery Pray, MD as Consulting Physician (Radiation Oncology)  DIAGNOSIS:    ICD-10-CM   1. Malignant neoplasm of upper-outer quadrant of left breast in female, estrogen receptor positive ([place])  C50.412    Z17.0     SUMMARY OF ONCOLOGIC HISTORY: Oncology History  Malignant neoplasm of upper-outer quadrant of left breast in female, estrogen receptor positive ([place])  01/22/2019 Initial Diagnosis   Patient palpated a left breast mass. Mammogram showed a 0.9cm mass in the left breast at the 3 o'clock position, no left axillary adenopathy. 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    02/07/2019 Genetic Testing   Negative genetic testing: no pathogenic variants identified on the [facility] Breast Cancer STAT panel + Common Hereditary Cancers panel. 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 The report date is 02/07/2019.  The STAT Breast cancer panel offered by [facility] includes sequencing and rearrangement analysis for the following 9 genes:  ATM, BRCA1, BRCA2, CDH1, CHEK2, PALB2, PTEN, STK11 and TP53.  The Common Hereditary Cancers Panel offered by [facility] includes sequencing and/or deletion duplication testing of the following 47 genes: APC, ATM, AXIN2, BARD1, BMPR1A, BRCA1, BRCA2, BRIP1, CDH1, CDK4, CDKN2A (p14ARF), CDKN2A (p16INK4a), CHEK2, CTNNA1, DICER1, EPCAM (Deletion/duplication testing only), GREM1 (promoter region deletion/duplication testing only), KIT, MEN1, MLH1, MSH2, MSH3, MSH6, MUTYH, NBN, NF1, NHTL1, PALB2, PDGFRA, PMS2, POLD1, POLE, PTEN, RAD50, RAD51C, RAD51D, SDHB, SDHC, SDHD, SMAD4, SMARCA4. STK11, TP53, TSC1,  TSC2, and VHL.  The following genes were evaluated for sequence changes only: SDHA and HOXB13 c.251G>A variant only.   02/21/2019 Surgery   Left lumpectomy ([facility]): IDC, 0.9cm, grade 2, clear margins, 2 left axillary lymph nodes negative.     CHIEF COMPLIANT: Follow-up s/p lumpectomy to review pathology report  INTERVAL HISTORY: Patricia Nunez is a 63 y.o. with above-mentioned history of left breast cancer. Genetic testing was negative. She underwent a lumpectomy on 02/21/19 with Dr. Brantley Stage for which pathology showed invasivevductal carcinoma, 0.9cm, grade 2, clear margins and 2 left axillary lymph nodes negative for carcinoma. She presents to the clinic today to discuss the pathology report and further treatment.  She is healing and recovering very well from her recent surgery.  REVIEW OF SYSTEMS:   Constitutional: Denies fevers, chills or abnormal weight loss Eyes: Denies blurriness of vision Ears, nose, mouth, throat, and face: Denies mucositis or sore throat Respiratory: Denies cough, dyspnea or wheezes Cardiovascular: Denies palpitation, chest discomfort Gastrointestinal: Denies nausea, heartburn or change in bowel habits Skin: Denies abnormal skin rashes Lymphatics: Denies new lymphadenopathy or easy bruising Neurological: Denies numbness, tingling or new weaknesses Behavioral/Psych: Mood is stable, no new changes  Extremities: No lower extremity edema Breast: Recent left lumpectomy All other systems were reviewed with the patient and are negative.  I have reviewed the past medical history, past surgical history, social history and family history with the patient and they are unchanged from previous note.  ALLERGIES:  is allergic to hydromet [hydrocodone-homatropine].  MEDICATIONS:  Current Outpatient Medications  Medication Sig Dispense Refill  . alendronate (FOSAMAX) 70 MG tablet Take 70 mg by mouth once a week. Sundays    . amLODipine (NORVASC) 5 MG tablet Take  5 mg by  mouth daily.    Marland Kitchen aspirin 81 MG tablet Take 81 mg by mouth daily.      Marland Kitchen b complex vitamins tablet Take 1 tablet by mouth daily.    . calcium carbonate (OS-CAL) 600 MG TABS Take 600 mg by mouth daily with breakfast.     . cholecalciferol (VITAMIN D3) 25 MCG (1000 UT) tablet Take 1,000 Units by mouth daily.     Marland Kitchen ibuprofen (ADVIL) 800 MG tablet Take 1 tablet (800 mg total) by mouth every 8 (eight) hours as needed. 30 tablet 0  . loratadine (CLARITIN) 10 MG tablet Take 10 mg by mouth daily as needed for allergies.    . Multiple Vitamin (MULTIVITAMIN) capsule Take 1 capsule by mouth daily.      Marland Kitchen oxyCODONE (OXY IR/ROXICODONE) 5 MG immediate release tablet Take 1 tablet (5 mg total) by mouth every 6 (six) hours as needed for severe pain. 15 tablet 0  . vitamin E 100 UNIT capsule Take 100 Units by mouth daily.      Marland Kitchen zinc gluconate 50 MG tablet Take 50 mg by mouth daily.     No current facility-administered medications for this visit.     PHYSICAL EXAMINATION: ECOG PERFORMANCE STATUS: 1 - Symptomatic but completely ambulatory  Vitals:   02/28/19 1045  BP: 126/75  Pulse: (!) 104  Resp: 17  Temp: 98.7 F (37.1 C)  SpO2: 99%   Filed Weights   02/28/19 1045  Weight: 113 lb 9.6 oz (51.5 kg)    GENERAL: alert, no distress and comfortable SKIN: skin color, texture, turgor are normal, no rashes or significant lesions EYES: normal, Conjunctiva are pink and non-injected, sclera clear OROPHARYNX: no exudate, no erythema and lips, buccal mucosa, and tongue normal  NECK: supple, thyroid normal size, non-tender, without nodularity LYMPH: no palpable lymphadenopathy in the cervical, axillary or inguinal LUNGS: clear to auscultation and percussion with normal breathing effort HEART: regular rate & rhythm and no murmurs and no lower extremity edema ABDOMEN: abdomen soft, non-tender and normal bowel sounds MUSCULOSKELETAL: no cyanosis of digits and no clubbing  NEURO: alert & oriented x 3 with  fluent speech, no focal motor/sensory deficits EXTREMITIES: No lower extremity edema  LABORATORY DATA:  I have reviewed the data as listed CMP Latest Ref Rng & Units 02/15/2019 01/30/2019 03/05/2014  Glucose 70 - 99 mg/dL 112(H) 106(H) 109(H)  BUN 8 - 23 mg/dL _0 Creatinine 0.44 - 1.00 mg/dL 0.79 0.74 0.73  Sodium 135 - 145 mmol/L 139 140 141  Potassium 3.5 - 5.1 mmol/L 4.2 4.3 4.5  Chloride 98 - 111 mmol/L 105 103 102  CO2 22 - 32 mmol/L _1 Calcium 8.9 - 10.3 mg/dL 9.7 9.3 9.8  Total Protein 6.5 - 8.1 g/dL 6.7 7.2 -  Total Bilirubin 0.3 - 1.2 mg/dL 0.5 0.3 -  Alkaline Phos 38 - 126 U/L 47 55 -  AST 15 - 41 U/L 21 19 -  ALT 0 - 44 U/L 18 17 -    Lab Results  Component Value Date   WBC 5.2 02/15/2019   HGB 13.4 02/15/2019   HCT 41.6 02/15/2019   MCV 91.6 02/15/2019   PLT 239 02/15/2019   NEUTROABS 1.4 (L) 02/15/2019    ASSESSMENT & PLAN:  Malignant neoplasm of upper-outer quadrant of left breast in female, estrogen receptor positive ([place]) 01/22/2019:Patient palpated a left breast mass. Mammogram showed a 0.9cm mass in the left breast at the  3 o'clock position, no left axillary adenopathy. 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  T1BN0 stage Ia  Recommendations: 1. Breast conserving surgery followed by 2. Oncotype DX testing to determine if chemotherapy would be of any benefit followed by 3. Adjuvant radiation therapy followed by 4. Adjuvant antiestrogen therapy 5.  Genetics consult ------------------------------------------------------------------------------------------------------------------------------------------------- 02/21/2019: Left lumpectomy: Grade 2 IDC 0.9 cm with clear margins, 0/2 lymph nodes negative, ER 95%, PR 95%, Ki-67 15%, HER-2 negative, T1b N0 stage Ia  Pathology counseling: I discussed the final pathology report of the patient provided  a copy of this report. I discussed the margins as well as lymph node  surgeries. We also discussed the final staging along with previously performed ER/PR and HER-2/neu testing.  Return to clinic based upon Oncotype test results or after radiation therapy.    No orders of the defined types were placed in this encounter.  The patient has a good understanding of the overall plan. she agrees with it. she will call with any problems that may develop before the next visit here.  Nicholas Lose, MD 02/28/2019  Patricia Nunez am acting as scribe for Dr. Nicholas Lose.  I have reviewed the above documentation for accuracy and completeness, and I agree with the above.

## 2019-02-28 ENCOUNTER — Inpatient Hospital Stay: Payer: Medicare Other | Attending: Hematology and Oncology | Admitting: Hematology and Oncology

## 2019-02-28 ENCOUNTER — Other Ambulatory Visit: Payer: Self-pay

## 2019-02-28 DIAGNOSIS — Z170 Estrogen receptor positive status [ER+]: Secondary | ICD-10-CM | POA: Insufficient documentation

## 2019-02-28 DIAGNOSIS — C50412 Malignant neoplasm of upper-outer quadrant of left female breast: Secondary | ICD-10-CM

## 2019-02-28 NOTE — Assessment & Plan Note (Signed)
01/22/2019:Patient palpated a left breast mass. Mammogram showed a 0.9cm mass in the left breast at the 3 o'clock position, no left axillary adenopathy. 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  T1BN0 stage Ia  Recommendations: 1. Breast conserving surgery followed by 2. Oncotype DX testing to determine if chemotherapy would be of any benefit followed by 3. Adjuvant radiation therapy followed by 4. Adjuvant antiestrogen therapy 5.  Genetics consult ------------------------------------------------------------------------------------------------------------------------------------------------- 02/21/2019: Left lumpectomy: Grade 2 IDC 0.9 cm with clear margins, 0/2 lymph nodes negative, ER 95%, PR 95%, Ki-67 15%, HER-2 negative, T1b N0 stage Ia  Pathology counseling: I discussed the final pathology report of the patient provided  a copy of this report. I discussed the margins as well as lymph node surgeries. We also discussed the final staging along with previously performed ER/PR and HER-2/neu testing.  Return to clinic based upon Oncotype test results or after radiation therapy.

## 2019-03-05 ENCOUNTER — Telehealth: Payer: Self-pay | Admitting: *Deleted

## 2019-03-05 DIAGNOSIS — C50412 Malignant neoplasm of upper-outer quadrant of left female breast: Secondary | ICD-10-CM

## 2019-03-05 DIAGNOSIS — Z170 Estrogen receptor positive status [ER+]: Secondary | ICD-10-CM

## 2019-03-05 NOTE — Telephone Encounter (Signed)
Received oncotype results of 3/3%.  Patient is aware.  Referral placed for Dr. Sondra Come

## 2019-03-11 ENCOUNTER — Ambulatory Visit: Payer: Medicare Other | Attending: Surgery | Admitting: Physical Therapy

## 2019-03-11 ENCOUNTER — Other Ambulatory Visit: Payer: Self-pay

## 2019-03-11 ENCOUNTER — Encounter: Payer: Self-pay | Admitting: Physical Therapy

## 2019-03-11 DIAGNOSIS — R293 Abnormal posture: Secondary | ICD-10-CM | POA: Diagnosis present

## 2019-03-11 DIAGNOSIS — C50412 Malignant neoplasm of upper-outer quadrant of left female breast: Secondary | ICD-10-CM | POA: Diagnosis present

## 2019-03-11 DIAGNOSIS — Z483 Aftercare following surgery for neoplasm: Secondary | ICD-10-CM | POA: Diagnosis present

## 2019-03-11 DIAGNOSIS — Z170 Estrogen receptor positive status [ER+]: Secondary | ICD-10-CM | POA: Insufficient documentation

## 2019-03-11 NOTE — Therapy (Signed)
[place], [place], [address] Phone: [phone]   Fax:  [phone]  Physical Therapy Treatment  Patient Details  Name: Patricia Nunez MRN: [id number] Date of Birth: [birth year] Referring Provider (PT): Dr. Rosendo Gros   Encounter Date: 03/11/2019  PT End of Session - 03/11/19 1425    Visit Number  2    Number of Visits  2    PT Start Time  1402    PT Stop Time  1430    PT Time Calculation (min)  28 min    Activity Tolerance  Patient tolerated treatment well    Behavior During Therapy  [facility] for tasks assessed/performed       Past Medical History:  Diagnosis Date  . Cancer ([place])   . Colon polyps   . Family history of breast cancer   . Family history of leukemia   . Hypertension   . PONV (postoperative nausea and vomiting)   . Thyroid disease    h/o hyperactive, just being followed by endocrinologist    Past Surgical History:  Procedure Laterality Date  . ABDOMINAL HYSTERECTOMY  2000  . BREAST LUMPECTOMY WITH RADIOACTIVE SEED AND SENTINEL LYMPH NODE BIOPSY Left 02/21/2019   Procedure: LEFT BREAST LUMPECTOMY WITH RADIOACTIVE SEED;  Surgeon: Erroll Luna, MD;  Location: [place];  Service: General;  Laterality: Left;  . CATARACT EXTRACTION W/PHACO Left 03/10/2014   Procedure: CATARACT EXTRACTION PHACO AND INTRAOCULAR LENS PLACEMENT LEFT EYE;  Surgeon: Tonny Branch, MD;  Location: [facility];  Service: Ophthalmology;  Laterality: Left;  CDE 3.50  . CATARACT EXTRACTION W/PHACO Right 04/14/2014   Procedure: CATARACT EXTRACTION PHACO AND INTRAOCULAR LENS PLACEMENT ([facility]);  Surgeon: Tonny Branch, MD;  Location: [facility];  Service: Ophthalmology;  Laterality: Right;  CDE 3.35  . COLONOSCOPY  11/2008   Dr. Gala Romney: normal. Next colonoscopy in 2015 due to h/o polyps by Dr. Tamala Julian and path unknown  . COLONOSCOPY N/A 12/12/2014   Procedure: COLONOSCOPY;  Surgeon: Daneil Dolin, MD;  [facility];  Service:  Endoscopy;  Laterality: N/A;  1230  . GANGLION CYST EXCISION Left    x2-wrist- [place]  . SENTINEL NODE BIOPSY Left 02/21/2019   Procedure: Left Sentinel Lymph Node Mapping;  Surgeon: Erroll Luna, MD;  Location: [place];  Service: General;  Laterality: Left;    There were no vitals filed for this visit.  Subjective Assessment - 03/11/19 1409    Subjective  Patient reports she underwent a left lumpectomy and sentinel node biopsy (0/2 nodes positive) on 02/21/2019. Oncotype score was 3 but she will have radiation and anti-estrogen therapy.    Pertinent History  Patient was diagnosed on 01/16/2019 with right grade II invasive ductal carcinoma breast cancer. Patient reports she underwent a left lumpectomy and sentinel node biopsy (0/2 nodes positive) on 02/21/2019. It is ER/PR positive and HER2 negative with a Ki67 of 15%.    Patient Stated Goals  See if my arm is ok    Currently in Pain?  Yes    Pain Score  6     Pain Location  Axilla    Pain Orientation  Left    Pain Descriptors / Indicators  Sore    Pain Type  Surgical pain    Pain Onset  1 to 4 weeks ago    Pain Frequency  Intermittent    Aggravating Factors   Unknown    Pain Relieving Factors  Unknown    Multiple Pain  Sites  No         [facility] PT Assessment - 03/11/19 0001      Assessment   Medical Diagnosis  s/p left lumpectomy and SLNB    Referring Provider (PT)  Dr. Rober Minion [facility]    Onset Date/Surgical Date  02/21/19    Hand Dominance  Right    Prior Therapy  Baselines      Precautions   Precautions  Other (comment)    Precaution Comments  recent surgery      Restrictions   Weight Bearing Restrictions  No      Balance Screen   Has the patient fallen in the past 6 months  No    Has the patient had a decrease in activity level because of a fear of falling?   No    Is the patient reluctant to leave their home because of a fear of falling?   No      Home Environment   Living Environment  Private residence    Living  Arrangements  Alone    Available Help at Discharge  Family      Prior Function   Level of Independence  Independent    Vocation  Full time employment    Vocation Requirements  Has not returned to work yet    Leisure  She has returned to walking 3-4x/week      Cognition   Overall Cognitive Status  Within Functional Limits for tasks assessed      Observation/Other Assessments   Observations  Incisions in left axilla and inferior lateral left breast both appear to healing well. There is some scar thickness present with decreased tissue mobility.      Posture/Postural Control   Posture/Postural Control  Postural limitations    Postural Limitations  Rounded Shoulders;Forward head      ROM / Strength   AROM / PROM / Strength  AROM      AROM   AROM Assessment Site  Shoulder    Right/Left Shoulder  Left    Left Shoulder Extension  58 Degrees    Left Shoulder Flexion  146 Degrees    Left Shoulder ABduction  167 Degrees    Left Shoulder Internal Rotation  72 Degrees    Left Shoulder External Rotation  90 Degrees        LYMPHEDEMA/ONCOLOGY QUESTIONNAIRE - 03/11/19 1414      Type   Cancer Type  Left breast cancer      Surgeries   Lumpectomy Date  02/21/19    Sentinel Lymph Node Biopsy Date  02/21/19    Number Lymph Nodes Removed  2      Treatment   Active Chemotherapy Treatment  No    Past Chemotherapy Treatment  No    Active Radiation Treatment  No    Past Radiation Treatment  No    Current Hormone Treatment  No    Past Hormone Therapy  No      What other symptoms do you have   Are you Having Heaviness or Tightness  Yes    Are you having Pain  Yes    Are you having pitting edema  No    Is it Hard or Difficult finding clothes that fit  No    Do you have infections  No    Is there Decreased scar mobility  No    [place]emmer Sign  No      Lymphedema Assessments   Lymphedema Assessments  Upper extremities  Right Upper Extremity Lymphedema   10 cm Proximal to Olecranon  Process  23.2 cm    Olecranon Process  21.5 cm    10 cm Proximal to Ulnar Styloid Process  18.6 cm    Just Proximal to Ulnar Styloid Process  14 cm    Across Hand at [employer]  19.3 cm    At [place] of 2nd Digit  6.3 cm      Left Upper Extremity Lymphedema   10 cm Proximal to Olecranon Process  22.4 cm    Olecranon Process  21 cm    10 cm Proximal to Ulnar Styloid Process  17.8 cm    Just Proximal to Ulnar Styloid Process  13.6 cm    Across Hand at [employer]  18.4 cm    At [place] of 2nd Digit  5.9 cm        Quick Dash - 03/11/19 0001    Open a tight or new jar  Moderate difficulty    Do heavy household chores (wash walls, wash floors)  Moderate difficulty    Carry a shopping bag or briefcase  Moderate difficulty    Wash your back  Moderate difficulty    Use a knife to cut food  Moderate difficulty    Recreational activities in which you take some force or impact through your arm, shoulder, or hand (golf, hammering, tennis)  Moderate difficulty    During the past week, to what extent has your arm, shoulder or hand problem interfered with your normal social activities with family, friends, neighbors, or groups?  Modererately    During the past week, to what extent has your arm, shoulder or hand problem limited your work or other regular daily activities  Modererately    Arm, shoulder, or hand pain.  Moderate    Tingling (pins and needles) in your arm, shoulder, or hand  Moderate    Difficulty Sleeping  Moderate difficulty    DASH Score  50 %                     PT Education - 03/11/19 1425    Education Details  Educated pt on scar mobilization and use of coconut oil for reducing scar. She verbalized understanding.    Person(s) Educated  Patient    Methods  Explanation;Demonstration    Comprehension  Returned demonstration;Verbalized understanding          PT Long Term Goals - 03/11/19 1444      PT LONG TERM GOAL #1   Title  Patient will demonstrate  she has regained shoulder ROM and function post operatively compared to baselines.    Time  8    Period  Weeks    Status  Achieved            Plan - 03/11/19 1442    Clinical Impression Statement  Patient is doing very well s/p left lumpectomy and sentinel node biopsy. She had negative axillary nodes and a low Oncotype score so she will likely undergo radiation and anti-estrogen therapy. Her shoulder ROM is back to baseline, there are no signs of lymphedema, and her incisions are healing well. She was educated on lymphedema risk reduction and also scar massage so there is no other need for PT at this time.    PT Treatment/Interventions  ADLs/Self Care Home Management;Therapeutic exercise;Patient/family education    PT Next Visit Plan  D/C    PT Home Exercise Plan  Post op  shoulder ROM HEP    Consulted and Agree with Plan of Care  Patient       Patient will benefit from skilled therapeutic intervention in order to improve the following deficits and impairments:  Postural dysfunction, Decreased range of motion, Pain, Impaired UE functional use, Decreased knowledge of precautions  Visit Diagnosis: Malignant neoplasm of upper-outer quadrant of left breast in female, estrogen receptor positive ([facility])  Abnormal posture  Aftercare following surgery for neoplasm     Problem List Patient Active Problem List   Diagnosis Date Noted  . Genetic testing 02/07/2019  . Family history of breast cancer   . Family history of leukemia   . Malignant neoplasm of upper-outer quadrant of left breast in female, estrogen receptor positive ([place]) 01/22/2019  . History of colonic polyps 11/25/2014   PHYSICAL THERAPY DISCHARGE SUMMARY  Visits from Start of Care: 2  Current functional level related to goals / functional outcomes: Goals met. She reported "moderately limited" with all upper extremity function but reports this is due to not trying to use her arm yet. When assessed today, there appeared  to be normal ROM and no functional limitations.   Remaining deficits: None   Education / Equipment: HEP and lymphedema risk reduction. Plan: Patient agrees to discharge.  Patient goals were met. Patient is being discharged due to meeting the stated rehab goals.  ?????        Annia Friendly, [place] 03/11/19 2:46 PM   [place], [place], [address] Phone: [phone]   Fax:  [phone]  Name: Patricia Nunez MRN: [id number] Date of Birth: 01-Jul-1955

## 2019-03-12 ENCOUNTER — Encounter: Payer: Self-pay | Admitting: Hematology and Oncology

## 2019-03-13 ENCOUNTER — Ambulatory Visit
Admission: RE | Admit: 2019-03-13 | Discharge: 2019-03-13 | Disposition: A | Discharge status: Home / Self Care | Payer: Medicare Other | Source: Ambulatory Visit | Attending: Radiation Oncology | Admitting: Radiation Oncology

## 2019-03-13 ENCOUNTER — Encounter: Payer: Self-pay | Admitting: Radiation Oncology

## 2019-03-13 ENCOUNTER — Other Ambulatory Visit: Payer: Self-pay

## 2019-03-13 VITALS — BP 131/83 | HR 109 | Temp 98.0°F | Resp 18 | Ht 65.0 in | Wt 113.1 lb

## 2019-03-13 DIAGNOSIS — Z7982 Long term (current) use of aspirin: Secondary | ICD-10-CM | POA: Diagnosis not present

## 2019-03-13 DIAGNOSIS — Z170 Estrogen receptor positive status [ER+]: Secondary | ICD-10-CM

## 2019-03-13 DIAGNOSIS — C50412 Malignant neoplasm of upper-outer quadrant of left female breast: Secondary | ICD-10-CM

## 2019-03-13 DIAGNOSIS — Z79899 Other long term (current) drug therapy: Secondary | ICD-10-CM | POA: Diagnosis not present

## 2019-03-13 NOTE — Progress Notes (Signed)
Radiation Oncology         [phone] ________________________________  Name: Patricia Nunez MRN: [id number]  Date: 03/13/2019  DOB: July 19, 1955  Re-Evaluation Note  CC: Iona Beard, MD  Nicholas Lose, MD    ICD-10-CM   1. Malignant neoplasm of upper-outer quadrant of left breast in female, estrogen receptor positive ([place])  C50.412    Z17.0     Diagnosis: Clinical Stage T1b Left Breast UOQ, Invasive Ductal Carcinoma, ER+ / PR+ / Her2-, Grade 2  Narrative:  The patient returns today to discuss radiation treatment options. She was seen in breast clinic on 01/30/2019.   Since consultation, she underwent genetic testing on 01/30/2019. Results showed no pathogenic variants. However, there was one gene (BRCA2) that was of uncertain significance.  She opted to proceed with left breast lumpectomy with senitel nodal biopsy on 02/21/2019. Pathology from the procedure revealed invasive ductal carcinoma of left breast, 0.9 cm, Nottingham grade 2 of 3, without involvement of margins of resection. Two left axillary sentinel lymph nodes were biopsied and both were negative for carcinoma.  She followed up with Dr. Lindi Adie on 02/28/2019, who recommended follow-up in clinic upon Oncotype results or after radiation therapy.  Oncotype DX was obtained on the final surgical sample and the recurrence score of 3 predicts a risk of recurrence outside the breast over the next 9 years of 3%, if the patient's only systemic therapy is an antiestrogen for 5 years. It also predicts no significant benefit from chemotherapy.  On review of systems, the patient reports soreness in the left breast but no actual pain.. She denies swelling in her left arm or hand and any other symptoms.   Allergies:  is allergic to hydromet [hydrocodone-homatropine].  Meds: Current Outpatient Medications  Medication Sig Dispense Refill   alendronate (FOSAMAX) 70 MG tablet Take 70 mg by mouth once a week. Sundays     amLODipine  (NORVASC) 5 MG tablet Take 5 mg by mouth daily.     aspirin 81 MG tablet Take 81 mg by mouth daily.       b complex vitamins tablet Take 1 tablet by mouth daily.     Biotin 5 MG CAPS Take by mouth.     calcium carbonate (OS-CAL) 600 MG TABS Take 600 mg by mouth daily with breakfast.      cholecalciferol (VITAMIN D3) 25 MCG (1000 UT) tablet Take 1,000 Units by mouth daily.      Multiple Vitamin (MULTIVITAMIN) capsule Take 1 capsule by mouth daily.       vitamin E 100 UNIT capsule Take 100 Units by mouth daily.       zinc gluconate 50 MG tablet Take 50 mg by mouth daily.     No current facility-administered medications for this encounter.     Physical Findings: The patient is in no acute distress. Patient is alert and oriented.  height is '5\' 5"'  (1.651 m) and weight is 113 lb 2 oz (51.3 kg). Her temporal temperature is 98 F (36.7 C). Her blood pressure is 131/83 and her pulse is 109 (abnormal). Her respiration is 18 and oxygen saturation is 99%.   Lungs are clear to auscultation bilaterally. Heart has regular rate and rhythm. No palpable cervical, supraclavicular, or axillary adenopathy. Abdomen soft, non-tender, normal bowel sounds. Right Breast: no palpable mass, nipple discharge or bleeding.  Left Breast: Patient has a well-healing scar in the lateral aspect of the breast.  She has a second small scar in the axillary region  from her sentinel node procedure.  No signs of infection within the breast nipple discharge or bleeding.  No dominant mass palpated.  Some swelling noted in the breast  Lab Findings: Lab Results  Component Value Date   WBC 5.2 02/15/2019   HGB 13.4 02/15/2019   HCT 41.6 02/15/2019   MCV 91.6 02/15/2019   PLT 239 02/15/2019    Radiographic Findings: Nm Sentinel Node Inj-no Rpt (breast)  Result Date: 02/21/2019 Sulfur colloid was injected by the nuclear medicine technologist for melanoma sentinel node.   Mm Breast Surgical Specimen  Result Date:  02/21/2019 CLINICAL DATA:  Post left breast lumpectomy. EXAM: SPECIMEN RADIOGRAPH OF THE LEFT BREAST COMPARISON:  Previous exam(s). FINDINGS: Status post excision of the left breast. The radioactive seed and biopsy marker clip are present, completely intact, and were marked for pathology. IMPRESSION: Specimen radiograph of the left breast. Electronically Signed   By: Everlean Alstrom M.D.   On: 02/21/2019 10:34   Mm Lt Radioactive Seed Loc Mammo Guide  Result Date: 02/20/2019 CLINICAL DATA:  63 year old female for radioactive seed localization of LEFT breast cancer prior to lumpectomy EXAM: MAMMOGRAPHIC GUIDED RADIOACTIVE SEED LOCALIZATION OF THE LEFT BREAST COMPARISON:  Previous exam(s). FINDINGS: Patient presents for radioactive seed localization prior to LEFT lumpectomy. I met with the patient and we discussed the procedure of seed localization including benefits and alternatives. We discussed the high likelihood of a successful procedure. We discussed the risks of the procedure including infection, bleeding, tissue injury and further surgery. We discussed the low dose of radioactivity involved in the procedure. Informed, written consent was given. The usual time-out protocol was performed immediately prior to the procedure. Using mammographic guidance, sterile technique, 1% lidocaine and an I-125 radioactive seed, the RIBBON clip was localized using a LATERAL approach. The follow-up mammogram images confirm the seed in the expected location, 0.4 cm INFERIOR to the RIBBON clip, and were marked for Dr. Brantley Stage. Follow-up survey of the patient confirms presence of the radioactive seed. Order number of I-125 seed:  [id number]. Total activity:  1.497 millicuries.  Reference Date: 01/23/2019 The patient tolerated the procedure well and was released from the [facility]. She was given instructions regarding seed removal. IMPRESSION: Radioactive seed localization LEFT breast. No apparent complications.  Electronically Signed   By: Margarette Canada M.D.   On: 02/20/2019 13:18    Impression: Clinical Stage T1b Left Breast UOQ, Invasive Ductal Carcinoma, ER+ / PR+ / Her2-, Grade 2  Patient would be an excellent candidate for breast conservation with radiation therapy directed to the left breast.  I discussed the overall treatment course,  side effects and potential toxicities of radiation therapy in this situation with patient.  She appears to understand and wishes to proceed with planned course of treatment.  Plan: Patient does have some swelling in the breast and will delay her simulation until December 7th.  She would appear to be a good candidate for hypofractionated accelerated radiation therapy over approximately 3-1/2 to 4 weeks.  -----------------------------------  Blair Promise, PhD, MD  This document serves as a record of services personally performed by Gery Pray, MD. It was created on his behalf by Clerance Lav, a trained medical scribe. The creation of this record is based on the scribe's personal observations and the provider's statements to them. This document has been checked and approved by the attending provider.

## 2019-03-13 NOTE — Patient Instructions (Signed)
Coronavirus (COVID-19) Are you at risk?  Are you at risk for the Coronavirus (COVID-19)?  To be considered HIGH RISK for Coronavirus (COVID-19), you have to meet the following criteria:  . Traveled to [place], [place], [place], [place] or [place]; or in the [place] to [place], [place], [place], or [place]; and have fever, cough, and shortness of breath within the last 2 weeks of travel OR . Been in close contact with a person diagnosed with COVID-19 within the last 2 weeks and have fever, cough, and shortness of breath . IF YOU DO NOT MEET THESE CRITERIA, YOU ARE CONSIDERED LOW RISK FOR COVID-19.  What to do if you are HIGH RISK for COVID-19?  . If you are having a medical emergency, call 911. . Seek medical care right away. Before you go to a doctor's office, urgent care or emergency department, call ahead and tell them about your recent travel, contact with someone diagnosed with COVID-19, and your symptoms. You should receive instructions from your physician's office regarding next steps of care.  . When you arrive at healthcare provider, tell the healthcare staff immediately you have returned from visiting [place], [place], [place], [place] or [place]; or traveled in the [place] to [place], [place], [place], or [place]; in the last two weeks or you have been in close contact with a person diagnosed with COVID-19 in the last 2 weeks.   . Tell the health care staff about your symptoms: fever, cough and shortness of breath. . After you have been seen by a medical provider, you will be either: o Tested for (COVID-19) and discharged home on quarantine except to seek medical care if symptoms worsen, and asked to  - Stay home and avoid contact with others until you get your results (4-5 days)  - Avoid travel on public transportation if possible (such as bus, train, or airplane) or o Sent to the Emergency Department by EMS for evaluation, COVID-19 testing, and possible  admission depending on your condition and test results.  What to do if you are LOW RISK for COVID-19?  Reduce your risk of any infection by using the same precautions used for avoiding the common cold or flu:  . Wash your hands often with soap and warm water for at least 20 seconds.  If soap and water are not readily available, use an alcohol-based hand sanitizer with at least 60% alcohol.  . If coughing or sneezing, cover your mouth and nose by coughing or sneezing into the elbow areas of your shirt or coat, into a tissue or into your sleeve (not your hands). . Avoid shaking hands with others and consider head nods or verbal greetings only. . Avoid touching your eyes, nose, or mouth with unwashed hands.  . Avoid close contact with people who are sick. . Avoid places or events with large numbers of people in one location, like concerts or sporting events. . Carefully consider travel plans you have or are making. . If you are planning any travel outside or inside the [place], visit the CDC's Travelers' Health webpage for the latest health notices. . If you have some symptoms but not all symptoms, continue to monitor at home and seek medical attention if your symptoms worsen. . If you are having a medical emergency, call 911.   ADDITIONAL HEALTHCARE OPTIONS FOR PATIENTS  [facility] / e-Visit: [email or web address]         [facility]: [phone]  [facility]: [phone]                   [facility]: [phone]   

## 2019-03-13 NOTE — Progress Notes (Signed)
Location of Breast Cancer: Malignant neoplasm of upper-outer quadrant of left breast in female, estrogen receptor positive ([place])  Histology per Pathology Report: 02/21/19: FINAL MICROSCOPIC DIAGNOSIS:   A. BREAST, LEFT, LUMPECTOMY:  - Invasive ductal carcinoma, 0.9 cm, Nottingham grade 2 of 3.  - Margins of resection are not involved (Closest margin: 3 mm,  anterior).  - Biopsy site changes.  - See oncology table.   B. SENTINEL LYMPH NODE, LEFT AXILLARY, BIOPSY:  - One lymph node, negative for carcinoma (0/1).   C. SENTINEL LYMPH NODE, LEFT AXILLARY, BIOPSY:  - One lymph node, negative for carcinoma (0/1).   Receptor Status: ER(95%), PR (95%), Her2-neu (negative), Ki-(15%)  Did patient present with symptoms (if so, please note symptoms) or was this found on screening mammography?: She presented with a palpable abnormality in the left breast. Of note, her most screening mammogram was on 10/24/2018. She underwent left diagnostic mammography with tomography and left breast ultrasonography at [facility] on 01/16/2019 showing: 9 mm suspicious mass in the left breast at 3 o'clock.  Biopsy on 01/18/2019 showed: invasive ductal carcinoma, grade 2. Prognostic indicators significant for: estrogen receptor, 95% positive and progesterone receptor, 95% positive, both with strong staining intensity. Proliferation marker Ki67 at 15%. HER2 negative by FISH.  Past/Anticipated interventions by surgeon, if any: Procedure: Left breast seed lumpectomy left axillary sentinel lymph node mapping  Surgeon: Erroll Luna, MD  Past/Anticipated interventions by medical oncology, if any: Chemotherapy Per Dr. Lindi Adie 11/12: Recommendations: 1. Breast conserving surgery followed by 2. Oncotype DX testing to determine if chemotherapy would be of any benefit followed by 3. Adjuvant radiation therapy followed by 4. Adjuvant antiestrogen therapy 5.Genetics  consult ------------------------------------------------------------------------------------------------------------------------------------------------- 02/21/2019: Left lumpectomy: Grade 2 IDC 0.9 cm with clear margins, 0/2 lymph nodes negative, ER 95%, PR 95%, Ki-67 15%, HER-2 negative, T1b N0 stage Ia  Pathology counseling: I discussed the final pathology report of the patient provided  a copy of this report. I discussed the margins as well as lymph node surgeries. We also discussed the final staging along with previously performed ER/PR and HER-2/neu testing.  Return to clinic based upon Oncotype test results or after radiation therapy.  Lymphedema issues, if any: Pt participates in PT and is using compression bra, doing exercises, and massaging with coconut oil per physical therapist.    Pain issues, if any:  Pt reports soreness under the upper part of arm and at incision site. Pt rates 3/10.    SAFETY ISSUES:  Prior radiation? No  Pacemaker/ICD? No  Possible current pregnancy? No  Is the patient on methotrexate? No  Current Complaints / other details:  Pt presents today for consult with Dr. Sondra Come for Radiation Oncology.  BP 131/83 (BP Location: Right Arm, Patient Position: Sitting)   Pulse (!) 109   Temp 98 F (36.7 C) (Temporal)   Resp 18   Ht _0  (1.651 m)   Wt 113 lb 2 oz (51.3 kg)   SpO2 99%   BMI 18.82 kg/m   Wt Readings from Last 3 Encounters:  03/13/19 113 lb 2 oz (51.3 kg)  02/28/19 113 lb 9.6 oz (51.5 kg)  02/21/19 113 lb 8.6 oz (51.5 kg)       Loma Sousa, RN 03/13/2019,12:48 PM

## 2019-03-18 ENCOUNTER — Encounter: Payer: Self-pay | Admitting: *Deleted

## 2019-03-25 ENCOUNTER — Ambulatory Visit
Admission: RE | Admit: 2019-03-25 | Discharge: 2019-03-25 | Disposition: A | Discharge status: Home / Self Care | Payer: Medicare Other | Source: Ambulatory Visit | Attending: Radiation Oncology | Admitting: Radiation Oncology

## 2019-03-25 ENCOUNTER — Other Ambulatory Visit: Payer: Self-pay

## 2019-03-25 DIAGNOSIS — Z170 Estrogen receptor positive status [ER+]: Secondary | ICD-10-CM | POA: Diagnosis present

## 2019-03-25 DIAGNOSIS — C50412 Malignant neoplasm of upper-outer quadrant of left female breast: Secondary | ICD-10-CM | POA: Insufficient documentation

## 2019-03-26 ENCOUNTER — Telehealth: Payer: Self-pay | Admitting: Hematology and Oncology

## 2019-03-26 NOTE — Telephone Encounter (Signed)
Scheduled appt per 12/7 sch message - pt is aware of date and time

## 2019-03-28 DIAGNOSIS — C50412 Malignant neoplasm of upper-outer quadrant of left female breast: Secondary | ICD-10-CM | POA: Diagnosis not present

## 2019-04-01 ENCOUNTER — Other Ambulatory Visit: Payer: Self-pay

## 2019-04-01 ENCOUNTER — Ambulatory Visit
Admission: RE | Admit: 2019-04-01 | Discharge: 2019-04-01 | Disposition: A | Discharge status: Home / Self Care | Payer: Medicare Other | Source: Ambulatory Visit | Attending: Radiation Oncology | Admitting: Radiation Oncology

## 2019-04-01 ENCOUNTER — Encounter: Payer: Self-pay | Admitting: *Deleted

## 2019-04-01 DIAGNOSIS — Z170 Estrogen receptor positive status [ER+]: Secondary | ICD-10-CM

## 2019-04-01 DIAGNOSIS — C50412 Malignant neoplasm of upper-outer quadrant of left female breast: Secondary | ICD-10-CM

## 2019-04-01 NOTE — Progress Notes (Signed)
  Radiation Oncology         [phone] ________________________________  Name: Patricia Nunez MRN: [id number]  Date: 04/01/2019  DOB: 1955/07/25  Simulation Verification Note    ICD-10-CM   1. Malignant neoplasm of upper-outer quadrant of left breast in female, estrogen receptor positive ([place])  C50.412    Z17.0     Status: outpatient  NARRATIVE: The patient was brought to the treatment unit and placed in the planned treatment position. The clinical setup was verified. Then port films were obtained and uploaded to the radiation oncology medical record software.  The treatment beams were carefully compared against the planned radiation fields. The position location and shape of the radiation fields was reviewed. They targeted volume of tissue appears to be appropriately covered by the radiation beams. Organs at risk appear to be excluded as planned.  Based on my personal review, I approved the simulation verification. The patient's treatment will proceed as planned.  -----------------------------------  Blair Promise, PhD, MD

## 2019-04-02 ENCOUNTER — Ambulatory Visit
Admission: RE | Admit: 2019-04-02 | Discharge: 2019-04-02 | Disposition: A | Discharge status: Home / Self Care | Payer: Medicare Other | Source: Ambulatory Visit | Attending: Radiation Oncology | Admitting: Radiation Oncology

## 2019-04-02 ENCOUNTER — Other Ambulatory Visit: Payer: Self-pay

## 2019-04-02 DIAGNOSIS — C50412 Malignant neoplasm of upper-outer quadrant of left female breast: Secondary | ICD-10-CM | POA: Diagnosis not present

## 2019-04-02 MED ORDER — ALRA NON-METALLIC DEODORANT (RAD-ONC)
1.0000 "application " | Freq: Once | TOPICAL | Status: AC
  Administered 2019-04-02: 1 via TOPICAL

## 2019-04-02 MED ORDER — SONAFINE EX EMUL
1.0000 "application " | Freq: Two times a day (BID) | CUTANEOUS | Status: DC
  Administered 2019-04-02: 1 via TOPICAL

## 2019-04-02 NOTE — Progress Notes (Signed)
Pt here for patient teaching.    Pt given Radiation and You booklet, skin care instructions, Alra deodorant and Sonafine.    Reviewed areas of pertinence such as fatigue, hair loss, nausea and vomiting, skin changes, breast tenderness, breast swelling and shortness of breath .   Pt able to give teach back of to pat skin, use unscented/gentle soap and drink plenty of water,apply Sonafine bid, avoid applying anything to skin within 4 hours of treatment, avoid wearing an under wire bra and to use an electric razor if they must shave.   Pt verbalizes understanding of information given and will contact nursing with any questions or concerns.

## 2019-04-03 ENCOUNTER — Ambulatory Visit
Admission: RE | Admit: 2019-04-03 | Discharge: 2019-04-03 | Disposition: A | Discharge status: Home / Self Care | Payer: Medicare Other | Source: Ambulatory Visit | Attending: Radiation Oncology | Admitting: Radiation Oncology

## 2019-04-03 ENCOUNTER — Other Ambulatory Visit: Payer: Self-pay

## 2019-04-03 DIAGNOSIS — C50412 Malignant neoplasm of upper-outer quadrant of left female breast: Secondary | ICD-10-CM | POA: Diagnosis not present

## 2019-04-04 ENCOUNTER — Ambulatory Visit
Admission: RE | Admit: 2019-04-04 | Discharge: 2019-04-04 | Disposition: A | Discharge status: Home / Self Care | Payer: Medicare Other | Source: Ambulatory Visit | Attending: Radiation Oncology | Admitting: Radiation Oncology

## 2019-04-04 ENCOUNTER — Other Ambulatory Visit: Payer: Self-pay

## 2019-04-04 DIAGNOSIS — C50412 Malignant neoplasm of upper-outer quadrant of left female breast: Secondary | ICD-10-CM | POA: Diagnosis not present

## 2019-04-05 ENCOUNTER — Other Ambulatory Visit: Payer: Self-pay

## 2019-04-05 ENCOUNTER — Ambulatory Visit
Admission: RE | Admit: 2019-04-05 | Discharge: 2019-04-05 | Disposition: A | Discharge status: Home / Self Care | Payer: Medicare Other | Source: Ambulatory Visit | Attending: Radiation Oncology | Admitting: Radiation Oncology

## 2019-04-05 DIAGNOSIS — C50412 Malignant neoplasm of upper-outer quadrant of left female breast: Secondary | ICD-10-CM | POA: Diagnosis not present

## 2019-04-08 ENCOUNTER — Other Ambulatory Visit: Payer: Self-pay

## 2019-04-08 ENCOUNTER — Ambulatory Visit
Admission: RE | Admit: 2019-04-08 | Discharge: 2019-04-08 | Disposition: A | Discharge status: Home / Self Care | Payer: Medicare Other | Source: Ambulatory Visit | Attending: Radiation Oncology | Admitting: Radiation Oncology

## 2019-04-08 DIAGNOSIS — C50412 Malignant neoplasm of upper-outer quadrant of left female breast: Secondary | ICD-10-CM | POA: Diagnosis not present

## 2019-04-09 ENCOUNTER — Other Ambulatory Visit: Payer: Self-pay

## 2019-04-09 ENCOUNTER — Ambulatory Visit
Admission: RE | Admit: 2019-04-09 | Discharge: 2019-04-09 | Disposition: A | Discharge status: Home / Self Care | Payer: Medicare Other | Source: Ambulatory Visit | Attending: Radiation Oncology | Admitting: Radiation Oncology

## 2019-04-09 DIAGNOSIS — C50412 Malignant neoplasm of upper-outer quadrant of left female breast: Secondary | ICD-10-CM | POA: Diagnosis not present

## 2019-04-10 ENCOUNTER — Ambulatory Visit
Admission: RE | Admit: 2019-04-10 | Discharge: 2019-04-10 | Disposition: A | Discharge status: Home / Self Care | Payer: Medicare Other | Source: Ambulatory Visit | Attending: Radiation Oncology | Admitting: Radiation Oncology

## 2019-04-10 ENCOUNTER — Other Ambulatory Visit: Payer: Self-pay

## 2019-04-10 DIAGNOSIS — C50412 Malignant neoplasm of upper-outer quadrant of left female breast: Secondary | ICD-10-CM | POA: Diagnosis not present

## 2019-04-11 ENCOUNTER — Ambulatory Visit
Admission: RE | Admit: 2019-04-11 | Discharge: 2019-04-11 | Disposition: A | Discharge status: Home / Self Care | Payer: Medicare Other | Source: Ambulatory Visit | Attending: Radiation Oncology | Admitting: Radiation Oncology

## 2019-04-11 ENCOUNTER — Other Ambulatory Visit: Payer: Self-pay

## 2019-04-11 DIAGNOSIS — C50412 Malignant neoplasm of upper-outer quadrant of left female breast: Secondary | ICD-10-CM | POA: Diagnosis not present

## 2019-04-15 ENCOUNTER — Other Ambulatory Visit: Payer: Self-pay

## 2019-04-15 ENCOUNTER — Ambulatory Visit
Admission: RE | Admit: 2019-04-15 | Discharge: 2019-04-15 | Disposition: A | Discharge status: Home / Self Care | Payer: Medicare Other | Source: Ambulatory Visit | Attending: Radiation Oncology | Admitting: Radiation Oncology

## 2019-04-15 DIAGNOSIS — C50412 Malignant neoplasm of upper-outer quadrant of left female breast: Secondary | ICD-10-CM | POA: Diagnosis not present

## 2019-04-16 ENCOUNTER — Ambulatory Visit
Admission: RE | Admit: 2019-04-16 | Discharge: 2019-04-16 | Disposition: A | Discharge status: Home / Self Care | Payer: Medicare Other | Source: Ambulatory Visit | Attending: Radiation Oncology | Admitting: Radiation Oncology

## 2019-04-16 ENCOUNTER — Other Ambulatory Visit: Payer: Self-pay

## 2019-04-16 DIAGNOSIS — C50412 Malignant neoplasm of upper-outer quadrant of left female breast: Secondary | ICD-10-CM | POA: Diagnosis not present

## 2019-04-17 ENCOUNTER — Ambulatory Visit
Admission: RE | Admit: 2019-04-17 | Discharge: 2019-04-17 | Disposition: A | Discharge status: Home / Self Care | Payer: Medicare Other | Source: Ambulatory Visit | Attending: Radiation Oncology | Admitting: Radiation Oncology

## 2019-04-17 ENCOUNTER — Other Ambulatory Visit: Payer: Self-pay

## 2019-04-17 DIAGNOSIS — C50412 Malignant neoplasm of upper-outer quadrant of left female breast: Secondary | ICD-10-CM | POA: Diagnosis not present

## 2019-04-18 ENCOUNTER — Other Ambulatory Visit: Payer: Self-pay

## 2019-04-18 ENCOUNTER — Ambulatory Visit
Admission: RE | Admit: 2019-04-18 | Discharge: 2019-04-18 | Disposition: A | Discharge status: Home / Self Care | Payer: Medicare Other | Source: Ambulatory Visit | Attending: Radiation Oncology | Admitting: Radiation Oncology

## 2019-04-18 DIAGNOSIS — C50412 Malignant neoplasm of upper-outer quadrant of left female breast: Secondary | ICD-10-CM | POA: Diagnosis not present

## 2019-04-22 ENCOUNTER — Other Ambulatory Visit: Payer: Self-pay

## 2019-04-22 ENCOUNTER — Ambulatory Visit
Admission: RE | Admit: 2019-04-22 | Discharge: 2019-04-22 | Disposition: A | Discharge status: Home / Self Care | Payer: Medicare PPO | Source: Ambulatory Visit | Attending: Radiation Oncology | Admitting: Radiation Oncology

## 2019-04-22 DIAGNOSIS — C50412 Malignant neoplasm of upper-outer quadrant of left female breast: Secondary | ICD-10-CM | POA: Diagnosis not present

## 2019-04-22 DIAGNOSIS — Z510 Encounter for antineoplastic radiation therapy: Secondary | ICD-10-CM | POA: Insufficient documentation

## 2019-04-23 ENCOUNTER — Ambulatory Visit: Payer: Medicare PPO | Admitting: Radiation Oncology

## 2019-04-23 ENCOUNTER — Ambulatory Visit
Admission: RE | Admit: 2019-04-23 | Discharge: 2019-04-23 | Disposition: A | Discharge status: Home / Self Care | Payer: Medicare PPO | Source: Ambulatory Visit | Attending: Radiation Oncology | Admitting: Radiation Oncology

## 2019-04-23 ENCOUNTER — Other Ambulatory Visit: Payer: Self-pay

## 2019-04-23 DIAGNOSIS — C50412 Malignant neoplasm of upper-outer quadrant of left female breast: Secondary | ICD-10-CM | POA: Diagnosis not present

## 2019-04-23 DIAGNOSIS — Z510 Encounter for antineoplastic radiation therapy: Secondary | ICD-10-CM | POA: Diagnosis not present

## 2019-04-23 DIAGNOSIS — J209 Acute bronchitis, unspecified: Secondary | ICD-10-CM | POA: Diagnosis not present

## 2019-04-24 ENCOUNTER — Other Ambulatory Visit: Payer: Self-pay

## 2019-04-24 ENCOUNTER — Ambulatory Visit
Admission: RE | Admit: 2019-04-24 | Discharge: 2019-04-24 | Disposition: A | Discharge status: Home / Self Care | Payer: Medicare PPO | Source: Ambulatory Visit | Attending: Radiation Oncology | Admitting: Radiation Oncology

## 2019-04-24 DIAGNOSIS — C50412 Malignant neoplasm of upper-outer quadrant of left female breast: Secondary | ICD-10-CM | POA: Diagnosis not present

## 2019-04-24 DIAGNOSIS — Z510 Encounter for antineoplastic radiation therapy: Secondary | ICD-10-CM | POA: Diagnosis not present

## 2019-04-25 ENCOUNTER — Ambulatory Visit
Admission: RE | Admit: 2019-04-25 | Discharge: 2019-04-25 | Disposition: A | Discharge status: Home / Self Care | Payer: Medicare PPO | Source: Ambulatory Visit | Attending: Radiation Oncology | Admitting: Radiation Oncology

## 2019-04-25 ENCOUNTER — Other Ambulatory Visit: Payer: Self-pay

## 2019-04-25 DIAGNOSIS — Z510 Encounter for antineoplastic radiation therapy: Secondary | ICD-10-CM | POA: Diagnosis not present

## 2019-04-25 DIAGNOSIS — C50412 Malignant neoplasm of upper-outer quadrant of left female breast: Secondary | ICD-10-CM | POA: Diagnosis not present

## 2019-04-26 ENCOUNTER — Ambulatory Visit
Admission: RE | Admit: 2019-04-26 | Discharge: 2019-04-26 | Disposition: A | Discharge status: Home / Self Care | Payer: Medicare PPO | Source: Ambulatory Visit | Attending: Radiation Oncology | Admitting: Radiation Oncology

## 2019-04-26 ENCOUNTER — Other Ambulatory Visit: Payer: Self-pay

## 2019-04-26 DIAGNOSIS — Z510 Encounter for antineoplastic radiation therapy: Secondary | ICD-10-CM | POA: Diagnosis not present

## 2019-04-26 DIAGNOSIS — C50412 Malignant neoplasm of upper-outer quadrant of left female breast: Secondary | ICD-10-CM | POA: Diagnosis not present

## 2019-04-29 ENCOUNTER — Ambulatory Visit
Admission: RE | Admit: 2019-04-29 | Discharge: 2019-04-29 | Disposition: A | Discharge status: Home / Self Care | Payer: Medicare PPO | Source: Ambulatory Visit | Attending: Radiation Oncology | Admitting: Radiation Oncology

## 2019-04-29 ENCOUNTER — Other Ambulatory Visit: Payer: Self-pay

## 2019-04-29 DIAGNOSIS — Z510 Encounter for antineoplastic radiation therapy: Secondary | ICD-10-CM | POA: Diagnosis not present

## 2019-04-29 DIAGNOSIS — C50412 Malignant neoplasm of upper-outer quadrant of left female breast: Secondary | ICD-10-CM | POA: Diagnosis not present

## 2019-04-29 NOTE — Progress Notes (Signed)
Patient Care Team: Iona Beard, MD as PCP - General (Family Medicine) Mauro Kaufmann, RN as Oncology Nurse Navigator Rockwell Germany, RN as Oncology Nurse Navigator Erroll Luna, MD as Consulting Physician (General Surgery) Nicholas Lose, MD as Consulting Physician (Hematology and Oncology) Gery Pray, MD as Consulting Physician (Radiation Oncology)  DIAGNOSIS:    ICD-10-CM   1. Malignant neoplasm of upper-outer quadrant of left breast in female, estrogen receptor positive ([place])  C50.412    Z17.0     SUMMARY OF ONCOLOGIC HISTORY: Oncology History  Malignant neoplasm of upper-outer quadrant of left breast in female, estrogen receptor positive ([place])  01/22/2019 Initial Diagnosis   Patient palpated a left breast mass. Mammogram showed a 0.9cm mass in the left breast at the 3 o'clock position, no left axillary adenopathy. 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    02/07/2019 Genetic Testing   Negative genetic testing: no pathogenic variants identified on the [facility] Breast Cancer STAT panel + Common Hereditary Cancers panel. 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 The report date is 02/07/2019.  The STAT Breast cancer panel offered by [facility] includes sequencing and rearrangement analysis for the following 9 genes:  ATM, BRCA1, BRCA2, CDH1, CHEK2, PALB2, PTEN, STK11 and TP53.  The Common Hereditary Cancers Panel offered by [facility] includes sequencing and/or deletion duplication testing of the following 47 genes: APC, ATM, AXIN2, BARD1, BMPR1A, BRCA1, BRCA2, BRIP1, CDH1, CDK4, CDKN2A (p14ARF), CDKN2A (p16INK4a), CHEK2, CTNNA1, DICER1, EPCAM (Deletion/duplication testing only), GREM1 (promoter region deletion/duplication testing only), KIT, MEN1, MLH1, MSH2, MSH3, MSH6, MUTYH, NBN, NF1, NHTL1, PALB2, PDGFRA, PMS2, POLD1, POLE, PTEN, RAD50, RAD51C, RAD51D, SDHB, SDHC, SDHD, SMAD4, SMARCA4. STK11, TP53, TSC1,  TSC2, and VHL.  The following genes were evaluated for sequence changes only: SDHA and HOXB13 c.251G>A variant only.   02/21/2019 Surgery   Left lumpectomy ([facility]): IDC, 0.9cm, grade 2, clear margins, 2 left axillary lymph nodes negative.   04/02/2019 - 04/30/2019 Radiation Therapy   Adjuvant radiation     CHIEF COMPLIANT: Follow-up to discuss antiestrogen therapy  INTERVAL HISTORY: Patricia Nunez is a 64 y.o. with above-mentioned history of left breast cancer who underwent a lumpectomy and is currently on radiation treatment. She presents to the clinic today to discuss antiestrogen therapy.  Other than mild radiation dermatitis she is doing very well.  Today is the last day of radiation for her.  ALLERGIES:  is allergic to hydromet [hydrocodone-homatropine].  MEDICATIONS:  Current Outpatient Medications  Medication Sig Dispense Refill  . alendronate (FOSAMAX) 70 MG tablet Take 70 mg by mouth once a week. Sundays    . amLODipine (NORVASC) 5 MG tablet Take 5 mg by mouth daily.    Marland Kitchen anastrozole (ARIMIDEX) 1 MG tablet Take 1 tablet (1 mg total) by mouth daily. 90 tablet 3  . aspirin 81 MG tablet Take 81 mg by mouth daily.      Marland Kitchen b complex vitamins tablet Take 1 tablet by mouth daily.    . Biotin 5 MG CAPS Take by mouth.    . calcium carbonate (OS-CAL) 600 MG TABS Take 600 mg by mouth daily with breakfast.     . cholecalciferol (VITAMIN D3) 25 MCG (1000 UT) tablet Take 1,000 Units by mouth daily.     . Multiple Vitamin (MULTIVITAMIN) capsule Take 1 capsule by mouth daily.      . vitamin E 100 UNIT capsule Take 100 Units by  mouth daily.      Marland Kitchen zinc gluconate 50 MG tablet Take 50 mg by mouth daily.     No current facility-administered medications for this visit.    PHYSICAL EXAMINATION: ECOG PERFORMANCE STATUS: 1 - Symptomatic but completely ambulatory  Vitals:   04/30/19 1149  BP: 127/73  Pulse: (!) 112  Resp: 18  Temp: 97.9 F (36.6 C)  SpO2: 95%   Filed Weights    04/30/19 1149  Weight: 111 lb 9.6 oz (50.6 kg)    LABORATORY DATA:  I have reviewed the data as listed CMP Latest Ref Rng & Units 02/15/2019 01/30/2019 03/05/2014  Glucose 70 - 99 mg/dL 112(H) 106(H) 109(H)  BUN 8 - 23 mg/dL '16 11 13  ' Creatinine 0.44 - 1.00 mg/dL 0.79 0.74 0.73  Sodium 135 - 145 mmol/L 139 140 141  Potassium 3.5 - 5.1 mmol/L 4.2 4.3 4.5  Chloride 98 - 111 mmol/L 105 103 102  CO2 22 - 32 mmol/L '27 28 28  ' Calcium 8.9 - 10.3 mg/dL 9.7 9.3 9.8  Total Protein 6.5 - 8.1 g/dL 6.7 7.2 -  Total Bilirubin 0.3 - 1.2 mg/dL 0.5 0.3 -  Alkaline Phos 38 - 126 U/L 47 55 -  AST 15 - 41 U/L 21 19 -  ALT 0 - 44 U/L 18 17 -    Lab Results  Component Value Date   WBC 5.2 02/15/2019   HGB 13.4 02/15/2019   HCT 41.6 02/15/2019   MCV 91.6 02/15/2019   PLT 239 02/15/2019   NEUTROABS 1.4 (L) 02/15/2019    ASSESSMENT & PLAN:  Malignant neoplasm of upper-outer quadrant of left breast in female, estrogen receptor positive ([place]) 01/22/2019:Patient palpated a left breast mass. Mammogram showed a 0.9cm mass in the left breast at the 3 o'clock position, no left axillary adenopathy. 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 T1BN0 stage Ia  Recommendations: 1. 02/21/2019: Left lumpectomy: Grade 2 IDC 0.9 cm with clear margins, 0/2 lymph nodes negative, ER 95%, PR 95%, Ki-67 15%, HER-2 negative, T1b N0 stage Ia 2. Oncotype DX: Recurrence score 3, distant recurrence at 9 years: 3% 3. Adjuvant radiation therapy 04/02/2019-04/30/2019 4. Adjuvant antiestrogen therapy with anastrozole 1 mg daily to start January 2021 5.Genetics consult ------------------------------------------------------------------------------------------------------------------------------------------------- Anastrozole counseling: We discussed the risks and benefits of anti-estrogen therapy with aromatase inhibitors. These include but not limited to insomnia, hot flashes, mood changes, vaginal  dryness, bone density loss, and weight gain. We strongly believe that the benefits far outweigh the risks. Patient understands these risks and consented to starting treatment. Planned treatment duration is 5 years.  Mild radiation dermatitis: I discussed with her that it would get better very quickly.  She is using ointments. Mild fatigue: Related to radiation.  Should also get better very quickly.  Return to clinic in 3 months for survivorship care plan visit     No orders of the defined types were placed in this encounter.  The patient has a good understanding of the overall plan. she agrees with it. she will call with any problems that may develop before the next visit here.  Total time spent: 30 mins including face to face time and time spent for planning, charting and coordination of care  Nicholas Lose, MD 04/30/2019  I, Cloyde Reams Dorshimer, am acting as scribe for Dr. Nicholas Lose.  I have reviewed the above documentation for accuracy and completeness, and I agree with the above.

## 2019-04-30 ENCOUNTER — Encounter: Payer: Self-pay | Admitting: Radiation Oncology

## 2019-04-30 ENCOUNTER — Other Ambulatory Visit: Payer: Self-pay

## 2019-04-30 ENCOUNTER — Telehealth: Payer: Self-pay | Admitting: Hematology and Oncology

## 2019-04-30 ENCOUNTER — Inpatient Hospital Stay: Payer: Medicare PPO | Attending: Hematology and Oncology | Admitting: Hematology and Oncology

## 2019-04-30 ENCOUNTER — Ambulatory Visit
Admission: RE | Admit: 2019-04-30 | Discharge: 2019-04-30 | Disposition: A | Discharge status: Home / Self Care | Payer: Medicare PPO | Source: Ambulatory Visit | Attending: Radiation Oncology | Admitting: Radiation Oncology

## 2019-04-30 ENCOUNTER — Encounter: Payer: Self-pay | Admitting: *Deleted

## 2019-04-30 DIAGNOSIS — C50412 Malignant neoplasm of upper-outer quadrant of left female breast: Secondary | ICD-10-CM | POA: Diagnosis not present

## 2019-04-30 DIAGNOSIS — R0902 Hypoxemia: Secondary | ICD-10-CM | POA: Diagnosis not present

## 2019-04-30 DIAGNOSIS — R000 Tachycardia, unspecified: Secondary | ICD-10-CM | POA: Diagnosis not present

## 2019-04-30 DIAGNOSIS — Z510 Encounter for antineoplastic radiation therapy: Secondary | ICD-10-CM | POA: Diagnosis not present

## 2019-04-30 DIAGNOSIS — R0682 Tachypnea, not elsewhere classified: Secondary | ICD-10-CM | POA: Insufficient documentation

## 2019-04-30 DIAGNOSIS — Z170 Estrogen receptor positive status [ER+]: Secondary | ICD-10-CM | POA: Insufficient documentation

## 2019-04-30 MED ORDER — ANASTROZOLE 1 MG PO TABS: 1.0000 mg | ORAL_TABLET | Freq: Every day | ORAL | 3 refills | Status: DC

## 2019-04-30 NOTE — Telephone Encounter (Signed)
Scheduled per 1/12 los. Called and left msg. Mailing printout

## 2019-04-30 NOTE — Assessment & Plan Note (Signed)
01/22/2019:Patient palpated a left breast mass. Mammogram showed a 0.9cm mass in the left breast at the 3 o'clock position, no left axillary adenopathy. 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 T1BN0 stage Ia  Recommendations: 1. 02/21/2019: Left lumpectomy: Grade 2 IDC 0.9 cm with clear margins, 0/2 lymph nodes negative, ER 95%, PR 95%, Ki-67 15%, HER-2 negative, T1b N0 stage Ia 2. Oncotype DX: Recurrence score 3, distant recurrence at 9 years: 3% 3. Adjuvant radiation therapy 04/02/2019-04/30/2019 4. Adjuvant antiestrogen therapy with anastrozole 1 mg daily to start January 2021 5.Genetics consult ------------------------------------------------------------------------------------------------------------------------------------------------- Anastrozole counseling: We discussed the risks and benefits of anti-estrogen therapy with aromatase inhibitors. These include but not limited to insomnia, hot flashes, mood changes, vaginal dryness, bone density loss, and weight gain. We strongly believe that the benefits far outweigh the risks. Patient understands these risks and consented to starting treatment. Planned treatment duration is 5 years.  Return to clinic in 3 months for survivorship care plan visit

## 2019-05-01 NOTE — Progress Notes (Incomplete)
  Patient Name: Patricia Nunez MRN: [id number] DOB: 1955/12/03 Referring Physician: Nicholas Lose (Profile Not Attached) Date of Service: 04/30/2019 [facility], [place]                                                        End Of Treatment Note  Diagnoses: C50.412-Malignant neoplasm of upper-outer quadrant of left female breast  Cancer Staging: ClinicalStageT1b LeftBreast UOQ,Invasive DuctalCarcinoma, ER+/ PR+/ Her2-, Grade2  Intent: Curative  Radiation Treatment Dates: 04/01/2019 through 04/30/2019 Site Technique Total Dose (Gy) Dose per Fx (Gy) Completed Fx Beam Energies  Breast, Left: Breast_Lt 3D 40.05/40.05 2.67 15/15 6X  Breast, Left: Breast_Lt_Bst 3D 10/10 2 5/5 6X   Narrative: The patient tolerated radiation therapy relatively well. She reported some mild fatigue but took 30-minute walks on a daily basis. She also reported some soreness/singling to the lateral aspect of the left breast and back of upper arm. She denied chest pain, swelling, poor appetite, or issues with range of motion. On 04/23/2019 examination, there was some mild hyperpigmentation changes to the left breast without signs of infection or skin breakdown.  Plan: The patient will follow-up with radiation oncology in one month.  ________________________________________________   Blair Promise, PhD, MD  This document serves as a record of services personally performed by Gery Pray, MD. It was created on his behalf by Clerance Lav, a trained medical scribe. The creation of this record is based on the scribe's personal observations and the provider's statements to them. This document has been checked and approved by the attending provider.

## 2019-05-06 DIAGNOSIS — R069 Unspecified abnormalities of breathing: Secondary | ICD-10-CM | POA: Diagnosis not present

## 2019-05-06 DIAGNOSIS — C50412 Malignant neoplasm of upper-outer quadrant of left female breast: Secondary | ICD-10-CM | POA: Diagnosis not present

## 2019-05-07 DIAGNOSIS — Z20828 Contact with and (suspected) exposure to other viral communicable diseases: Secondary | ICD-10-CM | POA: Diagnosis not present

## 2019-05-07 DIAGNOSIS — I10 Essential (primary) hypertension: Secondary | ICD-10-CM | POA: Diagnosis not present

## 2019-05-07 DIAGNOSIS — R0602 Shortness of breath: Secondary | ICD-10-CM | POA: Diagnosis not present

## 2019-05-07 DIAGNOSIS — R000 Tachycardia, unspecified: Secondary | ICD-10-CM | POA: Diagnosis not present

## 2019-05-13 ENCOUNTER — Emergency Department (HOSPITAL_COMMUNITY): Payer: Medicare PPO

## 2019-05-13 ENCOUNTER — Inpatient Hospital Stay (HOSPITAL_COMMUNITY)
Admission: EM | Admit: 2019-05-13 | Discharge: 2019-05-16 | DRG: 193 | Disposition: A | Discharge status: Home / Self Care | Payer: Medicare PPO | Source: Ambulatory Visit | Attending: Internal Medicine | Admitting: Internal Medicine

## 2019-05-13 ENCOUNTER — Other Ambulatory Visit: Payer: Self-pay

## 2019-05-13 ENCOUNTER — Ambulatory Visit (HOSPITAL_COMMUNITY)
Admission: RE | Admit: 2019-05-13 | Discharge: 2019-05-13 | Disposition: A | Discharge status: Home / Self Care | Payer: Medicare PPO | Source: Ambulatory Visit | Attending: Medical | Admitting: Medical

## 2019-05-13 ENCOUNTER — Other Ambulatory Visit: Payer: Self-pay | Admitting: *Deleted

## 2019-05-13 ENCOUNTER — Encounter: Payer: Self-pay | Admitting: Hematology and Oncology

## 2019-05-13 ENCOUNTER — Inpatient Hospital Stay (HOSPITAL_BASED_OUTPATIENT_CLINIC_OR_DEPARTMENT_OTHER): Payer: Medicare PPO | Admitting: Medical

## 2019-05-13 VITALS — BP 163/90 | HR 135 | Temp 98.1°F | Resp 28 | Wt 110.2 lb

## 2019-05-13 DIAGNOSIS — Z885 Allergy status to narcotic agent status: Secondary | ICD-10-CM | POA: Diagnosis not present

## 2019-05-13 DIAGNOSIS — Z9841 Cataract extraction status, right eye: Secondary | ICD-10-CM | POA: Diagnosis not present

## 2019-05-13 DIAGNOSIS — R000 Tachycardia, unspecified: Secondary | ICD-10-CM | POA: Diagnosis not present

## 2019-05-13 DIAGNOSIS — Z7983 Long term (current) use of bisphosphonates: Secondary | ICD-10-CM

## 2019-05-13 DIAGNOSIS — Z9842 Cataract extraction status, left eye: Secondary | ICD-10-CM | POA: Diagnosis not present

## 2019-05-13 DIAGNOSIS — J9601 Acute respiratory failure with hypoxia: Secondary | ICD-10-CM | POA: Diagnosis not present

## 2019-05-13 DIAGNOSIS — R0902 Hypoxemia: Secondary | ICD-10-CM | POA: Diagnosis not present

## 2019-05-13 DIAGNOSIS — Z961 Presence of intraocular lens: Secondary | ICD-10-CM | POA: Diagnosis present

## 2019-05-13 DIAGNOSIS — Z86010 Personal history of colonic polyps: Secondary | ICD-10-CM

## 2019-05-13 DIAGNOSIS — Z170 Estrogen receptor positive status [ER+]: Secondary | ICD-10-CM

## 2019-05-13 DIAGNOSIS — Z20822 Contact with and (suspected) exposure to covid-19: Secondary | ICD-10-CM | POA: Diagnosis not present

## 2019-05-13 DIAGNOSIS — Z90710 Acquired absence of both cervix and uterus: Secondary | ICD-10-CM

## 2019-05-13 DIAGNOSIS — Z806 Family history of leukemia: Secondary | ICD-10-CM | POA: Diagnosis not present

## 2019-05-13 DIAGNOSIS — I10 Essential (primary) hypertension: Secondary | ICD-10-CM | POA: Diagnosis present

## 2019-05-13 DIAGNOSIS — Z823 Family history of stroke: Secondary | ICD-10-CM

## 2019-05-13 DIAGNOSIS — Z79811 Long term (current) use of aromatase inhibitors: Secondary | ICD-10-CM | POA: Diagnosis not present

## 2019-05-13 DIAGNOSIS — Z23 Encounter for immunization: Secondary | ICD-10-CM

## 2019-05-13 DIAGNOSIS — C50412 Malignant neoplasm of upper-outer quadrant of left female breast: Secondary | ICD-10-CM

## 2019-05-13 DIAGNOSIS — Z79899 Other long term (current) drug therapy: Secondary | ICD-10-CM

## 2019-05-13 DIAGNOSIS — Z9221 Personal history of antineoplastic chemotherapy: Secondary | ICD-10-CM

## 2019-05-13 DIAGNOSIS — Z803 Family history of malignant neoplasm of breast: Secondary | ICD-10-CM

## 2019-05-13 DIAGNOSIS — R0602 Shortness of breath: Secondary | ICD-10-CM | POA: Diagnosis not present

## 2019-05-13 DIAGNOSIS — J159 Unspecified bacterial pneumonia: Secondary | ICD-10-CM | POA: Diagnosis not present

## 2019-05-13 DIAGNOSIS — R0682 Tachypnea, not elsewhere classified: Secondary | ICD-10-CM

## 2019-05-13 DIAGNOSIS — Z7982 Long term (current) use of aspirin: Secondary | ICD-10-CM

## 2019-05-13 DIAGNOSIS — J189 Pneumonia, unspecified organism: Secondary | ICD-10-CM | POA: Diagnosis not present

## 2019-05-13 LAB — CBC WITH DIFFERENTIAL/PLATELET
Abs Immature Granulocytes: 0.06 10*3/uL (ref 0.00–0.07)
Basophils Absolute: 0.2 10*3/uL — ABNORMAL HIGH (ref 0.0–0.1)
Basophils Relative: 1 %
Eosinophils Absolute: 11.6 10*3/uL — ABNORMAL HIGH (ref 0.0–0.5)
Eosinophils Relative: 58 %
HCT: 47.4 % — ABNORMAL HIGH (ref 36.0–46.0)
Hemoglobin: 15.5 g/dL — ABNORMAL HIGH (ref 12.0–15.0)
Immature Granulocytes: 0 %
Lymphocytes Relative: 9 %
Lymphs Abs: 1.8 10*3/uL (ref 0.7–4.0)
MCH: 30.2 pg (ref 26.0–34.0)
MCHC: 32.7 g/dL (ref 30.0–36.0)
MCV: 92.2 fL (ref 80.0–100.0)
Monocytes Absolute: 1 10*3/uL (ref 0.1–1.0)
Monocytes Relative: 5 %
Neutro Abs: 5.5 10*3/uL (ref 1.7–7.7)
Neutrophils Relative %: 27 %
Platelets: 394 10*3/uL (ref 150–400)
RBC: 5.14 MIL/uL — ABNORMAL HIGH (ref 3.87–5.11)
RDW: 13 % (ref 11.5–15.5)
WBC: 20 10*3/uL — ABNORMAL HIGH (ref 4.0–10.5)
nRBC: 0 % (ref 0.0–0.2)

## 2019-05-13 LAB — CREATININE, SERUM
Creatinine, Ser: 0.67 mg/dL (ref 0.44–1.00)
GFR calc Af Amer: >60 mL/min (ref >60)
GFR calc non Af Amer: >60 mL/min (ref >60)

## 2019-05-13 LAB — URINALYSIS, ROUTINE W REFLEX MICROSCOPIC
Bilirubin Urine: NEGATIVE
Glucose, UA: NEGATIVE mg/dL
Hgb urine dipstick: NEGATIVE
Ketones, ur: 20 mg/dL — AB
Leukocytes,Ua: NEGATIVE
Nitrite: NEGATIVE
Protein, ur: NEGATIVE mg/dL
Specific Gravity, Urine: 1.017 (ref 1.005–1.030)
pH: 5 (ref 5.0–8.0)

## 2019-05-13 LAB — COMPREHENSIVE METABOLIC PANEL
ALT: 18 U/L (ref 0–44)
AST: 22 U/L (ref 15–41)
Albumin: 4.1 g/dL (ref 3.5–5.0)
Alkaline Phosphatase: 96 U/L (ref 38–126)
Anion gap: 11 (ref 5–15)
BUN: 18 mg/dL (ref 8–23)
CO2: 26 mmol/L (ref 22–32)
Calcium: 9.8 mg/dL (ref 8.9–10.3)
Chloride: 102 mmol/L (ref 98–111)
Creatinine, Ser: 0.83 mg/dL (ref 0.44–1.00)
GFR calc Af Amer: >60 mL/min (ref >60)
GFR calc non Af Amer: >60 mL/min (ref >60)
Glucose, Bld: 136 mg/dL — ABNORMAL HIGH (ref 70–99)
Potassium: 3.9 mmol/L (ref 3.5–5.1)
Sodium: 139 mmol/L (ref 135–145)
Total Bilirubin: 0.5 mg/dL (ref 0.3–1.2)
Total Protein: 8.4 g/dL — ABNORMAL HIGH (ref 6.5–8.1)

## 2019-05-13 LAB — CBC
HCT: 42.8 % (ref 36.0–46.0)
Hemoglobin: 13.6 g/dL (ref 12.0–15.0)
MCH: 29.4 pg (ref 26.0–34.0)
MCHC: 31.8 g/dL (ref 30.0–36.0)
MCV: 92.4 fL (ref 80.0–100.0)
Platelets: 377 10*3/uL (ref 150–400)
RBC: 4.63 MIL/uL (ref 3.87–5.11)
RDW: 12.9 % (ref 11.5–15.5)
WBC: 14 10*3/uL — ABNORMAL HIGH (ref 4.0–10.5)
nRBC: 0 % (ref 0.0–0.2)

## 2019-05-13 LAB — EXPECTORATED SPUTUM ASSESSMENT W GRAM STAIN, RFLX TO RESP C

## 2019-05-13 LAB — PROTIME-INR
INR: 1 (ref 0.8–1.2)
Prothrombin Time: 13.3 seconds (ref 11.4–15.2)

## 2019-05-13 LAB — RESPIRATORY PANEL BY RT PCR (FLU A&B, COVID)
Influenza A by PCR: NEGATIVE
Influenza B by PCR: NEGATIVE
SARS Coronavirus 2 by RT PCR: NEGATIVE

## 2019-05-13 LAB — APTT: aPTT: 28 seconds (ref 24–36)

## 2019-05-13 LAB — LACTIC ACID, PLASMA: Lactic Acid, Venous: 1.4 mmol/L (ref 0.5–1.9)

## 2019-05-13 LAB — STREP PNEUMONIAE URINARY ANTIGEN: Strep Pneumo Urinary Antigen: NEGATIVE

## 2019-05-13 MED ORDER — SODIUM CHLORIDE 0.9 % IV SOLN: INTRAVENOUS | Status: DC

## 2019-05-13 MED ORDER — SODIUM CHLORIDE 0.9 % IV SOLN
2.0000 g | INTRAVENOUS | Status: DC
  Filled 2019-05-13: qty 20

## 2019-05-13 MED ORDER — SODIUM CHLORIDE 0.9 % IV SOLN
500.0000 mg | INTRAVENOUS | Status: DC
  Administered 2019-05-13 – 2019-05-15 (×4): 500 mg via INTRAVENOUS
  Filled 2019-05-13 (×2): qty 500

## 2019-05-13 MED ORDER — SODIUM CHLORIDE (PF) 0.9 % IJ SOLN
INTRAMUSCULAR | Status: AC
  Filled 2019-05-13: qty 50

## 2019-05-13 MED ORDER — SODIUM CHLORIDE 0.9 % IV SOLN
2.0000 g | INTRAVENOUS | Status: DC
  Administered 2019-05-13 – 2019-05-15 (×3): 2 g via INTRAVENOUS
  Filled 2019-05-13 (×2): qty 2

## 2019-05-13 MED ORDER — IOHEXOL 350 MG/ML SOLN
100.0000 mL | Freq: Once | INTRAVENOUS | Status: AC | PRN
  Administered 2019-05-13: 100 mL via INTRAVENOUS

## 2019-05-13 MED ORDER — SODIUM CHLORIDE 0.9 % IV SOLN
500.0000 mg | INTRAVENOUS | Status: DC
  Filled 2019-05-13: qty 500

## 2019-05-13 MED ORDER — ACETAMINOPHEN 325 MG PO TABS
650.0000 mg | ORAL_TABLET | Freq: Four times a day (QID) | ORAL | Status: DC | PRN
  Administered 2019-05-14 (×2): 650 mg via ORAL
  Filled 2019-05-13 (×2): qty 2

## 2019-05-13 MED ORDER — SODIUM CHLORIDE 0.9 % IV SOLN
INTRAVENOUS | Status: DC
  Administered 2019-05-14: 10 mL/h via INTRAVENOUS

## 2019-05-13 MED ORDER — ENOXAPARIN SODIUM 40 MG/0.4ML ~~LOC~~ SOLN
40.0000 mg | SUBCUTANEOUS | Status: DC
  Administered 2019-05-13 – 2019-05-15 (×3): 40 mg via SUBCUTANEOUS
  Filled 2019-05-13 (×2): qty 0.4

## 2019-05-13 NOTE — ED Provider Notes (Signed)
[facility] DEPT Provider Note   CSN: [id number] Arrival date & time: 05/13/19  1605     History Chief Complaint  Patient presents with  . Shortness of Breath    Patricia Nunez is a 64 y.o. female.  64 year old female with history of breast cancer who presents with 1 week of increasing dyspnea on exertion.  No fever or cough.  No vomiting or diarrhea.  Denies any chest pain.  States that symptoms are slightly worse when she lies flat.  Patient saw her doctor today and was noted to have a pulse oximetry of 85% on room air.  Placed on 4 L nasal cannula.  Was sent here for further evaluation.        Past Medical History:  Diagnosis Date  . Cancer ([place])   . Colon polyps   . Family history of breast cancer   . Family history of leukemia   . Hypertension   . PONV (postoperative nausea and vomiting)   . Thyroid disease    h/o hyperactive, just being followed by endocrinologist    Patient Active Problem List   Diagnosis Date Noted  . Genetic testing 02/07/2019  . Family history of breast cancer   . Family history of leukemia   . Malignant neoplasm of upper-outer quadrant of left breast in female, estrogen receptor positive ([place]) 01/22/2019  . History of colonic polyps 11/25/2014    Past Surgical History:  Procedure Laterality Date  . ABDOMINAL HYSTERECTOMY  2000  . BREAST LUMPECTOMY WITH RADIOACTIVE SEED AND SENTINEL LYMPH NODE BIOPSY Left 02/21/2019   Procedure: LEFT BREAST LUMPECTOMY WITH RADIOACTIVE SEED;  Surgeon: Erroll Luna, MD;  Location: [facility];  Service: General;  Laterality: Left;  . CATARACT EXTRACTION W/PHACO Left 03/10/2014   Procedure: CATARACT EXTRACTION PHACO AND INTRAOCULAR LENS PLACEMENT LEFT EYE;  Surgeon: Tonny Branch, MD;  Location: [facility];  Service: Ophthalmology;  Laterality: Left;  CDE 3.50  . CATARACT EXTRACTION W/PHACO Right 04/14/2014   Procedure: CATARACT EXTRACTION PHACO AND INTRAOCULAR LENS PLACEMENT (IOC);   Surgeon: Tonny Branch, MD;  Location: [facility];  Service: Ophthalmology;  Laterality: Right;  CDE 3.35  . COLONOSCOPY  11/2008   Dr. Gala Romney: normal. Next colonoscopy in 2015 due to h/o polyps by Dr. Tamala Julian and path unknown  . COLONOSCOPY N/A 12/12/2014   Procedure: COLONOSCOPY;  Surgeon: Daneil Dolin, MD;  Location: [facility];  Service: Endoscopy;  Laterality: N/A;  1230  . GANGLION CYST EXCISION Left    x2-wrist- [place]  . SENTINEL NODE BIOPSY Left 02/21/2019   Procedure: Left Sentinel Lymph Node Mapping;  Surgeon: Erroll Luna, MD;  Location: [facility];  Service: General;  Laterality: Left;     OB History   No obstetric history on file.     Family History  Problem Relation Age of Onset  . Breast cancer Sister        diagnosed in her 28s  . Stroke Sister   . Breast cancer Maternal Grandmother        diagnosed in her 68s or 22s  . Breast cancer Sister        diagnosed in her 55s  . Stroke Mother   . Leukemia Maternal Aunt        diagnosed in her 60s  . Colon cancer Neg Hx     Social History   Tobacco Use  . Smoking status: Never Smoker  . Smokeless tobacco: Never Used  . Tobacco comment: Never smoked  Substance Use Topics  . Alcohol use: No    Alcohol/week: 0.0 standard drinks  . Drug use: No    Home Medications Prior to Admission medications   Medication Sig Start Date End Date Taking? Authorizing Provider  albuterol (VENTOLIN HFA) 108 (90 Base) MCG/ACT inhaler Inhale 1 puff into the lungs every 4 (four) hours as needed for wheezing or shortness of breath. 05/06/19  Yes [provider]  amLODipine (NORVASC) 5 MG tablet Take 5 mg by mouth daily. 01/12/19  Yes [provider]  amoxicillin (AMOXIL) 875 MG tablet Take 875 mg by mouth 2 (two) times daily. Will end 1.31.2021 05/09/19  Yes [provider]  anastrozole (ARIMIDEX) 1 MG tablet Take 1 tablet (1 mg total) by mouth daily. 04/30/19  Yes Nicholas Lose, MD  aspirin 81 MG tablet Take 81 mg by mouth  daily.     Yes [provider]  b complex vitamins tablet Take 1 tablet by mouth daily.   Yes [provider]  benzonatate (TESSALON) 100 MG capsule Take 100 mg by mouth 3 (three) times daily as needed for cough.   Yes [provider]  Biotin 5 MG CAPS Take by mouth.   Yes [provider]  calcium carbonate (OS-CAL) 600 MG TABS Take 600 mg by mouth daily with breakfast.    Yes [provider]  cholecalciferol (VITAMIN D3) 25 MCG (1000 UT) tablet Take 1,000 Units by mouth daily.    Yes [provider]  loratadine (CLARITIN) 10 MG tablet Take 10 mg by mouth daily as needed for allergies.   Yes [provider]  Multiple Vitamin (MULTIVITAMIN) capsule Take 1 capsule by mouth daily.     Yes [provider]  vitamin E 100 UNIT capsule Take 100 Units by mouth daily.     Yes [provider]  zinc gluconate 50 MG tablet Take 50 mg by mouth daily.   Yes [provider]  alendronate (FOSAMAX) 70 MG tablet Take 70 mg by mouth once a week. Sundays 01/12/19   [provider]    Allergies    Hydromet [hydrocodone-homatropine]  Review of Systems   Review of Systems  All other systems reviewed and are negative.   Physical Exam Updated Vital Signs BP (!) 146/90 (BP Location: Right Arm)   Pulse (!) 128   Temp 98.1 F (36.7 C) (Oral)   Resp 19   Ht 1.651 m (5\' 5" )   Wt 49.9 kg   SpO2 97%   BMI 18.30 kg/m   Physical Exam Vitals and nursing note reviewed.  Constitutional:      General: She is not in acute distress.    Appearance: Normal appearance. She is well-developed. She is not toxic-appearing.  HENT:     Head: Normocephalic and atraumatic.  Eyes:     General: Lids are normal.     Conjunctiva/sclera: Conjunctivae normal.     Pupils: Pupils are equal, round, and reactive to light.  Neck:     Thyroid: No thyroid mass.     Trachea: No tracheal deviation.  Cardiovascular:     Rate and Rhythm:  Regular rhythm. Tachycardia present.     Heart sounds: Normal heart sounds. No murmur. No gallop.   Pulmonary:     Effort: Pulmonary effort is normal. No respiratory distress.     Breath sounds: Normal breath sounds. No stridor. No decreased breath sounds, wheezing, rhonchi or rales.  Abdominal:     General: Bowel sounds are normal.  There is no distension.     Palpations: Abdomen is soft.     Tenderness: There is no abdominal tenderness. There is no rebound.  Musculoskeletal:        General: No tenderness. Normal range of motion.     Cervical back: Normal range of motion and neck supple.  Skin:    General: Skin is warm and dry.     Findings: No abrasion or rash.  Neurological:     Mental Status: She is alert and oriented to person, place, and time.     GCS: GCS eye subscore is 4. GCS verbal subscore is 5. GCS motor subscore is 6.     Cranial Nerves: No cranial nerve deficit.     Sensory: No sensory deficit.  Psychiatric:        Speech: Speech normal.        Behavior: Behavior normal.     ED Results / Procedures / Treatments   Labs (all labs ordered are listed, but only abnormal results are displayed) Labs Reviewed  CBC WITH DIFFERENTIAL/PLATELET - Abnormal; Notable for the following components:      Result Value   WBC 20.0 (*)    RBC 5.14 (*)    Hemoglobin 15.5 (*)    HCT 47.4 (*)    Eosinophils Absolute 11.6 (*)    Basophils Absolute 0.2 (*)    All other components within normal limits  CULTURE, BLOOD (ROUTINE X 2)  CULTURE, BLOOD (ROUTINE X 2)  URINE CULTURE  RESPIRATORY PANEL BY RT PCR (FLU A&B, COVID)  APTT  PROTIME-INR  LACTIC ACID, PLASMA  LACTIC ACID, PLASMA  COMPREHENSIVE METABOLIC PANEL  URINALYSIS, ROUTINE W REFLEX MICROSCOPIC    EKG EKG Interpretation  Date/Time:  Monday May 13 2019 16:06:38 EST Ventricular Rate:  128 PR Interval:    QRS Duration: 87 QT Interval:  318 QTC Calculation: 464 R Axis:   74 Text Interpretation: Sinus tachycardia  Atrial premature complex Minimal ST depression, inferior leads Confirmed by Lacretia Leigh ([id number]) on 05/13/2019 7:25:20 PM   Radiology DG Chest 2 View  Result Date: 05/13/2019 CLINICAL DATA:  Shortness of breath. EXAM: CHEST - 2 VIEW COMPARISON:  None. FINDINGS: There is no evidence of acute infiltrate, pleural effusion or pneumothorax. Radiopaque surgical clips are seen overlying the lateral aspect of the mid left lung. This represents a new finding when compared to the prior study. The heart size and mediastinal contours are within normal limits. The visualized skeletal structures are unremarkable. IMPRESSION: 1. No acute or active cardiopulmonary disease. Electronically Signed   By: Virgina Norfolk M.D.   On: 05/13/2019 15:27    Procedures Procedures (including critical care time)  Medications Ordered in ED Medications  0.9 %  sodium chloride infusion ( Intravenous New Bag/Given 05/13/19 1643)    ED Course  I have reviewed the triage vital signs and the nursing notes.  Pertinent labs & imaging results that were available during my care of the patient were reviewed by me and considered in my medical decision making (see chart for details).    MDM Rules/Calculators/A&P                      CT of the chest negative for PE but positive for pneumonia.  She does have an oxygen requirement as well as tachycardia.  Lactate normal.  Covid test negative.  Does have leukocytosis.  Blood culture sent.  Patient started on IV antibiotics.  Will admit to the hospital  service  CRITICAL CARE Performed by: Leota Jacobsen Total critical care time: 50 minutes Critical care time was exclusive of separately billable procedures and treating other patients. Critical care was necessary to treat or prevent imminent or life-threatening deterioration. Critical care was time spent personally by me on the following activities: development of treatment plan with patient and/or surrogate as well as nursing,  discussions with consultants, evaluation of patient's response to treatment, examination of patient, obtaining history from patient or surrogate, ordering and performing treatments and interventions, ordering and review of laboratory studies, ordering and review of radiographic studies, pulse oximetry and re-evaluation of patient's condition.  Final Clinical Impression(s) / ED Diagnoses Final diagnoses:  None    Rx / DC Orders ED Discharge Orders    None       Lacretia Leigh, MD 05/13/19 1927

## 2019-05-13 NOTE — ED Triage Notes (Signed)
Arrives from the cancer center, C/C SOB since Jan 18th, got a cest X-ray at the cancer center, noticed her oxygen was 85% RA, HR 135, O2 97% on 4 L [facility].

## 2019-05-13 NOTE — Progress Notes (Signed)
Received call from pt stating she has experienced shortness of breath with exertion x 1 week.  Pt states shortness of breath is so severe that she has to sleep sitting up.  Pt denies fever and has tested negative for Covid 19 with her PCP.  Pt states she finished radiation therapy 2 weeks ago and shortly after is when she became symptomatic.  Per MD pt to be seen in [facility] today.  Orders placed for STAT chest x-ray and high priority message sent to scheduling for be to be seen in [facility] at 3:30pm. Pt verbalized understanding of apt times.

## 2019-05-13 NOTE — Patient Instructions (Signed)

## 2019-05-13 NOTE — H&P (Signed)
History and Physical    DESTENIE POERTNER [id number] DOB: 05/21/1955 DOA: 05/13/2019  PCP: Iona Beard, MD  Patient coming from: Home  Chief Complaint: Shortness of breath and cough  HPI: SAIDIE LAYER is a 64 y.o. female with medical history significant of breast cancer finished chemo last month, hypertension comes in shortness of breath for a week and nasal congestion with cough.  She has had an uneventful treatment course for her cancer and has not been on any antibiotics recently.  She was recently diagnosed with a sinus infection by her primary care physician and was Covid negative.  Patient progressively got worse comes in with 85% O2 sats on room air and short of breath.  Patient feeling better at 4 L of oxygen on.  She is been given Rocephin and azithromycin.  Patient referred for admission for community-acquired pneumonia.  Her Covid PCR was again negative today.  Denies any fevers at home.  No nausea vomit diarrhea.  No chest pain.  No lower extremity edema.  Review of Systems: As per HPI otherwise 10 point review of systems negative.   Past Medical History:  Diagnosis Date  . Cancer ([place])   . Colon polyps   . Family history of breast cancer   . Family history of leukemia   . Hypertension   . PONV (postoperative nausea and vomiting)   . Thyroid disease    h/o hyperactive, just being followed by endocrinologist    Past Surgical History:  Procedure Laterality Date  . ABDOMINAL HYSTERECTOMY  2000  . BREAST LUMPECTOMY WITH RADIOACTIVE SEED AND SENTINEL LYMPH NODE BIOPSY Left 02/21/2019   Procedure: LEFT BREAST LUMPECTOMY WITH RADIOACTIVE SEED;  Surgeon: Erroll Luna, MD;  Location: [place];  Service: General;  Laterality: Left;  . CATARACT EXTRACTION W/PHACO Left 03/10/2014   Procedure: CATARACT EXTRACTION PHACO AND INTRAOCULAR LENS PLACEMENT LEFT EYE;  Surgeon: Tonny Branch, MD;  [facility];  Service: Ophthalmology;  Laterality: Left;  CDE 3.50  . CATARACT EXTRACTION  W/PHACO Right 04/14/2014   Procedure: CATARACT EXTRACTION PHACO AND INTRAOCULAR LENS PLACEMENT (IOC);  Surgeon: Tonny Branch, MD;  [facility];  Service: Ophthalmology;  Laterality: Right;  CDE 3.35  . COLONOSCOPY  11/2008   Dr. Gala Romney: normal. Next colonoscopy in 2015 due to h/o polyps by Dr. Tamala Julian and path unknown  . COLONOSCOPY N/A 12/12/2014   Procedure: COLONOSCOPY;  Surgeon: Daneil Dolin, MD;  [facility];  Service: Endoscopy;  Laterality: N/A;  1230  . GANGLION CYST EXCISION Left    x2-wrist- [place]  . SENTINEL NODE BIOPSY Left 02/21/2019   Procedure: Left Sentinel Lymph Node Mapping;  Surgeon: Erroll Luna, MD;  Location: [place];  Service: General;  Laterality: Left;     reports that she has never smoked. She has never used smokeless tobacco. She reports that she does not drink alcohol or use drugs.  Allergies  Allergen Reactions  . Hydromet [Hydrocodone-Homatropine] Nausea And Vomiting    Family History  Problem Relation Age of Onset  . Breast cancer Sister        diagnosed in her 71s  . Stroke Sister   . Breast cancer Maternal Grandmother        diagnosed in her 53s or 104s  . Breast cancer Sister        diagnosed in her 40s  . Stroke Mother   . Leukemia Maternal Aunt        diagnosed in her 46s  . Colon  cancer Neg Hx     Prior to Admission medications   Medication Sig Start Date End Date Taking? Authorizing Provider  albuterol (VENTOLIN HFA) 108 (90 Base) MCG/ACT inhaler Inhale 1 puff into the lungs every 4 (four) hours as needed for wheezing or shortness of breath. 05/06/19  Yes [provider]  amLODipine (NORVASC) 5 MG tablet Take 5 mg by mouth daily. 01/12/19  Yes [provider]  amoxicillin (AMOXIL) 875 MG tablet Take 875 mg by mouth 2 (two) times daily. Will end 1.31.2021 05/09/19  Yes [provider]  anastrozole (ARIMIDEX) 1 MG tablet Take 1 tablet (1 mg total) by mouth daily. 04/30/19  Yes Nicholas Lose, MD  aspirin 81  MG tablet Take 81 mg by mouth daily.     Yes [provider]  b complex vitamins tablet Take 1 tablet by mouth daily.   Yes [provider]  benzonatate (TESSALON) 100 MG capsule Take 100 mg by mouth 3 (three) times daily as needed for cough.   Yes [provider]  Biotin 5 MG CAPS Take by mouth.   Yes [provider]  calcium carbonate (OS-CAL) 600 MG TABS Take 600 mg by mouth daily with breakfast.    Yes [provider]  cholecalciferol (VITAMIN D3) 25 MCG (1000 UT) tablet Take 1,000 Units by mouth daily.    Yes [provider]  loratadine (CLARITIN) 10 MG tablet Take 10 mg by mouth daily as needed for allergies.   Yes [provider]  Multiple Vitamin (MULTIVITAMIN) capsule Take 1 capsule by mouth daily.     Yes [provider]  vitamin E 100 UNIT capsule Take 100 Units by mouth daily.     Yes [provider]  zinc gluconate 50 MG tablet Take 50 mg by mouth daily.   Yes [provider]  alendronate (FOSAMAX) 70 MG tablet Take 70 mg by mouth once a week. Sundays 01/12/19   [provider]    Physical Exam: Vitals:   05/13/19 1800 05/13/19 1830 05/13/19 1900 05/13/19 1930  BP: (!) 142/77 (!) 153/84 (!) 141/85 (!) 155/90  Pulse: (!) 114 (!) 120 (!) 116 (!) 127  Resp: 19 (!) 26 (!) 22 (!) 23  Temp:      TempSrc:      SpO2: 93% 95% 98% 93%  Weight:      Height:          Constitutional: NAD, calm, comfortable Vitals:   05/13/19 1800 05/13/19 1830 05/13/19 1900 05/13/19 1930  BP: (!) 142/77 (!) 153/84 (!) 141/85 (!) 155/90  Pulse: (!) 114 (!) 120 (!) 116 (!) 127  Resp: 19 (!) 26 (!) 22 (!) 23  Temp:      TempSrc:      SpO2: 93% 95% 98% 93%  Weight:      Height:       Eyes: PERRL, lids and conjunctivae normal ENMT: Mucous membranes are moist. Posterior pharynx clear of any exudate or lesions.Normal dentition.  Neck: normal, supple, no masses, no thyromegaly Respiratory: clear to  auscultation bilaterally, no wheezing, no crackles. Normal respiratory effort. No accessory muscle use.  Cardiovascular: Regular rate and rhythm, no murmurs / rubs / gallops. No extremity edema. 2+ pedal pulses. No carotid bruits.  Abdomen: no tenderness, no masses palpated. No hepatosplenomegaly. Bowel sounds positive.  Musculoskeletal: no clubbing / cyanosis. No joint deformity upper and lower extremities. Good ROM, no contractures. Normal muscle tone.  Skin: no rashes, lesions, ulcers. No induration  Neurologic: CN 2-12 grossly intact. Sensation intact, DTR normal. Strength 5/5 in all 4.  Psychiatric: Normal judgment and insight. Alert and oriented x 3. Normal mood.    Labs on Admission: I have personally reviewed following labs and imaging studies  CBC: Recent Labs  Lab 05/13/19 1612  WBC 20.0*  NEUTROABS 5.5  HGB 15.5*  HCT 47.4*  MCV 92.2  PLT [id number]   Basic Metabolic Panel: Recent Labs  Lab 05/13/19 1612  NA 139  K 3.9  CL 102  CO2 26  GLUCOSE 136*  BUN 18  CREATININE 0.83  CALCIUM 9.8   GFR: Estimated Creatinine Clearance: 53.2 mL/min (by C-G formula based on SCr of 0.83 mg/dL). Liver Function Tests: Recent Labs  Lab 05/13/19 1612  AST 22  ALT 18  ALKPHOS 96  BILITOT 0.5  PROT 8.4*  ALBUMIN 4.1   No results for input(s): LIPASE, AMYLASE in the last 168 hours. No results for input(s): AMMONIA in the last 168 hours. Coagulation Profile: Recent Labs  Lab 05/13/19 1612  INR 1.0   Cardiac Enzymes: No results for input(s): CKTOTAL, CKMB, CKMBINDEX, TROPONINI in the last 168 hours. BNP (last 3 results) No results for input(s): PROBNP in the last 8760 hours. HbA1C: No results for input(s): HGBA1C in the last 72 hours. CBG: No results for input(s): GLUCAP in the last 168 hours. Lipid Profile: No results for input(s): CHOL, HDL, LDLCALC, TRIG, CHOLHDL, LDLDIRECT in the last 72 hours. Thyroid Function Tests: No results for input(s): TSH, T4TOTAL, FREET4,  T3FREE, THYROIDAB in the last 72 hours. Anemia Panel: No results for input(s): VITAMINB12, FOLATE, FERRITIN, TIBC, IRON, RETICCTPCT in the last 72 hours. Urine analysis:    Component Value Date/Time   COLORURINE YELLOW 05/13/2019 1610   APPEARANCEUR CLEAR 05/13/2019 1610   LABSPEC 1.017 05/13/2019 1610   PHURINE 5.0 05/13/2019 1610   GLUCOSEU NEGATIVE 05/13/2019 1610   HGBUR NEGATIVE 05/13/2019 1610   BILIRUBINUR NEGATIVE 05/13/2019 1610   KETONESUR 20 (A) 05/13/2019 1610   PROTEINUR NEGATIVE 05/13/2019 1610   NITRITE NEGATIVE 05/13/2019 1610   LEUKOCYTESUR NEGATIVE 05/13/2019 1610   Sepsis Labs: !!!!!!!!!!!!!!!!!!!!!!!!!!!!!!!!!!!!!!!!!!!! @LABRCNTIP (procalcitonin:4,lacticidven:4) ) Recent Results (from the past 240 hour(s))  Respiratory Panel by RT PCR (Flu A&B, Covid) - Nasopharyngeal Swab     Status: None   Collection Time: 05/13/19  4:46 PM   Specimen: Nasopharyngeal Swab  Result Value Ref Range Status   SARS Coronavirus 2 by RT PCR NEGATIVE NEGATIVE Final    Comment: (NOTE) SARS-CoV-2 target nucleic acids are NOT DETECTED. The SARS-CoV-2 RNA is generally detectable in upper respiratoy specimens during the acute phase of infection. The lowest concentration of SARS-CoV-2 viral copies this assay can detect is 131 copies/mL. A negative result does not preclude SARS-Cov-2 infection and should not be used as the sole basis for treatment or other patient management decisions. A negative result may occur with  improper specimen collection/handling, submission of specimen other than nasopharyngeal swab, presence of viral mutation(s) within the areas targeted by this assay, and inadequate number of viral copies (<131 copies/mL). A negative result must be combined with clinical observations, patient history, and epidemiological information. The expected result is Negative. Fact Sheet for Patients:  PinkCheek.be Fact Sheet for Healthcare Providers:   GravelBags.it This test is not yet ap proved or cleared by the [place] FDA and  has been authorized for detection and/or diagnosis of SARS-CoV-2 by FDA under an Emergency Use Authorization (EUA). This EUA will remain  in effect (meaning this  test can be used) for the duration of the COVID-19 declaration under Section 564(b)(1) of the Act, 21 U.S.C. section 360bbb-3(b)(1), unless the authorization is terminated or revoked sooner.    Influenza A by PCR NEGATIVE NEGATIVE Final   Influenza B by PCR NEGATIVE NEGATIVE Final    Comment: (NOTE) The Xpert Xpress SARS-CoV-2/FLU/RSV assay is intended as an aid in  the diagnosis of influenza from Nasopharyngeal swab specimens and  should not be used as a sole basis for treatment. Nasal washings and  aspirates are unacceptable for Xpert Xpress SARS-CoV-2/FLU/RSV  testing. Fact Sheet for Patients: PinkCheek.be Fact Sheet for Healthcare Providers: GravelBags.it This test is not yet approved or cleared by the [place] FDA and  has been authorized for detection and/or diagnosis of SARS-CoV-2 by  FDA under an Emergency Use Authorization (EUA). This EUA will remain  in effect (meaning this test can be used) for the duration of the  Covid-19 declaration under Section 564(b)(1) of the Act, 21  U.S.C. section 360bbb-3(b)(1), unless the authorization is  terminated or revoked. Performed at [facility], [place] [address], [place], [place] [address]      Radiological Exams on Admission: DG Chest 2 View  Result Date: 05/13/2019 CLINICAL DATA:  Shortness of breath. EXAM: CHEST - 2 VIEW COMPARISON:  None. FINDINGS: There is no evidence of acute infiltrate, pleural effusion or pneumothorax. Radiopaque surgical clips are seen overlying the lateral aspect of the mid left lung. This represents a new finding when compared to the prior study. The  heart size and mediastinal contours are within normal limits. The visualized skeletal structures are unremarkable. IMPRESSION: 1. No acute or active cardiopulmonary disease. Electronically Signed   By: Virgina Norfolk M.D.   On: 05/13/2019 15:27   CT Angio Chest PE W/Cm &/Or Wo Cm  Result Date: 05/13/2019 CLINICAL DATA:  Shortness of breath with exertion x1 week. EXAM: CT ANGIOGRAPHY CHEST WITH CONTRAST TECHNIQUE: Multidetector CT imaging of the chest was performed using the standard protocol during bolus administration of intravenous contrast. Multiplanar CT image reconstructions and MIPs were obtained to evaluate the vascular anatomy. CONTRAST:  174mL OMNIPAQUE IOHEXOL 350 MG/ML SOLN COMPARISON:  None. FINDINGS: Cardiovascular: There is mild calcification of the aortic arch. Satisfactory opacification of the pulmonary arteries to the segmental level. No evidence of pulmonary embolism. Normal heart size. No pericardial effusion. Mediastinum/Nodes: No enlarged mediastinal, hilar, or axillary lymph nodes. Thyroid gland, trachea, and esophagus demonstrate no significant findings. Lungs/Pleura: Mild bilateral predominantly anteromedial upper lobe infiltrates are seen, left greater than right. There is no evidence of a pleural effusion or pneumothorax. Upper Abdomen: No acute abnormality. Musculoskeletal: No chest wall abnormality. No acute or significant osseous findings. Review of the MIP images confirms the above findings. IMPRESSION: 1. Mild bilateral upper lobe infiltrates, left greater than right. Electronically Signed   By: Virgina Norfolk M.D.   On: 05/13/2019 18:42   Old chart reviewed Case discussed with EDP Chest x-ray reviewed no edema or infiltrate however CTA showed bilateral upper lobe infiltrates  Assessment/Plan 64 year old female with acute respiratory failure secondary to community-acquired pneumonia Principal Problem:   PNA (pneumonia)-bacterial versus viral pneumonia.  Influenza  negative.  Covid PCR again negative today.  Check procalcitonin level.  Placed on Rocephin and azithromycin.  Follow-up on blood cultures obtain sputum cultures.  Wean oxygen as tolerates.  Will check CRP and dimer levels.  Active Problems:   Acute respiratory failure with hypoxia (HCC)-as above wean oxygen as tolerates  Malignant neoplasm of upper-outer quadrant of left breast in female, estrogen receptor positive (HCC)-completed chemo in the last month    DVT prophylaxis: Lovenox Code Status: Full Family Communication: None Disposition Plan: Days Consults called: None Admission status: Admission   Aalina Brege A MD [facility]  If 7PM-7AM, please contact night-coverage [email or web address] Password [facility]  05/13/2019, 7:52 PM

## 2019-05-13 NOTE — Progress Notes (Signed)
Sympt[facility] Management Clinic Progress Note   Patricia Nunez [id number] 02/10/1956 64 y.o.  [name] is managed by Dr. Nicholas Lose  Actively treated with chemotherapy/immunotherapy/hormonal therapy: yes  Current therapy: Anastrozole  Next scheduled appointment with provider: 07/29/2019  Assessment: Plan:    Malignant neoplasm of upper-outer quadrant of left breast in female, estrogen receptor positive ([place])  Hypoxia  Shortness of breath  Tachycardia  Tachypnea   ER positive malignant neoplasm of the left breast: Patricia Nunez continues to be managed by Dr. Nicholas Lose and is status post radiation therapy which was completed on 04/30/2019.  She is currently on anastrozole and is scheduled to be seen in follow-up on 07/29/2019.  Hypoxia, shortness of breath, tachycardia, and tachypnea: Patricia Nunez had a chest x-ray completed today which returned showing no acute abnormal findings.  She was placed on 4 L of O2 via nasal cannula given her hypoxia.  She was transported to the emergency room for evaluation and management.  Please see After Visit Summary for patient specific instructions.  Future Appointments  Date Time Provider [place]  06/06/2019 10:45 AM Gery Pray, MD [facility] None  07/29/2019  9:30 AM Causey, Charlestine Massed, NP [facility] None    No orders of the defined types were placed in this encounter.      Subjective:   Patient ID:  Patricia Nunez is a 64 y.o. (DOB 1955-10-29) female.  Chief Complaint:  Chief Complaint  Patient presents with  . Shortness of Breath    HPI Patricia Nunez  Is a 64 y.o. female with a diagnosis of an ER positive malignant neoplasm of the left breast.  She is managed by Dr. Nicholas Lose and is status post radiation therapy which was completed on 04/30/2019.  She is currently on anastrozole and is scheduled to be seen in follow-up on 07/29/2019.  She contacted her office earlier today stating that she was  having shortness of breath since her last radiation treatment.  She stated that she was only able to walk for short distance due to her shortness of breath and recently was having to sleep while sitting up. She denies fevers, chills, or sweats.  She had a negative Covid test last week.  She was referred for a chest x-ray today which returned showing no acute abnormalities.  She does not use supplemental oxygen at home and denies any history of asthma or other pulmonary illnesses.  Medications: I have reviewed the patient's current medications.  Allergies:  Allergies  Allergen Reactions  . Hydromet [Hydrocodone-Homatropine] Nausea And Vomiting    Past Medical History:  Diagnosis Date  . Cancer ([place])   . Colon polyps   . Family history of breast cancer   . Family history of leukemia   . Hypertension   . PONV (postoperative nausea and vomiting)   . Thyroid disease    h/o hyperactive, just being followed by endocrinologist    Past Surgical History:  Procedure Laterality Date  . ABDOMINAL HYSTERECTOMY  2000  . BREAST LUMPECTOMY WITH RADIOACTIVE SEED AND SENTINEL LYMPH NODE BIOPSY Left 02/21/2019   Procedure: LEFT BREAST LUMPECTOMY WITH RADIOACTIVE SEED;  Surgeon: Erroll Luna, MD;  Location: [place];  Service: General;  Laterality: Left;  . CATARACT EXTRACTION W/PHACO Left 03/10/2014   Procedure: CATARACT EXTRACTION PHACO AND INTRAOCULAR LENS PLACEMENT LEFT EYE;  Surgeon: Tonny Branch, MD;  [facility];  Service: Ophthalmology;  Laterality: Left;  CDE 3.50  . CATARACT EXTRACTION W/PHACO Right 04/14/2014  Procedure: CATARACT EXTRACTION PHACO AND INTRAOCULAR LENS PLACEMENT (IOC);  Surgeon: Tonny Branch, MD;  [facility];  Service: Ophthalmology;  Laterality: Right;  CDE 3.35  . COLONOSCOPY  11/2008   Dr. Gala Romney: normal. Next colonoscopy in 2015 due to h/o polyps by Dr. Tamala Julian and path unknown  . COLONOSCOPY N/A 12/12/2014   Procedure: COLONOSCOPY;  Surgeon: Daneil Dolin, MD;   [facility];  Service: Endoscopy;  Laterality: N/A;  1230  . GANGLION CYST EXCISION Left    x2-wrist- Smith  . SENTINEL NODE BIOPSY Left 02/21/2019   Procedure: Left Sentinel Lymph Node Mapping;  Surgeon: Erroll Luna, MD;  Location: [place];  Service: General;  Laterality: Left;    Family History  Problem Relation Age of Onset  . Breast cancer Sister        diagnosed in her 65s  . Stroke Sister   . Breast cancer Maternal Grandmother        diagnosed in her 21s or 59s  . Breast cancer Sister        diagnosed in her 46s  . Stroke Mother   . Leukemia Maternal Aunt        diagnosed in her 37s  . Colon cancer Neg Hx     Social History   Socioeconomic History  . Marital status: Divorced    Spouse name: Not on file  . Number of children: 1  . Years of education: Not on file  . Highest education level: Not on file  Occupational History  . Occupation: school system  Tobacco Use  . Smoking status: Never Smoker  . Smokeless tobacco: Never Used  . Tobacco comment: Never smoked  Substance and Sexual Activity  . Alcohol use: No    Alcohol/week: 0.0 standard drinks  . Drug use: No  . Sexual activity: Yes    Birth control/protection: Surgical  Other Topics Concern  . Not on file  Social History Narrative  . Not on file   Social Determinants of Health   Financial Resource Strain:   . Difficulty of Paying Living Expenses: Not on file  Food Insecurity:   . Worried About Charity fundraiser in the Last Year: Not on file  . Ran Out of Food in the Last Year: Not on file  Transportation Needs:   . Lack of Transportation (Medical): Not on file  . Lack of Transportation (Non-Medical): Not on file  Physical Activity:   . Days of Exercise per Week: Not on file  . Minutes of Exercise per Session: Not on file  Stress:   . Feeling of Stress : Not on file  Social Connections:   . Frequency of Communication with Friends and Family: Not on file  . Frequency of Social  Gatherings with Friends and Family: Not on file  . Attends Religious Services: Not on file  . Active Member of Clubs or Organizations: Not on file  . Attends Archivist Meetings: Not on file  . Marital Status: Not on file  Intimate Partner Violence:   . Fear of Current or Ex-Partner: Not on file  . Emotionally Abused: Not on file  . Physically Abused: Not on file  . Sexually Abused: Not on file    Past Medical History, Surgical history, Social history, and Family history were reviewed and updated as appropriate.   Please see review of systems for further details on the patient's review from today.   Review of Systems:  Review of Systems  Constitutional:  Negative for chills, diaphoresis and fever.  HENT: Negative for trouble swallowing.   Respiratory: Positive for shortness of breath. Negative for cough, choking, chest tightness, wheezing and stridor.   Cardiovascular: Negative for chest pain and palpitations.  Psychiatric/Behavioral: Positive for sleep disturbance.    Objective:   Physical Exam:  BP (!) 163/90 (BP Location: Right Arm, Patient Position: Sitting)   Pulse (!) 135   Temp 98.1 F (36.7 C) (Oral)   Resp (!) 28   Wt 110 lb 3 oz (50 kg)   SpO2 94% Comment: on 4L [place]  BMI 18.34 kg/m  ECOG: 1  Physical Exam Constitutional:      General: She is not in acute distress.    Appearance: She is not diaphoretic.  HENT:     Head: Normocephalic and atraumatic.  Cardiovascular:     Rate and Rhythm: Regular rhythm. Tachycardia present.     Heart sounds: Normal heart sounds. No murmur. No friction rub.  Pulmonary:     Effort: Pulmonary effort is normal. Tachypnea present. No respiratory distress.     Breath sounds: No stridor. Examination of the right-upper field reveals wheezing. Examination of the left-upper field reveals wheezing. Examination of the right-middle field reveals wheezing. Examination of the left-middle field reveals wheezing. Examination of the  right-lower field reveals wheezing. Examination of the left-lower field reveals wheezing. Wheezing present. No rales.  Skin:    General: Skin is warm and dry.     Coloration: Skin is not pale.     Findings: No erythema.  Neurological:     Mental Status: She is alert.  Psychiatric:        Behavior: Behavior normal.        Thought Content: Thought content normal.        Judgment: Judgment normal.     Lab Review:     Component Value Date/Time   NA 139 05/13/2019 1612   K 3.9 05/13/2019 1612   CL 102 05/13/2019 1612   CO2 26 05/13/2019 1612   GLUCOSE 136 (H) 05/13/2019 1612   BUN 18 05/13/2019 1612   CREATININE 0.83 05/13/2019 1612   CREATININE 0.74 01/30/2019 1245   CALCIUM 9.8 05/13/2019 1612   PROT 8.4 (H) 05/13/2019 1612   ALBUMIN 4.1 05/13/2019 1612   AST 22 05/13/2019 1612   AST 19 01/30/2019 1245   ALT 18 05/13/2019 1612   ALT 17 01/30/2019 1245   ALKPHOS 96 05/13/2019 1612   BILITOT 0.5 05/13/2019 1612   BILITOT 0.3 01/30/2019 1245   GFRNONAA >60 05/13/2019 1612   GFRNONAA >60 01/30/2019 1245   GFRAA >60 05/13/2019 1612   GFRAA >60 01/30/2019 1245       Component Value Date/Time   WBC 20.0 (H) 05/13/2019 1612   RBC 5.14 (H) 05/13/2019 1612   HGB 15.5 (H) 05/13/2019 1612   HGB 13.0 01/30/2019 1245   HCT 47.4 (H) 05/13/2019 1612   PLT 394 05/13/2019 1612   PLT 255 01/30/2019 1245   MCV 92.2 05/13/2019 1612   MCH 30.2 05/13/2019 1612   MCHC 32.7 05/13/2019 1612   RDW 13.0 05/13/2019 1612   LYMPHSABS 1.8 05/13/2019 1612   MONOABS 1.0 05/13/2019 1612   EOSABS 11.6 (H) 05/13/2019 1612   BASOSABS 0.2 (H) 05/13/2019 1612   -------------------------------  Imaging from last 24 hours (if applicable):  Radiology interpretation: DG Chest 2 View  Result Date: 05/13/2019 CLINICAL DATA:  Shortness of breath. EXAM: CHEST - 2 VIEW COMPARISON:  None. FINDINGS: There is  no evidence of acute infiltrate, pleural effusion or pneumothorax. Radiopaque surgical clips are  seen overlying the lateral aspect of the mid left lung. This represents a new finding when compared to the prior study. The heart size and mediastinal contours are within normal limits. The visualized skeletal structures are unremarkable. IMPRESSION: 1. No acute or active cardiopulmonary disease. Electronically Signed   By: Virgina Norfolk M.D.   On: 05/13/2019 15:27

## 2019-05-13 NOTE — ED Notes (Signed)
Spoke with Vince at [facility] and he states that he will be able to add on the Legionella and Strep Pneumoniae Urinary antigen to the urinary specimen that was sent earlier in the day.

## 2019-05-13 NOTE — Progress Notes (Signed)
Pt transferred to [facility] with belongings via w/c.  PA Lucianne Lei transported, to give in person report to receiving RN/Charge RN upon arrival.  Pt placed on 4L [place] oxygen.

## 2019-05-14 ENCOUNTER — Encounter (HOSPITAL_COMMUNITY): Payer: Self-pay | Admitting: Family Medicine

## 2019-05-14 DIAGNOSIS — J9601 Acute respiratory failure with hypoxia: Secondary | ICD-10-CM

## 2019-05-14 DIAGNOSIS — Z170 Estrogen receptor positive status [ER+]: Secondary | ICD-10-CM

## 2019-05-14 DIAGNOSIS — C50412 Malignant neoplasm of upper-outer quadrant of left female breast: Secondary | ICD-10-CM

## 2019-05-14 LAB — CBC WITH DIFFERENTIAL/PLATELET
Abs Immature Granulocytes: 0.04 10*3/uL (ref 0.00–0.07)
Basophils Absolute: 0.2 10*3/uL — ABNORMAL HIGH (ref 0.0–0.1)
Basophils Relative: 1 %
Eosinophils Absolute: 8.7 10*3/uL — ABNORMAL HIGH (ref 0.0–0.5)
Eosinophils Relative: 57 %
HCT: 42.3 % (ref 36.0–46.0)
Hemoglobin: 13.7 g/dL (ref 12.0–15.0)
Immature Granulocytes: 0 %
Lymphocytes Relative: 9 %
Lymphs Abs: 1.5 10*3/uL (ref 0.7–4.0)
MCH: 29.8 pg (ref 26.0–34.0)
MCHC: 32.4 g/dL (ref 30.0–36.0)
MCV: 92 fL (ref 80.0–100.0)
Monocytes Absolute: 1 10*3/uL (ref 0.1–1.0)
Monocytes Relative: 6 %
Neutro Abs: 4.3 10*3/uL (ref 1.7–7.7)
Neutrophils Relative %: 27 %
Platelets: 337 10*3/uL (ref 150–400)
RBC: 4.6 MIL/uL (ref 3.87–5.11)
RDW: 13 % (ref 11.5–15.5)
WBC: 15.7 10*3/uL — ABNORMAL HIGH (ref 4.0–10.5)
nRBC: 0 % (ref 0.0–0.2)

## 2019-05-14 LAB — C-REACTIVE PROTEIN: CRP: 0.9 mg/dL (ref <1.0)

## 2019-05-14 LAB — URINE CULTURE: Culture: NO GROWTH

## 2019-05-14 LAB — D-DIMER, QUANTITATIVE: D-Dimer, Quant: 0.79 ug/mL-FEU — ABNORMAL HIGH (ref 0.00–0.50)

## 2019-05-14 LAB — BASIC METABOLIC PANEL
Anion gap: 8 (ref 5–15)
BUN: 11 mg/dL (ref 8–23)
CO2: 25 mmol/L (ref 22–32)
Calcium: 8.8 mg/dL — ABNORMAL LOW (ref 8.9–10.3)
Chloride: 105 mmol/L (ref 98–111)
Creatinine, Ser: 0.58 mg/dL (ref 0.44–1.00)
GFR calc Af Amer: >60 mL/min (ref >60)
GFR calc non Af Amer: >60 mL/min (ref >60)
Glucose, Bld: 113 mg/dL — ABNORMAL HIGH (ref 70–99)
Potassium: 3.8 mmol/L (ref 3.5–5.1)
Sodium: 138 mmol/L (ref 135–145)

## 2019-05-14 LAB — PATHOLOGIST SMEAR REVIEW

## 2019-05-14 MED ORDER — BENZONATATE 100 MG PO CAPS: 100.0000 mg | ORAL_CAPSULE | Freq: Three times a day (TID) | ORAL | Status: DC | PRN

## 2019-05-14 MED ORDER — AMLODIPINE BESYLATE 5 MG PO TABS
5.0000 mg | ORAL_TABLET | Freq: Every day | ORAL | Status: DC
  Administered 2019-05-14 – 2019-05-16 (×3): 5 mg via ORAL
  Filled 2019-05-14 (×3): qty 1

## 2019-05-14 MED ORDER — ANASTROZOLE 1 MG PO TABS
1.0000 mg | ORAL_TABLET | Freq: Every day | ORAL | Status: DC
  Administered 2019-05-14 – 2019-05-16 (×3): 1 mg via ORAL
  Filled 2019-05-14 (×3): qty 1

## 2019-05-14 MED ORDER — ASPIRIN EC 81 MG PO TBEC
81.0000 mg | DELAYED_RELEASE_TABLET | Freq: Every day | ORAL | Status: DC
  Administered 2019-05-14 – 2019-05-16 (×3): 81 mg via ORAL
  Filled 2019-05-14 (×3): qty 1

## 2019-05-14 MED ORDER — ALBUTEROL SULFATE (2.5 MG/3ML) 0.083% IN NEBU
2.5000 mg | INHALATION_SOLUTION | RESPIRATORY_TRACT | Status: DC | PRN
  Administered 2019-05-14: 2.5 mg via RESPIRATORY_TRACT
  Filled 2019-05-14: qty 3

## 2019-05-14 NOTE — Plan of Care (Signed)

## 2019-05-14 NOTE — Progress Notes (Signed)
PROGRESS NOTE    Patricia Nunez  [id number] DOB: 01/01/1956 DOA: 05/13/2019 PCP: Iona Beard, MD    Brief Narrative:  64 year old female with history of breast cancer status post chemotherapy, currently on anastrozole, hypertension presented to the emergency room with about 1 week of nasal congestion with cough.  She was treated with amoxicillin for last 5 days, continues to have cough and found to be more short of breath and hypoxic at oncology clinic and sent to ER on 4 L oxygen. In the emergency room, 85% on room air, feeling better on 4 L.  Chest x-ray normal.  CTA negative for PE, bilateral upper lobe infiltrates.  Cultures were drawn and is started for treatment.   Assessment & Plan:   Principal Problem:   PNA (pneumonia) Active Problems:   Malignant neoplasm of upper-outer quadrant of left breast in female, estrogen receptor positive (HCC)   Acute respiratory failure with hypoxia (HCC)  Bacterial pneumonia: In a patient recently treated with chemotherapy.  Her blood counts are adequate. Continue Rocephin azithromycin.  Blood cultures pending.  Sputum cultures pending.  Legionella negative.  Streptococcal pending. Chest physiotherapy, incentive spirometry, deep breathing exercises, sputum induction, mucolytic's and bronchodilators. Supplemental oxygen to keep saturations more than 90%. Start mobilizing.  Hypertension: Blood pressure is stable.  Resume amlodipine from home.  Breast cancer: Recently completed chemotherapy.  Patient on anastrozole that she will resume.   DVT prophylaxis: Lovenox subcu Code Status: Full code Family Communication: None.  Patient talking to family. Disposition Plan: patient is from home.. Anticipated DC to home, Barriers to discharge, still hypoxic and on IV antibiotics.   Consultants:   None  Procedures:   None  Antimicrobials:  Anti-infectives (From admission, onward)   Start     Dose/Rate Route Frequency Ordered Stop   05/13/19 2000  cefTRIAXone (ROCEPHIN) 2 g in sodium chloride 0.9 % 100 mL IVPB  Status:  Discontinued     2 g 200 mL/hr over 30 Minutes Intravenous Every 24 hours 05/13/19 1924 05/13/19 1953   05/13/19 2000  azithromycin (ZITHROMAX) 500 mg in sodium chloride 0.9 % 250 mL IVPB  Status:  Discontinued     500 mg 250 mL/hr over 60 Minutes Intravenous Every 24 hours 05/13/19 1924 05/13/19 1953   05/13/19 2000  cefTRIAXone (ROCEPHIN) 2 g in sodium chloride 0.9 % 100 mL IVPB     2 g 200 mL/hr over 30 Minutes Intravenous Every 24 hours 05/13/19 1950 05/18/19 1959   05/13/19 2000  azithromycin (ZITHROMAX) 500 mg in sodium chloride 0.9 % 250 mL IVPB     500 mg 250 mL/hr over 60 Minutes Intravenous Every 24 hours 05/13/19 1950 05/18/19 1959         Subjective: Patient was seen and examined.  No overnight events.  Breathing feels better at rest.  She has some headache and sinus congestion.  Afebrile.  On 2 L oxygen.  Objective: Vitals:   05/13/19 2135 05/13/19 2300 05/14/19 0422 05/14/19 1007  BP: (!) 152/93  137/75 127/62  Pulse: (!) 120 97 (!) 110 (!) 111  Resp: 20  16 20   Temp: 97.9 F (36.6 C)  98.1 F (36.7 C) 98 F (36.7 C)  TempSrc: Oral  Oral   SpO2: 99%  91% 97%  Weight:      Height:        Intake/Output Summary (Last 24 hours) at 05/14/2019 1031 Last data filed at 05/13/2019 2048 Gross per 24 hour  Intake 173.91 ml  Output -  Net 173.91 ml   Filed Weights   05/13/19 1608  Weight: 49.9 kg    Examination:  General exam: Appears calm and comfortable on 2 L oxygen. Respiratory system: Clear to auscultation. Respiratory effort normal.  No added sounds. Cardiovascular system: S1 & S2 heard, RRR. No JVD, murmurs, rubs, gallops or clicks. No pedal edema. Gastrointestinal system: Abdomen is nondistended, soft and nontender. No organomegaly or masses felt. Normal bowel sounds heard. Central nervous system: Alert and oriented. No focal neurological deficits. Extremities:  Symmetric 5 x 5 power. Skin: No rashes, lesions or ulcers Psychiatry: Judgement and insight appear normal. Mood & affect appropriate.     Data Reviewed: I have personally reviewed following labs and imaging studies  CBC: Recent Labs  Lab 05/13/19 1612 05/13/19 2015 05/14/19 0428  WBC 20.0* 14.0* 15.7*  NEUTROABS 5.5  --  4.3  HGB 15.5* 13.6 13.7  HCT 47.4* 42.8 42.3  MCV 92.2 92.4 92.0  PLT 394 377 [id number]   Basic Metabolic Panel: Recent Labs  Lab 05/13/19 1612 05/13/19 2015 05/14/19 0428  NA 139  --  138  K 3.9  --  3.8  CL 102  --  105  CO2 26  --  25  GLUCOSE 136*  --  113*  BUN 18  --  11  CREATININE 0.83 0.67 0.58  CALCIUM 9.8  --  8.8*   GFR: Estimated Creatinine Clearance: 55.2 mL/min (by C-G formula based on SCr of 0.58 mg/dL). Liver Function Tests: Recent Labs  Lab 05/13/19 1612  AST 22  ALT 18  ALKPHOS 96  BILITOT 0.5  PROT 8.4*  ALBUMIN 4.1   No results for input(s): LIPASE, AMYLASE in the last 168 hours. No results for input(s): AMMONIA in the last 168 hours. Coagulation Profile: Recent Labs  Lab 05/13/19 1612  INR 1.0   Cardiac Enzymes: No results for input(s): CKTOTAL, CKMB, CKMBINDEX, TROPONINI in the last 168 hours. BNP (last 3 results) No results for input(s): PROBNP in the last 8760 hours. HbA1C: No results for input(s): HGBA1C in the last 72 hours. CBG: No results for input(s): GLUCAP in the last 168 hours. Lipid Profile: No results for input(s): CHOL, HDL, LDLCALC, TRIG, CHOLHDL, LDLDIRECT in the last 72 hours. Thyroid Function Tests: No results for input(s): TSH, T4TOTAL, FREET4, T3FREE, THYROIDAB in the last 72 hours. Anemia Panel: No results for input(s): VITAMINB12, FOLATE, FERRITIN, TIBC, IRON, RETICCTPCT in the last 72 hours. Sepsis Labs: Recent Labs  Lab 05/13/19 1612  LATICACIDVEN 1.4    Recent Results (from the past 240 hour(s))  Respiratory Panel by RT PCR (Flu A&B, Covid) - Nasopharyngeal Swab     Status: None    Collection Time: 05/13/19  4:46 PM   Specimen: Nasopharyngeal Swab  Result Value Ref Range Status   SARS Coronavirus 2 by RT PCR NEGATIVE NEGATIVE Final    Comment: (NOTE) SARS-CoV-2 target nucleic acids are NOT DETECTED. The SARS-CoV-2 RNA is generally detectable in upper respiratoy specimens during the acute phase of infection. The lowest concentration of SARS-CoV-2 viral copies this assay can detect is 131 copies/mL. A negative result does not preclude SARS-Cov-2 infection and should not be used as the sole basis for treatment or other patient management decisions. A negative result may occur with  improper specimen collection/handling, submission of specimen other than nasopharyngeal swab, presence of viral mutation(s) within the areas targeted by this assay, and inadequate number of viral copies (<131 copies/mL). A negative result must be combined with clinical observations,  patient history, and epidemiological information. The expected result is Negative. Fact Sheet for Patients:  PinkCheek.be Fact Sheet for Healthcare Providers:  GravelBags.it This test is not yet ap proved or cleared by the [place] FDA and  has been authorized for detection and/or diagnosis of SARS-CoV-2 by FDA under an Emergency Use Authorization (EUA). This EUA will remain  in effect (meaning this test can be used) for the duration of the COVID-19 declaration under Section 564(b)(1) of the Act, 21 U.S.C. section 360bbb-3(b)(1), unless the authorization is terminated or revoked sooner.    Influenza A by PCR NEGATIVE NEGATIVE Final   Influenza B by PCR NEGATIVE NEGATIVE Final    Comment: (NOTE) The Xpert Xpress SARS-CoV-2/FLU/RSV assay is intended as an aid in  the diagnosis of influenza from Nasopharyngeal swab specimens and  should not be used as a sole basis for treatment. Nasal washings and  aspirates are unacceptable for Xpert Xpress  SARS-CoV-2/FLU/RSV  testing. Fact Sheet for Patients: PinkCheek.be Fact Sheet for Healthcare Providers: GravelBags.it This test is not yet approved or cleared by the [place] FDA and  has been authorized for detection and/or diagnosis of SARS-CoV-2 by  FDA under an Emergency Use Authorization (EUA). This EUA will remain  in effect (meaning this test can be used) for the duration of the  Covid-19 declaration under Section 564(b)(1) of the Act, 21  U.S.C. section 360bbb-3(b)(1), unless the authorization is  terminated or revoked. Performed at [facility], [place] [address], [place], [place] [address]   Culture, sputum-assessment     Status: None   Collection Time: 05/13/19  8:34 PM   Specimen: Sputum  Result Value Ref Range Status   Specimen Description SPU  Final   Special Requests NONE  Final   Sputum evaluation   Final    THIS SPECIMEN IS ACCEPTABLE FOR SPUTUM CULTURE Performed at [facility], [place] [address] [place], [place] [address]    Report Status 05/13/2019 FINAL  Final  Culture, respiratory     Status: None (Preliminary result)   Collection Time: 05/13/19  8:34 PM   Specimen: Sputum  Result Value Ref Range Status   Specimen Description   Final    SPU Performed at [place] [address], [place], [place] [address]    Special Requests   Final    NONE Reflexed from [id number] Performed at [facility], [place] [address], [place], [place] [address]    Gram Stain   Final    FEW WBC PRESENT, PREDOMINANTLY PMN FEW SQUAMOUS EPITHELIAL CELLS PRESENT FEW GRAM POSITIVE COCCI IN PAIRS RARE BUDDING YEAST SEEN FEW GRAM NEGATIVE COCCOBACILLI Performed at [facility], [place] [address] [place], [place] [address]    Culture PENDING  Incomplete   Report Status PENDING  Incomplete         Radiology Studies: DG Chest 2 View  Result  Date: 05/13/2019 CLINICAL DATA:  Shortness of breath. EXAM: CHEST - 2 VIEW COMPARISON:  None. FINDINGS: There is no evidence of acute infiltrate, pleural effusion or pneumothorax. Radiopaque surgical clips are seen overlying the lateral aspect of the mid left lung. This represents a new finding when compared to the prior study. The heart size and mediastinal contours are within normal limits. The visualized skeletal structures are unremarkable. IMPRESSION: 1. No acute or active cardiopulmonary disease. Electronically Signed   By: Virgina Norfolk M.D.   On: 05/13/2019 15:27   CT Angio Chest PE W/Cm &/  Or Wo Cm  Result Date: 05/13/2019 CLINICAL DATA:  Shortness of breath with exertion x1 week. EXAM: CT ANGIOGRAPHY CHEST WITH CONTRAST TECHNIQUE: Multidetector CT imaging of the chest was performed using the standard protocol during bolus administration of intravenous contrast. Multiplanar CT image reconstructions and MIPs were obtained to evaluate the vascular anatomy. CONTRAST:  171mL OMNIPAQUE IOHEXOL 350 MG/ML SOLN COMPARISON:  None. FINDINGS: Cardiovascular: There is mild calcification of the aortic arch. Satisfactory opacification of the pulmonary arteries to the segmental level. No evidence of pulmonary embolism. Normal heart size. No pericardial effusion. Mediastinum/Nodes: No enlarged mediastinal, hilar, or axillary lymph nodes. Thyroid gland, trachea, and esophagus demonstrate no significant findings. Lungs/Pleura: Mild bilateral predominantly anteromedial upper lobe infiltrates are seen, left greater than right. There is no evidence of a pleural effusion or pneumothorax. Upper Abdomen: No acute abnormality. Musculoskeletal: No chest wall abnormality. No acute or significant osseous findings. Review of the MIP images confirms the above findings. IMPRESSION: 1. Mild bilateral upper lobe infiltrates, left greater than right. Electronically Signed   By: Virgina Norfolk M.D.   On: 05/13/2019 18:42         Scheduled Meds: . enoxaparin (LOVENOX) injection  40 mg Subcutaneous Q24H   Continuous Infusions: . sodium chloride Stopped (05/13/19 2041)  . azithromycin 500 mg (05/13/19 2031)  . cefTRIAXone (ROCEPHIN)  IV Stopped (05/13/19 2048)     LOS: 1 day    Time spent: 25 minutes    Barb Merino, MD [facility] Pager [phone]

## 2019-05-14 NOTE — Progress Notes (Signed)
Pt HR elevated 110's-130;s to 140's with activity at times during the shift. MD paged and prompt follow-up from MD, continued to monitor pt during the shift. Pt in YELLOW MEWS, c/o of lightlessness, and SOB.  VSs noted in flow sheet. MD made aware, and came up to evaluate pt and follow up with RN. BP meds given as ordered.  Resp notified; Breathing treatments completed and coached on IS usage. Pt states relief. Pt HR decreased to 100's will cont to monitor. SRP, RN

## 2019-05-14 NOTE — Progress Notes (Signed)
HR increased to 100-110's, YELLOW MEWs, MD made aware, no new orders, will cont to monitor.

## 2019-05-15 LAB — COMPREHENSIVE METABOLIC PANEL
ALT: 19 U/L (ref 0–44)
AST: 17 U/L (ref 15–41)
Albumin: 3.6 g/dL (ref 3.5–5.0)
Alkaline Phosphatase: 81 U/L (ref 38–126)
Anion gap: 9 (ref 5–15)
BUN: 11 mg/dL (ref 8–23)
CO2: 25 mmol/L (ref 22–32)
Calcium: 8.7 mg/dL — ABNORMAL LOW (ref 8.9–10.3)
Chloride: 107 mmol/L (ref 98–111)
Creatinine, Ser: 0.62 mg/dL (ref 0.44–1.00)
GFR calc Af Amer: >60 mL/min (ref >60)
GFR calc non Af Amer: >60 mL/min (ref >60)
Glucose, Bld: 116 mg/dL — ABNORMAL HIGH (ref 70–99)
Potassium: 3.7 mmol/L (ref 3.5–5.1)
Sodium: 141 mmol/L (ref 135–145)
Total Bilirubin: 0.7 mg/dL (ref 0.3–1.2)
Total Protein: 7.2 g/dL (ref 6.5–8.1)

## 2019-05-15 LAB — CBC WITH DIFFERENTIAL/PLATELET
Abs Immature Granulocytes: 0.03 10*3/uL (ref 0.00–0.07)
Basophils Absolute: 0.2 10*3/uL — ABNORMAL HIGH (ref 0.0–0.1)
Basophils Relative: 1 %
Eosinophils Absolute: 10.5 10*3/uL — ABNORMAL HIGH (ref 0.0–0.5)
Eosinophils Relative: 62 %
HCT: 41.7 % (ref 36.0–46.0)
Hemoglobin: 13.7 g/dL (ref 12.0–15.0)
Immature Granulocytes: 0 %
Lymphocytes Relative: 11 %
Lymphs Abs: 1.8 10*3/uL (ref 0.7–4.0)
MCH: 30 pg (ref 26.0–34.0)
MCHC: 32.9 g/dL (ref 30.0–36.0)
MCV: 91.4 fL (ref 80.0–100.0)
Monocytes Absolute: 0.9 10*3/uL (ref 0.1–1.0)
Monocytes Relative: 5 %
Neutro Abs: 3.6 10*3/uL (ref 1.7–7.7)
Neutrophils Relative %: 21 %
Platelets: 322 10*3/uL (ref 150–400)
RBC: 4.56 MIL/uL (ref 3.87–5.11)
RDW: 12.9 % (ref 11.5–15.5)
WBC: 16.8 10*3/uL — ABNORMAL HIGH (ref 4.0–10.5)
nRBC: 0 % (ref 0.0–0.2)

## 2019-05-15 LAB — LEGIONELLA PNEUMOPHILA SEROGP 1 UR AG: L. pneumophila Serogp 1 Ur Ag: NEGATIVE

## 2019-05-15 LAB — PHOSPHORUS: Phosphorus: 2.1 mg/dL — ABNORMAL LOW (ref 2.5–4.6)

## 2019-05-15 LAB — MAGNESIUM: Magnesium: 2 mg/dL (ref 1.7–2.4)

## 2019-05-15 MED ORDER — POTASSIUM & SODIUM PHOSPHATES 280-160-250 MG PO PACK
1.0000 | PACK | Freq: Three times a day (TID) | ORAL | Status: DC
  Administered 2019-05-15 – 2019-05-16 (×4): 1 via ORAL
  Filled 2019-05-15 (×7): qty 1

## 2019-05-15 MED ORDER — PNEUMOCOCCAL VAC POLYVALENT 25 MCG/0.5ML IJ INJ
0.5000 mL | INJECTION | INTRAMUSCULAR | Status: AC
  Administered 2019-05-16: 0.5 mL via INTRAMUSCULAR
  Filled 2019-05-15: qty 0.5

## 2019-05-15 MED ORDER — ENSURE ENLIVE PO LIQD: 237.0000 mL | Freq: Two times a day (BID) | ORAL | Status: DC

## 2019-05-15 MED ORDER — ADULT MULTIVITAMIN W/MINERALS CH
1.0000 | ORAL_TABLET | Freq: Every day | ORAL | Status: DC
  Administered 2019-05-15 – 2019-05-16 (×2): 1 via ORAL
  Filled 2019-05-15 (×2): qty 1

## 2019-05-15 MED ORDER — ENSURE ENLIVE PO LIQD: 237.0000 mL | ORAL | Status: DC

## 2019-05-15 NOTE — Progress Notes (Signed)
Initial Nutrition Assessment  RD working remotely.   DOCUMENTATION CODES:   Underweight  INTERVENTION:  - will order Ensure Enlive once/day, each supplement provides 350 kcal and 20 grams of protein. - will order Magic Cup with dinner meals, each supplement provides 290 kcal and 9 grams of protein. - will order daily multivitamin with minerals.    NUTRITION DIAGNOSIS:   Increased nutrient needs related to acute illness as evidenced by estimated needs.  GOAL:   Patient will meet greater than or equal to 90% of their needs  MONITOR:   PO intake, Supplement acceptance, Labs, Weight trends  REASON FOR ASSESSMENT:   Other (Comment)(underweight BMI)  ASSESSMENT:   64 year old female with history of breast cancer s/p chemotherapy currently on anastrozole, HTN. She presented to the ED on 1/25 with ~1 week of nasal congestion and cough. She was treated with amoxicillin for 5 days, but continued to have cough and found to be more short of breath and hypoxic at oncology clinic so she was sent to the ED on 4L [place]. In the ED, she was 85% on room air, CXR was normal, CT abdomen was negative for PE, bilateral upper lobe infiltrates.  Per flow sheet documentation, patient consumed 100% of breakfast and 50% of lunch yesterday. Her weight on 1/25 was 110 lb and weight on 04/30/19 was 111 lb. Weight on 03/13/19 was 113 lb. This indicates 3 lb weight loss (2.6% body weight) in the past 2 months; not significant for time frame.   Per notes: - bilateral PNA on IV abx - hx of breast cancer; recently completed chemo; on anastrozole    Labs reviewed; Ca: 8.7 mg/dl, Phos: 2.1 mg/dl. Medications reviewed.     NUTRITION - FOCUSED PHYSICAL EXAM:  unable to complete at this time.   Diet Order:   Diet Order            Diet Heart Room service appropriate? Yes; Fluid consistency: Thin  Diet effective now              EDUCATION NEEDS:   No education needs have been identified at this  time  Skin:  Skin Assessment: Reviewed RN Assessment  Last BM:  PTA/unknown  Height:   Ht Readings from Last 1 Encounters:  05/13/19 5\' 5"  (1.651 m)    Weight:   Wt Readings from Last 1 Encounters:  05/13/19 49.9 kg    Ideal Body Weight:  56.8 kg  BMI:  Body mass index is 18.3 kg/m.  Estimated Nutritional Needs:   Kcal:  1500-1700 kcal  Protein:  80-90 grams  Fluid:  >/= 1.8 L/day     Jarome Matin, MS, RD, LDN, [facility] Inpatient Clinical Dietitian Pager # [phone] After hours/weekend pager # [phone]

## 2019-05-15 NOTE — Plan of Care (Signed)

## 2019-05-15 NOTE — Progress Notes (Signed)
PROGRESS NOTE    Patricia Nunez  [id number] DOB: [birth year] DOA: 05/13/2019 PCP: Iona Beard, MD    Brief Narrative:  64 year old female with history of breast cancer status post chemotherapy, currently on anastrozole, hypertension presented to the emergency room with about 1 week of nasal congestion with cough.  She was treated with amoxicillin for last 5 days, continues to have cough and found to be more short of breath and hypoxic at oncology clinic and sent to ER on 4 L oxygen. In the emergency room, 85% on room air, feeling better on 4 L.  Chest x-ray normal.  CTA negative for PE, bilateral upper lobe infiltrates.  Cultures were drawn and started for treatment.   Assessment & Plan:   Principal Problem:   PNA (pneumonia) Active Problems:   Malignant neoplasm of upper-outer quadrant of left breast in female, estrogen receptor positive (HCC)   Acute respiratory failure with hypoxia (HCC)  Bacterial pneumonia: In a patient recently treated with chemotherapy.  Her blood counts are adequate. Continue Rocephin, azithromycin.  Blood cultures and sputum cultures negative so far.Legionella and streptococcal antigen negative. Chest physiotherapy, incentive spirometry, deep breathing exercises, sputum induction, mucolytic's and bronchodilators. Supplemental oxygen to keep saturations more than 90%. Start mobilizing. Patient has sinus tachycardia which is anticipated from hypoxia and active infection.  Hypertension: Blood pressure is stable.  Resumed amlodipine from home.  Breast cancer: Recently completed chemotherapy.  Patient on anastrozole that she will resume.   DVT prophylaxis: Lovenox subcu Code Status: Full code Family Communication: None.  Patient talking to family. Disposition Plan: patient is from home.. Anticipated DC to home, Barriers to discharge, still hypoxic and on IV antibiotics. Anticipate home tomorrow if adequate improvement.   Consultants:    None  Procedures:   None  Antimicrobials:  Anti-infectives (From admission, onward)   Start     Dose/Rate Route Frequency Ordered Stop   05/13/19 2000  cefTRIAXone (ROCEPHIN) 2 g in sodium chloride 0.9 % 100 mL IVPB  Status:  Discontinued     2 g 200 mL/hr over 30 Minutes Intravenous Every 24 hours 05/13/19 1924 05/13/19 1953   05/13/19 2000  azithromycin (ZITHROMAX) 500 mg in sodium chloride 0.9 % 250 mL IVPB  Status:  Discontinued     500 mg 250 mL/hr over 60 Minutes Intravenous Every 24 hours 05/13/19 1924 05/13/19 1953   05/13/19 2000  cefTRIAXone (ROCEPHIN) 2 g in sodium chloride 0.9 % 100 mL IVPB     2 g 200 mL/hr over 30 Minutes Intravenous Every 24 hours 05/13/19 1950 05/18/19 1959   05/13/19 2000  azithromycin (ZITHROMAX) 500 mg in sodium chloride 0.9 % 250 mL IVPB     500 mg 250 mL/hr over 60 Minutes Intravenous Every 24 hours 05/13/19 1950 05/18/19 1959         Subjective: Patient seen and examined.  Still has some nasal congestion and headache.  No sputum production.  Telemetry noted sinus tachycardia 140 on ambulation.  Afebrile.  She feels her breathing is better.  Objective: Vitals:   05/14/19 1505 05/14/19 2108 05/15/19 0515 05/15/19 1028  BP:  136/82 139/85   Pulse:  (!) 102 98   Resp:  19 18   Temp:  97.9 F (36.6 C) 97.9 F (36.6 C)   TempSrc:  Oral Oral   SpO2: 94% 95% 96% 95%  Weight:      Height:        Intake/Output Summary (Last 24 hours) at 05/15/2019 1113 Last data  filed at 05/15/2019 0500 Gross per 24 hour  Intake 370.21 ml  Output 1400 ml  Net -1029.79 ml   Filed Weights   05/13/19 1608  Weight: 49.9 kg    Examination:  General exam: Appears calm and comfortable on 2 L oxygen. Respiratory system: Clear to auscultation. Respiratory effort normal.  No added sounds. Cardiovascular system: S1 & S2 heard, RRR. No JVD, murmurs, rubs, gallops or clicks. No pedal edema. Gastrointestinal system: Abdomen is nondistended, soft and  nontender. No organomegaly or masses felt. Normal bowel sounds heard. Central nervous system: Alert and oriented. No focal neurological deficits. Extremities: Symmetric 5 x 5 power. Skin: No rashes, lesions or ulcers Psychiatry: Judgement and insight appear normal. Mood & affect appropriate.     Data Reviewed: I have personally reviewed following labs and imaging studies  CBC: Recent Labs  Lab 05/13/19 1612 05/13/19 2015 05/14/19 0428 05/15/19 0251  WBC 20.0* 14.0* 15.7* 16.8*  NEUTROABS 5.5  --  4.3 3.6  HGB 15.5* 13.6 13.7 13.7  HCT 47.4* 42.8 42.3 41.7  MCV 92.2 92.4 92.0 91.4  PLT 394 377 337 [id number]   Basic Metabolic Panel: Recent Labs  Lab 05/13/19 1612 05/13/19 2015 05/14/19 0428 05/15/19 0251  NA 139  --  138 141  K 3.9  --  3.8 3.7  CL 102  --  105 107  CO2 26  --  25 25  GLUCOSE 136*  --  113* 116*  BUN 18  --  11 11  CREATININE 0.83 0.67 0.58 0.62  CALCIUM 9.8  --  8.8* 8.7*  MG  --   --   --  2.0  PHOS  --   --   --  2.1*   GFR: Estimated Creatinine Clearance: 55.2 mL/min (by C-G formula based on SCr of 0.62 mg/dL). Liver Function Tests: Recent Labs  Lab 05/13/19 1612 05/15/19 0251  AST 22 17  ALT 18 19  ALKPHOS 96 81  BILITOT 0.5 0.7  PROT 8.4* 7.2  ALBUMIN 4.1 3.6   No results for input(s): LIPASE, AMYLASE in the last 168 hours. No results for input(s): AMMONIA in the last 168 hours. Coagulation Profile: Recent Labs  Lab 05/13/19 1612  INR 1.0   Cardiac Enzymes: No results for input(s): CKTOTAL, CKMB, CKMBINDEX, TROPONINI in the last 168 hours. BNP (last 3 results) No results for input(s): PROBNP in the last 8760 hours. HbA1C: No results for input(s): HGBA1C in the last 72 hours. CBG: No results for input(s): GLUCAP in the last 168 hours. Lipid Profile: No results for input(s): CHOL, HDL, LDLCALC, TRIG, CHOLHDL, LDLDIRECT in the last 72 hours. Thyroid Function Tests: No results for input(s): TSH, T4TOTAL, FREET4, T3FREE, THYROIDAB  in the last 72 hours. Anemia Panel: No results for input(s): VITAMINB12, FOLATE, FERRITIN, TIBC, IRON, RETICCTPCT in the last 72 hours. Sepsis Labs: Recent Labs  Lab 05/13/19 1612  LATICACIDVEN 1.4    Recent Results (from the past 240 hour(s))  Urine culture     Status: None   Collection Time: 05/13/19  4:10 PM   Specimen: In/Out Cath Urine  Result Value Ref Range Status   Specimen Description   Final    IN/OUT CATH URINE Performed at [facility], [place] [address] [place], [place] [address]    Special Requests   Final    NONE Performed at [facility], [place] [address], [place], [place] [address]    Culture   Final    NO GROWTH  Performed at [facility], [place] [address], [place], [place] [address]    Report Status 05/14/2019 FINAL  Final  Blood Culture (routine x 2)     Status: None (Preliminary result)   Collection Time: 05/13/19  4:12 PM   Specimen: BLOOD RIGHT FOREARM  Result Value Ref Range Status   Specimen Description   Final    BLOOD RIGHT FOREARM Performed at [facility] [address] [place], [place] [address]    Special Requests   Final    BOTTLES DRAWN AEROBIC AND ANAEROBIC Blood Culture adequate volume Performed at [place] [facility] [address] [place], [place] [address]    Culture   Final    NO GROWTH < 24 HOURS Performed at [facility] [address] [place], [place] [address]    Report Status PENDING  Incomplete  Respiratory Panel by RT PCR (Flu A&B, Covid) - Nasopharyngeal Swab     Status: None   Collection Time: 05/13/19  4:46 PM   Specimen: Nasopharyngeal Swab  Result Value Ref Range Status   SARS Coronavirus 2 by RT PCR NEGATIVE NEGATIVE Final    Comment: (NOTE) SARS-CoV-2 target nucleic acids are NOT DETECTED. The SARS-CoV-2 RNA is generally detectable in upper respiratoy specimens during the acute phase of infection. The lowest concentration of  SARS-CoV-2 viral copies this assay can detect is 131 copies/mL. A negative result does not preclude SARS-Cov-2 infection and should not be used as the sole basis for treatment or other patient management decisions. A negative result may occur with  improper specimen collection/handling, submission of specimen other than nasopharyngeal swab, presence of viral mutation(s) within the areas targeted by this assay, and inadequate number of viral copies (<131 copies/mL). A negative result must be combined with clinical observations, patient history, and epidemiological information. The expected result is Negative. Fact Sheet for Patients:  PinkCheek.be Fact Sheet for Healthcare Providers:  GravelBags.it This test is not yet ap proved or cleared by the [place] FDA and  has been authorized for detection and/or diagnosis of SARS-CoV-2 by FDA under an Emergency Use Authorization (EUA). This EUA will remain  in effect (meaning this test can be used) for the duration of the COVID-19 declaration under Section 564(b)(1) of the Act, 21 U.S.C. section 360bbb-3(b)(1), unless the authorization is terminated or revoked sooner.    Influenza A by PCR NEGATIVE NEGATIVE Final   Influenza B by PCR NEGATIVE NEGATIVE Final    Comment: (NOTE) The Xpert Xpress SARS-CoV-2/FLU/RSV assay is intended as an aid in  the diagnosis of influenza from Nasopharyngeal swab specimens and  should not be used as a sole basis for treatment. Nasal washings and  aspirates are unacceptable for Xpert Xpress SARS-CoV-2/FLU/RSV  testing. Fact Sheet for Patients: PinkCheek.be Fact Sheet for Healthcare Providers: GravelBags.it This test is not yet approved or cleared by the [place] FDA and  has been authorized for detection and/or diagnosis of SARS-CoV-2 by  FDA under an Emergency Use Authorization (EUA).  This EUA will remain  in effect (meaning this test can be used) for the duration of the  Covid-19 declaration under Section 564(b)(1) of the Act, 21  U.S.C. section 360bbb-3(b)(1), unless the authorization is  terminated or revoked. Performed at [facility], [place] [address], [place], [place] [address]   Culture, blood (routine x 2) Call MD if unable to obtain prior to antibiotics being given     Status: None (Preliminary result)  Collection Time: 05/13/19  8:06 PM   Specimen: BLOOD  Result Value Ref Range Status   Specimen Description   Final    BLOOD RIGHT ANTECUBITAL Performed at [facility] [address], [place], [place] [address]    Special Requests   Final    BOTTLES DRAWN AEROBIC AND ANAEROBIC Blood Culture results may not be optimal due to an excessive volume of blood received in culture bottles Performed at [facility] [address] [place], [place] [address]    Culture   Final    NO GROWTH < 12 HOURS Performed at [facility] [address] [place], [place] [address]    Report Status PENDING  Incomplete  Culture, blood (routine x 2) Call MD if unable to obtain prior to antibiotics being given     Status: None (Preliminary result)   Collection Time: 05/13/19  8:15 PM   Specimen: BLOOD  Result Value Ref Range Status   Specimen Description   Final    BLOOD RIGHT HAND Performed at [facility] [address] [place], [place] [address]    Special Requests   Final    BOTTLES DRAWN AEROBIC AND ANAEROBIC Blood Culture adequate volume Performed at [facility] [address] [place], [place] [address]    Culture   Final    NO GROWTH < 12 HOURS Performed at [facility] [address] [place], [place] [address]    Report Status PENDING  Incomplete  Culture, sputum-assessment     Status: None   Collection Time: 05/13/19  8:34 PM   Specimen: Sputum   Result Value Ref Range Status   Specimen Description SPU  Final   Special Requests NONE  Final   Sputum evaluation   Final    THIS SPECIMEN IS ACCEPTABLE FOR SPUTUM CULTURE Performed at [facility], [place] [address], [place], [place] [address]    Report Status 05/13/2019 FINAL  Final  Culture, respiratory     Status: None (Preliminary result)   Collection Time: 05/13/19  8:34 PM   Specimen: Sputum  Result Value Ref Range Status   Specimen Description   Final    SPU Performed at [facility] [address] [place], [place] [address]    Special Requests   Final    NONE Reflexed from [id number] Performed at [facility], [place] [address] [place], [place] [address]    Gram Stain   Final    FEW WBC PRESENT, PREDOMINANTLY PMN FEW SQUAMOUS EPITHELIAL CELLS PRESENT FEW GRAM POSITIVE COCCI IN PAIRS RARE BUDDING YEAST SEEN FEW GRAM NEGATIVE COCCOBACILLI    Culture   Final    CULTURE REINCUBATED FOR BETTER GROWTH Performed at [facility], [place] [address], [place], [place] [address]    Report Status PENDING  Incomplete         Radiology Studies: DG Chest 2 View  Result Date: 05/13/2019 CLINICAL DATA:  Shortness of breath. EXAM: CHEST - 2 VIEW COMPARISON:  None. FINDINGS: There is no evidence of acute infiltrate, pleural effusion or pneumothorax. Radiopaque surgical clips are seen overlying the lateral aspect of the mid left lung. This represents a new finding when compared to the prior study. The heart size and mediastinal contours are within normal limits. The visualized skeletal structures are unremarkable. IMPRESSION: 1. No acute or active cardiopulmonary disease. Electronically Signed   By: Joyce Gross.D.  On: 05/13/2019 15:27   CT Angio Chest PE W/Cm &/Or Wo Cm  Result Date: 05/13/2019 CLINICAL DATA:  Shortness of breath with exertion x1 week. EXAM: CT ANGIOGRAPHY CHEST WITH CONTRAST TECHNIQUE: Multidetector  CT imaging of the chest was performed using the standard protocol during bolus administration of intravenous contrast. Multiplanar CT image reconstructions and MIPs were obtained to evaluate the vascular anatomy. CONTRAST:  159mL OMNIPAQUE IOHEXOL 350 MG/ML SOLN COMPARISON:  None. FINDINGS: Cardiovascular: There is mild calcification of the aortic arch. Satisfactory opacification of the pulmonary arteries to the segmental level. No evidence of pulmonary embolism. Normal heart size. No pericardial effusion. Mediastinum/Nodes: No enlarged mediastinal, hilar, or axillary lymph nodes. Thyroid gland, trachea, and esophagus demonstrate no significant findings. Lungs/Pleura: Mild bilateral predominantly anteromedial upper lobe infiltrates are seen, left greater than right. There is no evidence of a pleural effusion or pneumothorax. Upper Abdomen: No acute abnormality. Musculoskeletal: No chest wall abnormality. No acute or significant osseous findings. Review of the MIP images confirms the above findings. IMPRESSION: 1. Mild bilateral upper lobe infiltrates, left greater than right. Electronically Signed   By: Virgina Norfolk M.D.   On: 05/13/2019 18:42        Scheduled Meds: . amLODipine  5 mg Oral Daily  . anastrozole  1 mg Oral Daily  . aspirin EC  81 mg Oral Daily  . enoxaparin (LOVENOX) injection  40 mg Subcutaneous Q24H  . [START ON 05/16/2019] feeding supplement (ENSURE ENLIVE)  237 mL Oral Q24H  . multivitamin with minerals  1 tablet Oral Daily  . [START ON 05/16/2019] pneumococcal 23 valent vaccine  0.5 mL Intramuscular Tomorrow-1000   Continuous Infusions: . sodium chloride 10 mL/hr (05/14/19 1040)  . azithromycin 500 mg (05/14/19 2228)  . cefTRIAXone (ROCEPHIN)  IV 2 g (05/14/19 2233)     LOS: 2 days    Time spent: 25 minutes    Barb Merino, MD [facility] Pager [phone]

## 2019-05-16 LAB — CULTURE, RESPIRATORY W GRAM STAIN: Culture: NORMAL

## 2019-05-16 LAB — CBC WITH DIFFERENTIAL/PLATELET
Abs Immature Granulocytes: 0.03 10*3/uL (ref 0.00–0.07)
Basophils Absolute: 0.2 10*3/uL — ABNORMAL HIGH (ref 0.0–0.1)
Basophils Relative: 1 %
Eosinophils Absolute: 9.9 10*3/uL — ABNORMAL HIGH (ref 0.0–0.5)
Eosinophils Relative: 63 %
HCT: 39.2 % (ref 36.0–46.0)
Hemoglobin: 12.8 g/dL (ref 12.0–15.0)
Immature Granulocytes: 0 %
Lymphocytes Relative: 12 %
Lymphs Abs: 1.9 10*3/uL (ref 0.7–4.0)
MCH: 30 pg (ref 26.0–34.0)
MCHC: 32.7 g/dL (ref 30.0–36.0)
MCV: 92 fL (ref 80.0–100.0)
Monocytes Absolute: 1 10*3/uL (ref 0.1–1.0)
Monocytes Relative: 6 %
Neutro Abs: 2.8 10*3/uL (ref 1.7–7.7)
Neutrophils Relative %: 18 %
Platelets: 299 10*3/uL (ref 150–400)
RBC: 4.26 MIL/uL (ref 3.87–5.11)
RDW: 13 % (ref 11.5–15.5)
WBC: 15.7 10*3/uL — ABNORMAL HIGH (ref 4.0–10.5)
nRBC: 0 % (ref 0.0–0.2)

## 2019-05-16 LAB — BASIC METABOLIC PANEL
Anion gap: 9 (ref 5–15)
BUN: 12 mg/dL (ref 8–23)
CO2: 25 mmol/L (ref 22–32)
Calcium: 8.6 mg/dL — ABNORMAL LOW (ref 8.9–10.3)
Chloride: 107 mmol/L (ref 98–111)
Creatinine, Ser: 0.63 mg/dL (ref 0.44–1.00)
GFR calc Af Amer: >60 mL/min (ref >60)
GFR calc non Af Amer: >60 mL/min (ref >60)
Glucose, Bld: 110 mg/dL — ABNORMAL HIGH (ref 70–99)
Potassium: 4.1 mmol/L (ref 3.5–5.1)
Sodium: 141 mmol/L (ref 135–145)

## 2019-05-16 MED ORDER — SODIUM CHLORIDE 0.9 % IV SOLN
2.0000 g | Freq: Once | INTRAVENOUS | Status: AC
  Administered 2019-05-16: 2 g via INTRAVENOUS
  Filled 2019-05-16: qty 2

## 2019-05-16 MED ORDER — AZITHROMYCIN 250 MG PO TABS
250.0000 mg | ORAL_TABLET | Freq: Once | ORAL | Status: AC
  Administered 2019-05-16: 250 mg via ORAL
  Filled 2019-05-16: qty 1

## 2019-05-16 NOTE — Discharge Summary (Signed)
Physician Discharge Summary  Patricia Nunez [id number] DOB: [birth year] DOA: 05/13/2019  PCP: Iona Beard, MD  Admit date: 05/13/2019 Discharge date: 05/16/2019  Admitted From: Home Disposition: Home  Recommendations for Outpatient Follow-up:  1. Follow up with PCP in 1-2 weeks Follow-up with oncology as scheduled. Home Health: Not applicable Equipment/Devices: Not applicable  Discharge Condition: Stable CODE STATUS: Full code Diet recommendation: Low-salt diet  Discharge summary: 64 year old female with history of breast cancer status post chemotherapy and currently on anastrozole, hypertension presented to emergency room with about 1 week of nasal congestion, cough headache and low-grade fever not improved with amoxicillin taken for 5 days.  In the emergency room, she was 85% on room air, improved with 4 L oxygen.  Chest x-ray was normal.  CTA of the chest was negative for PE, showed bilateral upper lobe infiltrates. Patient was admitted to the hospital treated for community-acquired pneumonia with failed outpatient therapy.  All cultures were negative.  Sputum culture showed normal flora.  Patient did very good clinical recovery.  She received total 3 days of IV antibiotics with ceftriaxone and azithromycin in the hospital.  Currently on room air ambulated with no hypoxia.  Minimal cough remains.  With good clinical recovery and negative cultures, patient is going home today.  Since her cultures were negative and had done adequate improvement, she will finish her remaining 4 days of amoxicillin.  Advised over-the-counter cough medications.  Discharge Diagnoses:  Principal Problem:   PNA (pneumonia) Active Problems:   Malignant neoplasm of upper-outer quadrant of left breast in female, estrogen receptor positive ([place])   Acute respiratory failure with hypoxia [facility])    Discharge Instructions  Discharge Instructions    Call MD for:  difficulty breathing, headache or visual  disturbances   Complete by: As directed    Call MD for:  temperature >100.4   Complete by: As directed    Diet - low sodium heart healthy   Complete by: As directed    Increase activity slowly   Complete by: As directed      Allergies as of 05/16/2019      Reactions   Hydromet [hydrocodone-homatropine] Nausea And Vomiting      Medication List    TAKE these medications   albuterol 108 (90 Base) MCG/ACT inhaler Commonly known as: VENTOLIN HFA Inhale 1 puff into the lungs every 4 (four) hours as needed for wheezing or shortness of breath.   alendronate 70 MG tablet Commonly known as: FOSAMAX Take 70 mg by mouth once a week. Sundays   amLODipine 5 MG tablet Commonly known as: NORVASC Take 5 mg by mouth daily.   amoxicillin 875 MG tablet Commonly known as: AMOXIL Take 875 mg by mouth 2 (two) times daily. Will end 1.31.2021   anastrozole 1 MG tablet Commonly known as: ARIMIDEX Take 1 tablet (1 mg total) by mouth daily.   aspirin 81 MG tablet Take 81 mg by mouth daily.   b complex vitamins tablet Take 1 tablet by mouth daily.   benzonatate 100 MG capsule Commonly known as: TESSALON Take 100 mg by mouth 3 (three) times daily as needed for cough.   Biotin 5 MG Caps Take by mouth.   calcium carbonate 600 MG Tabs tablet Commonly known as: OS-CAL Take 600 mg by mouth daily with breakfast.   cholecalciferol 25 MCG (1000 UNIT) tablet Commonly known as: VITAMIN D3 Take 1,000 Units by mouth daily.   loratadine 10 MG tablet Commonly known as: CLARITIN Take 10 mg  by mouth daily as needed for allergies.   multivitamin capsule Take 1 capsule by mouth daily.   vitamin E 45 MG (100 UNITS) capsule Take 100 Units by mouth daily.   zinc gluconate 50 MG tablet Take 50 mg by mouth daily.       Allergies  Allergen Reactions  . Hydromet [Hydrocodone-Homatropine] Nausea And Vomiting     Procedures/Studies: DG Chest 2 View  Result Date: 05/13/2019 CLINICAL DATA:   Shortness of breath. EXAM: CHEST - 2 VIEW COMPARISON:  None. FINDINGS: There is no evidence of acute infiltrate, pleural effusion or pneumothorax. Radiopaque surgical clips are seen overlying the lateral aspect of the mid left lung. This represents a new finding when compared to the prior study. The heart size and mediastinal contours are within normal limits. The visualized skeletal structures are unremarkable. IMPRESSION: 1. No acute or active cardiopulmonary disease. Electronically Signed   By: Virgina Norfolk M.D.   On: 05/13/2019 15:27   CT Angio Chest PE W/Cm &/Or Wo Cm  Result Date: 05/13/2019 CLINICAL DATA:  Shortness of breath with exertion x1 week. EXAM: CT ANGIOGRAPHY CHEST WITH CONTRAST TECHNIQUE: Multidetector CT imaging of the chest was performed using the standard protocol during bolus administration of intravenous contrast. Multiplanar CT image reconstructions and MIPs were obtained to evaluate the vascular anatomy. CONTRAST:  174mL OMNIPAQUE IOHEXOL 350 MG/ML SOLN COMPARISON:  None. FINDINGS: Cardiovascular: There is mild calcification of the aortic arch. Satisfactory opacification of the pulmonary arteries to the segmental level. No evidence of pulmonary embolism. Normal heart size. No pericardial effusion. Mediastinum/Nodes: No enlarged mediastinal, hilar, or axillary lymph nodes. Thyroid gland, trachea, and esophagus demonstrate no significant findings. Lungs/Pleura: Mild bilateral predominantly anteromedial upper lobe infiltrates are seen, left greater than right. There is no evidence of a pleural effusion or pneumothorax. Upper Abdomen: No acute abnormality. Musculoskeletal: No chest wall abnormality. No acute or significant osseous findings. Review of the MIP images confirms the above findings. IMPRESSION: 1. Mild bilateral upper lobe infiltrates, left greater than right. Electronically Signed   By: Virgina Norfolk M.D.   On: 05/13/2019 18:42    Subjective: Patient was seen and  examined.  No overnight events.  Ambulated in the hallway with no hypoxia or shortness of breath.   Discharge Exam: Vitals:   05/15/19 2041 05/16/19 0453  BP: (!) 145/81 137/75  Pulse: (!) 123 (!) 106  Resp: 20 20  Temp: 98 F (36.7 C) 98.5 F (36.9 C)  SpO2: 94% 96%   Vitals:   05/15/19 1028 05/15/19 1321 05/15/19 2041 05/16/19 0453  BP:  131/70 (!) 145/81 137/75  Pulse:  (!) 116 (!) 123 (!) 106  Resp:  18 20 20   Temp:  (!) 97.4 F (36.3 C) 98 F (36.7 C) 98.5 F (36.9 C)  TempSrc:  Oral Oral Oral  SpO2: 95% 95% 94% 96%  Weight:      Height:        General: Pt is alert, awake, not in acute distress Cardiovascular: RRR, S1/S2 +, no rubs, no gallops Respiratory: CTA bilaterally, no wheezing, no rhonchi Abdominal: Soft, NT, ND, bowel sounds + Extremities: no edema, no cyanosis    The results of significant diagnostics from this hospitalization (including imaging, microbiology, ancillary and laboratory) are listed below for reference.     Microbiology: Recent Results (from the past 240 hour(s))  Urine culture     Status: None   Collection Time: 05/13/19  4:10 PM   Specimen: In/Out Cath Urine  Result  Value Ref Range Status   Specimen Description   Final    IN/OUT CATH URINE Performed at [place] [address] [place], [place] [address]    Special Requests   Final    NONE Performed at [facility], [place] [address] [place], [place] [address]    Culture   Final    NO GROWTH Performed at [facility], [place] [address] [place], [place] [address]    Report Status 05/14/2019 FINAL  Final  Blood Culture (routine x 2)     Status: None (Preliminary result)   Collection Time: 05/13/19  4:12 PM   Specimen: BLOOD RIGHT FOREARM  Result Value Ref Range Status   Specimen Description   Final    BLOOD RIGHT FOREARM Performed at [place] [address] [place], [place] [address]    Special Requests    Final    BOTTLES DRAWN AEROBIC AND ANAEROBIC Blood Culture adequate volume Performed at [place] [address], [place], [place] [address]    Culture   Final    NO GROWTH 3 DAYS Performed at [facility], [place] [address], [place], [place] [address]    Report Status PENDING  Incomplete  Respiratory Panel by RT PCR (Flu A&B, Covid) - Nasopharyngeal Swab     Status: None   Collection Time: 05/13/19  4:46 PM   Specimen: Nasopharyngeal Swab  Result Value Ref Range Status   SARS Coronavirus 2 by RT PCR NEGATIVE NEGATIVE Final    Comment: (NOTE) SARS-CoV-2 target nucleic acids are NOT DETECTED. The SARS-CoV-2 RNA is generally detectable in upper respiratoy specimens during the acute phase of infection. The lowest concentration of SARS-CoV-2 viral copies this assay can detect is 131 copies/mL. A negative result does not preclude SARS-Cov-2 infection and should not be used as the sole basis for treatment or other patient management decisions. A negative result may occur with  improper specimen collection/handling, submission of specimen other than nasopharyngeal swab, presence of viral mutation(s) within the areas targeted by this assay, and inadequate number of viral copies (<131 copies/mL). A negative result must be combined with clinical observations, patient history, and epidemiological information. The expected result is Negative. Fact Sheet for Patients:  PinkCheek.be Fact Sheet for Healthcare Providers:  GravelBags.it This test is not yet ap proved or cleared by the [place] FDA and  has been authorized for detection and/or diagnosis of SARS-CoV-2 by FDA under an Emergency Use Authorization (EUA). This EUA will remain  in effect (meaning this test can be used) for the duration of the COVID-19 declaration under Section 564(b)(1) of the Act, 21 U.S.C. section 360bbb-3(b)(1), unless the  authorization is terminated or revoked sooner.    Influenza A by PCR NEGATIVE NEGATIVE Final   Influenza B by PCR NEGATIVE NEGATIVE Final    Comment: (NOTE) The Xpert Xpress SARS-CoV-2/FLU/RSV assay is intended as an aid in  the diagnosis of influenza from Nasopharyngeal swab specimens and  should not be used as a sole basis for treatment. Nasal washings and  aspirates are unacceptable for Xpert Xpress SARS-CoV-2/FLU/RSV  testing. Fact Sheet for Patients: PinkCheek.be Fact Sheet for Healthcare Providers: GravelBags.it This test is not yet approved or cleared by the [place] FDA and  has been authorized for detection and/or diagnosis of SARS-CoV-2 by  FDA under an Emergency Use Authorization (EUA). This EUA will remain  in effect (meaning this test can be used) for the  duration of the  Covid-19 declaration under Section 564(b)(1) of the Act, 21  U.S.C. section 360bbb-3(b)(1), unless the authorization is  terminated or revoked. Performed at [facility], [place] [address], [place], [place] [address]   Culture, blood (routine x 2) Call MD if unable to obtain prior to antibiotics being given     Status: None (Preliminary result)   Collection Time: 05/13/19  8:06 PM   Specimen: BLOOD  Result Value Ref Range Status   Specimen Description   Final    BLOOD RIGHT ANTECUBITAL Performed at [place] [address], [place], [place] [address]    Special Requests   Final    BOTTLES DRAWN AEROBIC AND ANAEROBIC Blood Culture results may not be optimal due to an excessive volume of blood received in culture bottles Performed at [place] [address], [place], [place] [address]    Culture   Final    NO GROWTH 3 DAYS Performed at [facility], [place] [address] [place], [place] [address]    Report Status PENDING  Incomplete  Culture, blood (routine x 2) Call MD if  unable to obtain prior to antibiotics being given     Status: None (Preliminary result)   Collection Time: 05/13/19  8:15 PM   Specimen: BLOOD  Result Value Ref Range Status   Specimen Description   Final    BLOOD RIGHT HAND Performed at [place] [address], [place], [place] [address]    Special Requests   Final    BOTTLES DRAWN AEROBIC AND ANAEROBIC Blood Culture adequate volume Performed at [place] [address] [place], [place] [address]    Culture   Final    NO GROWTH 3 DAYS Performed at [facility], [place] [address] [place], [place] [address]    Report Status PENDING  Incomplete  Culture, sputum-assessment     Status: None   Collection Time: 05/13/19  8:34 PM   Specimen: Sputum  Result Value Ref Range Status   Specimen Description SPU  Final   Special Requests NONE  Final   Sputum evaluation   Final    THIS SPECIMEN IS ACCEPTABLE FOR SPUTUM CULTURE Performed at [facility], [place] [address], [place], [place] [address]    Report Status 05/13/2019 FINAL  Final  Culture, respiratory     Status: None   Collection Time: 05/13/19  8:34 PM   Specimen: Sputum  Result Value Ref Range Status   Specimen Description   Final    SPU Performed at [place] [address] [place],  [address]    Special Requests   Final    NONE Reflexed from [id number] Performed at [facility], [place] [address] [place], [place] [address]    Gram Stain   Final    FEW WBC PRESENT, PREDOMINANTLY PMN FEW SQUAMOUS EPITHELIAL CELLS PRESENT FEW GRAM POSITIVE COCCI IN PAIRS RARE BUDDING YEAST SEEN FEW GRAM NEGATIVE COCCOBACILLI    Culture   Final    FEW Consistent with normal respiratory flora. Performed at [facility], [place] [address], [place],  [address]    Report Status 05/16/2019 FINAL  Final     Labs: BNP (last 3 results) No results for input(s): BNP in the  last 8760 hours. Basic Metabolic Panel: Recent Labs  Lab 05/13/19 1612 05/13/19 2015 05/14/19 0428 05/15/19 0251 05/16/19 0305  NA 139  --  138 141 141  K 3.9  --  3.8 3.7 4.1  CL 102  --  105 107 107  CO2 26  --  25 25 25   GLUCOSE 136*  --  113* 116* 110*  BUN 18  --  11 11 12   CREATININE 0.83 0.67 0.58 0.62 0.63  CALCIUM 9.8  --  8.8* 8.7* 8.6*  MG  --   --   --  2.0  --   PHOS  --   --   --  2.1*  --    Liver Function Tests: Recent Labs  Lab 05/13/19 1612 05/15/19 0251  AST 22 17  ALT 18 19  ALKPHOS 96 81  BILITOT 0.5 0.7  PROT 8.4* 7.2  ALBUMIN 4.1 3.6   No results for input(s): LIPASE, AMYLASE in the last 168 hours. No results for input(s): AMMONIA in the last 168 hours. CBC: Recent Labs  Lab 05/13/19 1612 05/13/19 2015 05/14/19 0428 05/15/19 0251 05/16/19 0305  WBC 20.0* 14.0* 15.7* 16.8* 15.7*  NEUTROABS 5.5  --  4.3 3.6 2.8  HGB 15.5* 13.6 13.7 13.7 12.8  HCT 47.4* 42.8 42.3 41.7 39.2  MCV 92.2 92.4 92.0 91.4 92.0  PLT 394 377 337 322 299   Cardiac Enzymes: No results for input(s): CKTOTAL, CKMB, CKMBINDEX, TROPONINI in the last 168 hours. BNP: Invalid input(s): POCBNP CBG: No results for input(s): GLUCAP in the last 168 hours. D-Dimer Recent Labs    05/14/19 0428  DDIMER 0.79*   Hgb A1c No results for input(s): HGBA1C in the last 72 hours. Lipid Profile No results for input(s): CHOL, HDL, LDLCALC, TRIG, CHOLHDL, LDLDIRECT in the last 72 hours. Thyroid function studies No results for input(s): TSH, T4TOTAL, T3FREE, THYROIDAB in the last 72 hours.  Invalid input(s): FREET3 Anemia work up No results for input(s): VITAMINB12, FOLATE, FERRITIN, TIBC, IRON, RETICCTPCT in the last 72 hours. Urinalysis    Component Value Date/Time   COLORURINE YELLOW 05/13/2019 [place] 05/13/2019 1610   LABSPEC 1.017 05/13/2019 1610   PHURINE 5.0 05/13/2019 1610   GLUCOSEU NEGATIVE 05/13/2019 1610   HGBUR NEGATIVE 05/13/2019 1610    [place] 05/13/2019 1610   KETONESUR 20 (A) 05/13/2019 1610   PROTEINUR NEGATIVE 05/13/2019 1610   NITRITE NEGATIVE 05/13/2019 1610   LEUKOCYTESUR NEGATIVE 05/13/2019 1610   Sepsis Labs Invalid input(s): PROCALCITONIN,  WBC,  LACTICIDVEN Microbiology Recent Results (from the past 240 hour(s))  Urine culture     Status: None   Collection Time: 05/13/19  4:10 PM   Specimen: In/Out Cath Urine  Result Value Ref Range Status   Specimen Description   Final    IN/OUT CATH URINE Performed at [facility], [place] [address], [place], [place] [address]    Special Requests   Final    NONE Performed at [facility], [place] [address], [place], [place] [address]    Culture   Final    NO GROWTH Performed at [facility], [place] [address] [place], [place] [address]    Report Status 05/14/2019 FINAL  Final  Blood Culture (routine x 2)     Status: None (Preliminary result)   Collection Time: 05/13/19  4:12 PM   Specimen: BLOOD RIGHT FOREARM  Result Value Ref Range Status   Specimen Description   Final    BLOOD RIGHT FOREARM Performed at [place] [address] [place], [place] [address]    Special Requests  Final    BOTTLES DRAWN AEROBIC AND ANAEROBIC Blood Culture adequate volume Performed at [place] [address], [place], [place] [address]    Culture   Final    NO GROWTH 3 DAYS Performed at [facility], [place] [address], [place], [place] [address]    Report Status PENDING  Incomplete  Respiratory Panel by RT PCR (Flu A&B, Covid) - Nasopharyngeal Swab     Status: None   Collection Time: 05/13/19  4:46 PM   Specimen: Nasopharyngeal Swab  Result Value Ref Range Status   SARS Coronavirus 2 by RT PCR NEGATIVE NEGATIVE Final    Comment: (NOTE) SARS-CoV-2 target nucleic acids are NOT DETECTED. The SARS-CoV-2 RNA is generally detectable in upper respiratoy specimens during the acute  phase of infection. The lowest concentration of SARS-CoV-2 viral copies this assay can detect is 131 copies/mL. A negative result does not preclude SARS-Cov-2 infection and should not be used as the sole basis for treatment or other patient management decisions. A negative result may occur with  improper specimen collection/handling, submission of specimen other than nasopharyngeal swab, presence of viral mutation(s) within the areas targeted by this assay, and inadequate number of viral copies (<131 copies/mL). A negative result must be combined with clinical observations, patient history, and epidemiological information. The expected result is Negative. Fact Sheet for Patients:  PinkCheek.be Fact Sheet for Healthcare Providers:  GravelBags.it This test is not yet ap proved or cleared by the [place] FDA and  has been authorized for detection and/or diagnosis of SARS-CoV-2 by FDA under an Emergency Use Authorization (EUA). This EUA will remain  in effect (meaning this test can be used) for the duration of the COVID-19 declaration under Section 564(b)(1) of the Act, 21 U.S.C. section 360bbb-3(b)(1), unless the authorization is terminated or revoked sooner.    Influenza A by PCR NEGATIVE NEGATIVE Final   Influenza B by PCR NEGATIVE NEGATIVE Final    Comment: (NOTE) The Xpert Xpress SARS-CoV-2/FLU/RSV assay is intended as an aid in  the diagnosis of influenza from Nasopharyngeal swab specimens and  should not be used as a sole basis for treatment. Nasal washings and  aspirates are unacceptable for Xpert Xpress SARS-CoV-2/FLU/RSV  testing. Fact Sheet for Patients: PinkCheek.be Fact Sheet for Healthcare Providers: GravelBags.it This test is not yet approved or cleared by the [place] FDA and  has been authorized for detection and/or diagnosis of SARS-CoV-2 by   FDA under an Emergency Use Authorization (EUA). This EUA will remain  in effect (meaning this test can be used) for the duration of the  Covid-19 declaration under Section 564(b)(1) of the Act, 21  U.S.C. section 360bbb-3(b)(1), unless the authorization is  terminated or revoked. Performed at [facility], [place] [address] [place], [place] [address]   Culture, blood (routine x 2) Call MD if unable to obtain prior to antibiotics being given     Status: None (Preliminary result)   Collection Time: 05/13/19  8:06 PM   Specimen: BLOOD  Result Value Ref Range Status   Specimen Description   Final    BLOOD RIGHT ANTECUBITAL Performed at [place] [address] [place], [place] [address]    Special Requests   Final    BOTTLES DRAWN AEROBIC AND ANAEROBIC Blood Culture results may not be optimal due to an excessive volume of blood received in culture bottles Performed at [place] [address] [place], [place] [address]  Culture   Final    NO GROWTH 3 DAYS Performed at [facility], [place] [address] [place], [place] [address]    Report Status PENDING  Incomplete  Culture, blood (routine x 2) Call MD if unable to obtain prior to antibiotics being given     Status: None (Preliminary result)   Collection Time: 05/13/19  8:15 PM   Specimen: BLOOD  Result Value Ref Range Status   Specimen Description   Final    BLOOD RIGHT HAND Performed at [place] [address], [place], [place] [address]    Special Requests   Final    BOTTLES DRAWN AEROBIC AND ANAEROBIC Blood Culture adequate volume Performed at [place] [address] [place], [place] [address]    Culture   Final    NO GROWTH 3 DAYS Performed at [facility], [place] [address] [place], [place] [address]    Report Status PENDING  Incomplete  Culture, sputum-assessment     Status: None   Collection Time:  05/13/19  8:34 PM   Specimen: Sputum  Result Value Ref Range Status   Specimen Description SPU  Final   Special Requests NONE  Final   Sputum evaluation   Final    THIS SPECIMEN IS ACCEPTABLE FOR SPUTUM CULTURE Performed at [facility], [place] [address], [place], [place] [address]    Report Status 05/13/2019 FINAL  Final  Culture, respiratory     Status: None   Collection Time: 05/13/19  8:34 PM   Specimen: Sputum  Result Value Ref Range Status   Specimen Description   Final    SPU Performed at [place] [address] [place], [place] [address]    Special Requests   Final    NONE Reflexed from [id number] Performed at [facility], [place] [address], [place], [place] [address]    Gram Stain   Final    FEW WBC PRESENT, PREDOMINANTLY PMN FEW SQUAMOUS EPITHELIAL CELLS PRESENT FEW GRAM POSITIVE COCCI IN PAIRS RARE BUDDING YEAST SEEN FEW GRAM NEGATIVE COCCOBACILLI    Culture   Final    FEW Consistent with normal respiratory flora. Performed at [facility], [place] [address] [place],  [address]    Report Status 05/16/2019 FINAL  Final     Time coordinating discharge: 40 minutes  SIGNED:   Barb Merino, MD  [facility] 05/16/2019, 2:31 PM

## 2019-05-16 NOTE — Care Management Important Message (Signed)
Important Message  Patient Details IM Letter given to Gabriel Earing RN Case Manager to present to the Patient Name: Patricia Nunez MRN: [id number] Date of Birth: 07/08/1955   Medicare Important Message Given:  Yes     Kerin Salen 05/16/2019, 10:31 AM

## 2019-05-16 NOTE — Progress Notes (Signed)
Patient walked in hallway on room air.  Oxygen saturation maintained between 92-97%.  Patient had no complaints during ambulation.  Will continue to monitor.

## 2019-05-16 NOTE — TOC Progression Note (Signed)
Transition of Care [facility]) - Progression Note    Patient Details  Name: Patricia Nunez MRN: [id number] Date of Birth: 12-16-1955  [facility] CM/SW Contact  Purcell Mouton, RN Phone Number: 05/16/2019, 2:46 PM  Clinical Narrative:     Pt discharged with no needs.        Expected Discharge Plan and Services           Expected Discharge Date: 05/16/19                                     Social Determinants of Health (SDOH) Interventions    Readmission Risk Interventions No flowsheet data found.

## 2019-05-18 LAB — CULTURE, BLOOD (ROUTINE X 2)
Culture: NO GROWTH
Culture: NO GROWTH
Culture: NO GROWTH
Special Requests: ADEQUATE
Special Requests: ADEQUATE

## 2019-05-20 DIAGNOSIS — R069 Unspecified abnormalities of breathing: Secondary | ICD-10-CM | POA: Diagnosis not present

## 2019-05-20 DIAGNOSIS — J189 Pneumonia, unspecified organism: Secondary | ICD-10-CM | POA: Diagnosis not present

## 2019-05-30 DIAGNOSIS — R0602 Shortness of breath: Secondary | ICD-10-CM | POA: Diagnosis not present

## 2019-06-03 DIAGNOSIS — R0602 Shortness of breath: Secondary | ICD-10-CM | POA: Diagnosis not present

## 2019-06-03 DIAGNOSIS — J189 Pneumonia, unspecified organism: Secondary | ICD-10-CM | POA: Diagnosis not present

## 2019-06-05 ENCOUNTER — Telehealth: Payer: Self-pay

## 2019-06-05 NOTE — Telephone Encounter (Signed)
Contacted pt to check on any lingering side effects from XRT. Pt reports skin is healing nicely and she is recovering slowly from recent PNA. Pt agreeable to rescheduling f/u appt with Dr. Sondra Come due to forecasted bad weather. Pt aware scheduler will call in next few days. Pt verbalized understanding and agreement. Loma Sousa, RN BSN

## 2019-06-06 ENCOUNTER — Ambulatory Visit: Payer: Medicare PPO | Admitting: Radiation Oncology

## 2019-06-10 DIAGNOSIS — R0602 Shortness of breath: Secondary | ICD-10-CM | POA: Diagnosis not present

## 2019-06-10 DIAGNOSIS — R0902 Hypoxemia: Secondary | ICD-10-CM | POA: Diagnosis not present

## 2019-06-10 DIAGNOSIS — L233 Allergic contact dermatitis due to drugs in contact with skin: Secondary | ICD-10-CM | POA: Diagnosis not present

## 2019-06-11 ENCOUNTER — Telehealth: Payer: Self-pay | Admitting: *Deleted

## 2019-06-11 NOTE — Telephone Encounter (Signed)
CALLED PATIENT TO INFORM OF FU APPT. WITH DR. Sierra Nunez ON 06-27-19 @ 9:15 AM, SPOKE WITH PATIENT AND SHE IS AWARE OF THIS APPT.

## 2019-06-17 DIAGNOSIS — M810 Age-related osteoporosis without current pathological fracture: Secondary | ICD-10-CM | POA: Diagnosis not present

## 2019-06-17 DIAGNOSIS — I10 Essential (primary) hypertension: Secondary | ICD-10-CM | POA: Diagnosis not present

## 2019-06-17 DIAGNOSIS — E039 Hypothyroidism, unspecified: Secondary | ICD-10-CM | POA: Diagnosis not present

## 2019-06-21 DIAGNOSIS — E039 Hypothyroidism, unspecified: Secondary | ICD-10-CM | POA: Diagnosis not present

## 2019-06-21 DIAGNOSIS — M810 Age-related osteoporosis without current pathological fracture: Secondary | ICD-10-CM | POA: Diagnosis not present

## 2019-06-21 DIAGNOSIS — C50919 Malignant neoplasm of unspecified site of unspecified female breast: Secondary | ICD-10-CM | POA: Diagnosis not present

## 2019-06-21 DIAGNOSIS — I10 Essential (primary) hypertension: Secondary | ICD-10-CM | POA: Diagnosis not present

## 2019-06-21 DIAGNOSIS — E063 Autoimmune thyroiditis: Secondary | ICD-10-CM | POA: Diagnosis not present

## 2019-06-27 ENCOUNTER — Other Ambulatory Visit: Payer: Self-pay

## 2019-06-27 ENCOUNTER — Encounter: Payer: Self-pay | Admitting: Radiation Oncology

## 2019-06-27 ENCOUNTER — Ambulatory Visit
Admission: RE | Admit: 2019-06-27 | Discharge: 2019-06-27 | Disposition: A | Discharge status: Home / Self Care | Payer: Medicare PPO | Source: Ambulatory Visit | Attending: Radiation Oncology | Admitting: Radiation Oncology

## 2019-06-27 VITALS — BP 147/85 | HR 119 | Resp 20 | Ht 65.0 in | Wt 107.6 lb

## 2019-06-27 DIAGNOSIS — Z923 Personal history of irradiation: Secondary | ICD-10-CM | POA: Insufficient documentation

## 2019-06-27 DIAGNOSIS — Z170 Estrogen receptor positive status [ER+]: Secondary | ICD-10-CM | POA: Diagnosis not present

## 2019-06-27 DIAGNOSIS — J189 Pneumonia, unspecified organism: Secondary | ICD-10-CM | POA: Diagnosis not present

## 2019-06-27 DIAGNOSIS — Z7982 Long term (current) use of aspirin: Secondary | ICD-10-CM | POA: Insufficient documentation

## 2019-06-27 DIAGNOSIS — Z79899 Other long term (current) drug therapy: Secondary | ICD-10-CM | POA: Insufficient documentation

## 2019-06-27 DIAGNOSIS — C50412 Malignant neoplasm of upper-outer quadrant of left female breast: Secondary | ICD-10-CM | POA: Insufficient documentation

## 2019-06-27 NOTE — Patient Instructions (Signed)
Coronavirus (COVID-19) Are you at risk?  Are you at risk for the Coronavirus (COVID-19)?  To be considered HIGH RISK for Coronavirus (COVID-19), you have to meet the following criteria:  . Traveled to [place], [place], [place], [place] or [place]; or in the [place] to [place], [place], [place], or [place]; and have fever, cough, and shortness of breath within the last 2 weeks of travel OR . Been in close contact with a person diagnosed with COVID-19 within the last 2 weeks and have fever, cough, and shortness of breath . IF YOU DO NOT MEET THESE CRITERIA, YOU ARE CONSIDERED LOW RISK FOR COVID-19.  What to do if you are HIGH RISK for COVID-19?  . If you are having a medical emergency, call 911. . Seek medical care right away. Before you go to a doctor's office, urgent care or emergency department, call ahead and tell them about your recent travel, contact with someone diagnosed with COVID-19, and your symptoms. You should receive instructions from your physician's office regarding next steps of care.  . When you arrive at healthcare provider, tell the healthcare staff immediately you have returned from visiting [place], [place], [place], [place] or [place]; or traveled in the [place] to [place], [place], [place], or [place]; in the last two weeks or you have been in close contact with a person diagnosed with COVID-19 in the last 2 weeks.   . Tell the health care staff about your symptoms: fever, cough and shortness of breath. . After you have been seen by a medical provider, you will be either: o Tested for (COVID-19) and discharged home on quarantine except to seek medical care if symptoms worsen, and asked to  - Stay home and avoid contact with others until you get your results (4-5 days)  - Avoid travel on public transportation if possible (such as bus, train, or airplane) or o Sent to the Emergency Department by EMS for evaluation, COVID-19 testing, and possible  admission depending on your condition and test results.  What to do if you are LOW RISK for COVID-19?  Reduce your risk of any infection by using the same precautions used for avoiding the common cold or flu:  . Wash your hands often with soap and warm water for at least 20 seconds.  If soap and water are not readily available, use an alcohol-based hand sanitizer with at least 60% alcohol.  . If coughing or sneezing, cover your mouth and nose by coughing or sneezing into the elbow areas of your shirt or coat, into a tissue or into your sleeve (not your hands). . Avoid shaking hands with others and consider head nods or verbal greetings only. . Avoid touching your eyes, nose, or mouth with unwashed hands.  . Avoid close contact with people who are sick. . Avoid places or events with large numbers of people in one location, like concerts or sporting events. . Carefully consider travel plans you have or are making. . If you are planning any travel outside or inside the [place], visit the CDC's Travelers' Health webpage for the latest health notices. . If you have some symptoms but not all symptoms, continue to monitor at home and seek medical attention if your symptoms worsen. . If you are having a medical emergency, call 911.   ADDITIONAL HEALTHCARE OPTIONS FOR PATIENTS  [facility] / e-Visit: [email or web address]         [facility]: [phone]     Urgent Care: [phone]                   [facility]: [phone]   

## 2019-06-27 NOTE — Progress Notes (Signed)
Patricia Nunez presents today for f/u with Dr. Sondra Come. Pt recently hospitalized with pneumonia and is slowly recovering. Pt attributes lingering fatigue to pneumonia. Pt denies c/o pain in breast, except occasional tingling sensation. Pt endorses using skin care cream that was provided during XRT on breast. Breast faintly hyperpigmented.   BP (!) 147/85 (BP Location: Right Arm, Patient Position: Sitting)   Pulse (!) 119   Resp 20   Ht 5\' 5"  (1.651 m)   Wt 107 lb 9.6 oz (48.8 kg)   SpO2 95%   BMI 17.91 kg/m   Wt Readings from Last 3 Encounters:  06/27/19 107 lb 9.6 oz (48.8 kg)  05/13/19 110 lb (49.9 kg)  05/13/19 110 lb 3 oz (50 kg)   Loma Sousa, RN BSN

## 2019-06-27 NOTE — Progress Notes (Signed)
Radiation Oncology         [phone] ________________________________  Name: Patricia Nunez MRN: [id number]  Date: 06/27/2019  DOB: 20-Apr-1955  Follow-Up Visit Note  CC: Iona Beard, MD  Nicholas Lose, MD    ICD-10-CM   1. Malignant neoplasm of upper-outer quadrant of left breast in female, estrogen receptor positive [facility])  C50.412    Z17.0     Diagnosis:   ClinicalStageT1b LeftBreast UOQ,Invasive DuctalCarcinoma, ER+/ PR+/ Her2-, Grade2  Interval Since Last Radiation:  2 months  04/01/2019 through 04/30/2019 Site Technique Total Dose (Gy) Dose per Fx (Gy) Completed Fx Beam Energies  Breast, Left: Breast_Lt 3D 40.05/40.05 2.67 15/15 6X  Breast, Left: Breast_Lt_Bst 3D 10/10 2 5/5 6X    Narrative:  The patient returns today for routine follow-up. She met with Dr. Lindi Adie for follow up on 04/30/2019 and was started on anastrozole. Pt recently hospitalized with pneumonia and is slowly recovering. Pt attributes lingering fatigue to pneumonia. Pt denies c/o pain in breast, except occasional tingling sensation   ALLERGIES:  is allergic to hydromet [hydrocodone-homatropine].  Meds: Current Outpatient Medications  Medication Sig Dispense Refill  . albuterol (VENTOLIN HFA) 108 (90 Base) MCG/ACT inhaler Inhale 1 puff into the lungs every 4 (four) hours as needed for wheezing or shortness of breath.    Marland Kitchen alendronate (FOSAMAX) 70 MG tablet Take 70 mg by mouth once a week. Sundays    . amLODipine (NORVASC) 5 MG tablet Take 5 mg by mouth daily.    Marland Kitchen anastrozole (ARIMIDEX) 1 MG tablet Take 1 tablet (1 mg total) by mouth daily. 90 tablet 3  . aspirin 81 MG tablet Take 81 mg by mouth daily.      Marland Kitchen b complex vitamins tablet Take 1 tablet by mouth daily.    . benzonatate (TESSALON) 100 MG capsule Take 100 mg by mouth 3 (three) times daily as needed for cough.    . Biotin 5 MG CAPS Take by mouth.    . calcium carbonate (OS-CAL) 600 MG TABS Take 600 mg by mouth daily with breakfast.      . cholecalciferol (VITAMIN D3) 25 MCG (1000 UT) tablet Take 1,000 Units by mouth daily.     Marland Kitchen loratadine (CLARITIN) 10 MG tablet Take 10 mg by mouth daily as needed for allergies.    . Multiple Vitamin (MULTIVITAMIN) capsule Take 1 capsule by mouth daily.      Marland Kitchen Specialty Vitamins Products (ECHINACEA C COMPLETE PO) Take by mouth.    . vitamin E 100 UNIT capsule Take 100 Units by mouth daily.      Marland Kitchen zinc gluconate 50 MG tablet Take 50 mg by mouth daily.     No current facility-administered medications for this encounter.    Physical Findings: The patient is in no acute distress. Patient is alert and oriented.  height is _0  (1.651 m) and weight is 107 lb 9.6 oz (48.8 kg). Her blood pressure is 147/85 (abnormal) and her pulse is 119 (abnormal). Her respiration is 20 and oxygen saturation is 95%. .  No significant changes. Lungs are clear to auscultation bilaterally. Heart has regular rate and rhythm. No palpable cervical, supraclavicular, or axillary adenopathy. Abdomen soft, non-tender, normal bowel sounds. Right Breast: no palpable mass, nipple discharge or bleeding. Left Breast: Skin is well-healed.  Hyperpigmentation changes noted.  Induration at the lumpectomy site but no palpable mass nipple discharge or bleeding  Lab Findings: Lab Results  Component Value Date   WBC 15.7 (H) 05/16/2019  HGB 12.8 05/16/2019   HCT 39.2 05/16/2019   MCV 92.0 05/16/2019   PLT 299 05/16/2019    Radiographic Findings: No results found.  Impression:  ClinicalStageT1b LeftBreast UOQ,Invasive DuctalCarcinoma, ER+/ PR+/ Her2-, Grade2  The patient is recovering from the effects of radiation.  No evidence of recurrence on clinical exam today.  Overall patient tolerated her radiation therapy well  Plan: As needed follow-up in radiation oncology.  She will continue to follow-up with medical oncology and continue on adjuvant hormonal therapy.  ____________________________________ Gery Pray,  MD   This document serves as a record of services personally performed by Gery Pray, MD. It was created on his behalf by Wilburn Mylar, a trained medical scribe. The creation of this record is based on the scribe's personal observations and the provider's statements to them. This document has been checked and approved by the attending provider.

## 2019-07-29 ENCOUNTER — Inpatient Hospital Stay: Payer: Medicare PPO | Attending: Hematology and Oncology | Admitting: Adult Health

## 2019-07-29 ENCOUNTER — Encounter: Payer: Self-pay | Admitting: Adult Health

## 2019-07-29 ENCOUNTER — Other Ambulatory Visit: Payer: Self-pay

## 2019-07-29 ENCOUNTER — Telehealth: Payer: Self-pay

## 2019-07-29 VITALS — BP 159/93 | HR 109 | Temp 98.3°F | Resp 18 | Ht 65.0 in | Wt 106.8 lb

## 2019-07-29 DIAGNOSIS — Z79811 Long term (current) use of aromatase inhibitors: Secondary | ICD-10-CM | POA: Diagnosis not present

## 2019-07-29 DIAGNOSIS — C50412 Malignant neoplasm of upper-outer quadrant of left female breast: Secondary | ICD-10-CM | POA: Insufficient documentation

## 2019-07-29 DIAGNOSIS — Z170 Estrogen receptor positive status [ER+]: Secondary | ICD-10-CM | POA: Diagnosis not present

## 2019-07-29 NOTE — Telephone Encounter (Signed)
Per Mendel Ryder NP called [phone] [facility] Dr. Chalmers Cater office to fax bone density results. They did not answer left voicemail of request and asked for a call back. Will try again later

## 2019-07-29 NOTE — Progress Notes (Signed)
Bone density scan fax received. Given to Caspar

## 2019-07-29 NOTE — Progress Notes (Signed)
SURVIVORSHIP VIRTUAL VISIT:    BRIEF ONCOLOGIC HISTORY:  Oncology History  Malignant neoplasm of upper-outer quadrant of left breast in female, estrogen receptor positive ([place])  01/22/2019 Initial Diagnosis   Patient palpated a left breast mass. Mammogram showed a 0.9cm mass in the left breast at the 3 o'clock position, no left axillary adenopathy. 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    02/07/2019 Genetic Testing   Negative genetic testing: no pathogenic variants identified on the [facility] Breast Cancer STAT panel + Common Hereditary Cancers panel. 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 The report date is 02/07/2019.  The STAT Breast cancer panel offered by [facility] includes sequencing and rearrangement analysis for the following 9 genes:  ATM, BRCA1, BRCA2, CDH1, CHEK2, PALB2, PTEN, STK11 and TP53.  The Common Hereditary Cancers Panel offered by [facility] includes sequencing and/or deletion duplication testing of the following 47 genes: APC, ATM, AXIN2, BARD1, BMPR1A, BRCA1, BRCA2, BRIP1, CDH1, CDK4, CDKN2A (p14ARF), CDKN2A (p16INK4a), CHEK2, CTNNA1, DICER1, EPCAM (Deletion/duplication testing only), GREM1 (promoter region deletion/duplication testing only), KIT, MEN1, MLH1, MSH2, MSH3, MSH6, MUTYH, NBN, NF1, NHTL1, PALB2, PDGFRA, PMS2, POLD1, POLE, PTEN, RAD50, RAD51C, RAD51D, SDHB, SDHC, SDHD, SMAD4, SMARCA4. STK11, TP53, TSC1, TSC2, and VHL.  The following genes were evaluated for sequence changes only: SDHA and HOXB13 c.251G>A variant only.   02/21/2019 Surgery   Left lumpectomy ([place]) ([id number]): IDC, 0.9cm, grade 2, clear margins, 2 left axillary lymph nodes negative.   02/21/2019 Cancer Staging   Staging form: Breast, AJCC 8th Edition - Pathologic stage from 02/21/2019: Stage IA (pT1b, pN0, cM0, G2, ER+, PR+, HER2-)    03/04/2019 Oncotype testing   The Oncotype DX score was 3 predicting a risk of  outside the breast recurrence over the next 9 years of 3% if the patient's only systemic therapy is tamoxifen for 5 years.     04/01/2019 - 04/30/2019 Radiation Therapy   The patient initially received a dose of 40.05 Gy in 15 fractions to the breast using whole-breast tangent fields. This was delivered using a 3-D conformal technique. The pt received a boost delivering an additional 10 Gy in 5 fractions using a electron boost with 3mV electrons. The total dose was 50.05 Gy.    04/2019 - 04/2024 Anti-estrogen oral therapy   Anastrozole     INTERVAL HISTORY:  Ms. BTamerto review her survivorship care plan detailing her treatment course for breast cancer, as well as monitoring long-term side effects of that treatment, education regarding health maintenance, screening, and overall wellness and health promotion.     Overall, Ms. BGassenreports feeling quite well.  She was hospitalized with pneumonia in 04/2019.  She has remained short of breath, and has limited activity level due to this.  She remains on Anastrozole daily and is tolerating this well.    She completed a survivorship survey.  She was 113 pounds a in late 2020 and 106 today.  She has no new pain.  She is up to date with her cancer screenings.  She is does have difficulty falling asleep and staying asleep.   She is meeting with her PCP today to review this and to discuss night time pulse oximeter.    She notes that her most recent bone density was completed last year with Dr. BChalmers Cater    REVIEW OF SYSTEMS:  Review of Systems  Constitutional: Negative for appetite change, chills, diaphoresis, fatigue and fever.  HENT:   Negative for hearing loss, lump/mass and trouble swallowing.   Eyes: Negative for eye problems.  Respiratory: Positive for shortness of breath (since pneumonia diagnosis in 04/2019). Negative for chest tightness and cough.   Cardiovascular: Negative for chest pain, leg swelling and palpitations.  Gastrointestinal:  Negative for abdominal distention, abdominal pain, constipation, diarrhea, nausea and vomiting.  Endocrine: Negative for hot flashes.  Genitourinary: Negative for difficulty urinating.   Musculoskeletal: Negative for arthralgias.  Skin: Negative for itching and rash.  Neurological: Negative for dizziness, extremity weakness, headaches and numbness.  Hematological: Negative for adenopathy. Does not bruise/bleed easily.  Psychiatric/Behavioral: Positive for sleep disturbance. Negative for depression. The patient is not nervous/anxious.   Breast: Denies any new nodularity, masses, tenderness, nipple changes, or nipple discharge.      ONCOLOGY TREATMENT TEAM:  1. Surgeon:  Dr. Brantley Stage at [facility] 2. Medical Oncologist: Dr. Lindi Adie  3. Radiation Oncologist: Dr. Sondra Come    PAST MEDICAL/SURGICAL HISTORY:  Past Medical History:  Diagnosis Date  . Cancer ([place])   . Colon polyps   . Family history of breast cancer   . Family history of leukemia   . Hypertension   . PONV (postoperative nausea and vomiting)   . Thyroid disease    h/o hyperactive, just being followed by endocrinologist   Past Surgical History:  Procedure Laterality Date  . ABDOMINAL HYSTERECTOMY  2000  . BREAST LUMPECTOMY WITH RADIOACTIVE SEED AND SENTINEL LYMPH NODE BIOPSY Left 02/21/2019   Procedure: LEFT BREAST LUMPECTOMY WITH RADIOACTIVE SEED;  Surgeon: Erroll Luna, MD;  Location: [facility];  Service: General;  Laterality: Left;  . CATARACT EXTRACTION W/PHACO Left 03/10/2014   Procedure: CATARACT EXTRACTION PHACO AND INTRAOCULAR LENS PLACEMENT LEFT EYE;  Surgeon: Tonny Branch, MD;  Location: [facility];  Service: Ophthalmology;  Laterality: Left;  CDE 3.50  . CATARACT EXTRACTION W/PHACO Right 04/14/2014   Procedure: CATARACT EXTRACTION PHACO AND INTRAOCULAR LENS PLACEMENT (IOC);  Surgeon: Tonny Branch, MD;  Location: [facility];  Service: Ophthalmology;  Laterality: Right;  CDE 3.35  . COLONOSCOPY  11/2008   Dr. Gala Romney:  normal. Next colonoscopy in 2015 due to h/o polyps by Dr. Tamala Julian and path unknown  . COLONOSCOPY N/A 12/12/2014   Procedure: COLONOSCOPY;  Surgeon: Daneil Dolin, MD;  [facility];  Service: Endoscopy;  Laterality: N/A;  1230  . GANGLION CYST EXCISION Left    x2-wrist- [place]  . SENTINEL NODE BIOPSY Left 02/21/2019   Procedure: Left Sentinel Lymph Node Mapping;  Surgeon: Erroll Luna, MD;  Location: [facility];  Service: General;  Laterality: Left;     ALLERGIES:  Allergies  Allergen Reactions  . Hydromet [Hydrocodone-Homatropine] Nausea And Vomiting     CURRENT MEDICATIONS:  Outpatient Encounter Medications as of 07/29/2019  Medication Sig  . albuterol (VENTOLIN HFA) 108 (90 Base) MCG/ACT inhaler Inhale 1 puff into the lungs every 4 (four) hours as needed for wheezing or shortness of breath.  Marland Kitchen alendronate (FOSAMAX) 70 MG tablet Take 70 mg by mouth once a week. Sundays  . amLODipine (NORVASC) 5 MG tablet Take 5 mg by mouth daily.  Marland Kitchen anastrozole (ARIMIDEX) 1 MG tablet Take 1 tablet (1 mg total) by mouth daily.  Marland Kitchen aspirin 81 MG tablet Take 81 mg by mouth daily.    Marland Kitchen b complex vitamins tablet Take 1 tablet by mouth daily.  . benzonatate (TESSALON) 100 MG capsule Take 100 mg by mouth 3 (three) times daily as needed for cough.  Marland Kitchen  Biotin 5 MG CAPS Take by mouth.  . calcium carbonate (OS-CAL) 600 MG TABS Take 600 mg by mouth daily with breakfast.   . cholecalciferol (VITAMIN D3) 25 MCG (1000 UT) tablet Take 1,000 Units by mouth daily.   Marland Kitchen loratadine (CLARITIN) 10 MG tablet Take 10 mg by mouth daily as needed for allergies.  . Multiple Vitamin (MULTIVITAMIN) capsule Take 1 capsule by mouth daily.    Marland Kitchen Specialty Vitamins Products (ECHINACEA C COMPLETE PO) Take by mouth.  . vitamin E 100 UNIT capsule Take 100 Units by mouth daily.    Marland Kitchen zinc gluconate 50 MG tablet Take 50 mg by mouth daily.   No facility-administered encounter medications on file as of 07/29/2019.     ONCOLOGIC  FAMILY HISTORY:  Family History  Problem Relation Age of Onset  . Breast cancer Sister        diagnosed in her 52s  . Stroke Sister   . Breast cancer Maternal Grandmother        diagnosed in her 8s or 24s  . Breast cancer Sister        diagnosed in her 24s  . Stroke Mother   . Leukemia Maternal Aunt        diagnosed in her 61s  . Colon cancer Neg Hx      GENETIC COUNSELING/TESTING: See above  SOCIAL HISTORY:  Social History   Socioeconomic History  . Marital status: Divorced    Spouse name: Not on file  . Number of children: 1  . Years of education: Not on file  . Highest education level: Not on file  Occupational History  . Occupation: school system  Tobacco Use  . Smoking status: Never Smoker  . Smokeless tobacco: Never Used  . Tobacco comment: Never smoked  Substance and Sexual Activity  . Alcohol use: No    Alcohol/week: 0.0 standard drinks  . Drug use: No  . Sexual activity: Yes    Birth control/protection: Surgical  Other Topics Concern  . Not on file  Social History Narrative  . Not on file   Social Determinants of Health   Financial Resource Strain:   . Difficulty of Paying Living Expenses:   Food Insecurity:   . Worried About Charity fundraiser in the Last Year:   . Arboriculturist in the Last Year:   Transportation Needs:   . Film/video editor (Medical):   Marland Kitchen Lack of Transportation (Non-Medical):   Physical Activity:   . Days of Exercise per Week:   . Minutes of Exercise per Session:   Stress:   . Feeling of Stress :   Social Connections:   . Frequency of Communication with Friends and Family:   . Frequency of Social Gatherings with Friends and Family:   . Attends Religious Services:   . Active Member of Clubs or Organizations:   . Attends Archivist Meetings:   Marland Kitchen Marital Status:   Intimate Partner Violence:   . Fear of Current or Ex-Partner:   . Emotionally Abused:   Marland Kitchen Physically Abused:   . Sexually Abused:       OBSERVATIONS/OBJECTIVE:  BP (!) 159/93 (BP Location: Right Arm, Patient Position: Sitting)   Pulse (!) 109   Temp 98.3 F (36.8 C) (Temporal)   Resp 18   Ht 5' 5" (1.651 m)   Wt 106 lb 12.8 oz (48.4 kg)   SpO2 92%   BMI 17.77 kg/m  GENERAL: Patient is a well appearing  female in no acute distress HEENT:  Sclerae anicteric.  Oropharynx clear and moist. No ulcerations or evidence of oropharyngeal candidiasis. Neck is supple.  NODES:  No cervical, supraclavicular, or axillary lymphadenopathy palpated.  BREAST EXAM:  Left breast s/p lumpectomy, no sign of local recurrence, right breast benign LUNGS:  Clear to auscultation bilaterally.  No wheezes or rhonchi. HEART:  Regular rate and rhythm. No murmur appreciated. ABDOMEN:  Soft, nontender.  Positive, normoactive bowel sounds. No organomegaly palpated. MSK:  No focal spinal tenderness to palpation. Full range of motion bilaterally in the upper extremities. EXTREMITIES:  No peripheral edema.   SKIN:  Clear with no obvious rashes or skin changes. No nail dyscrasia. NEURO:  Nonfocal. Well oriented.  Appropriate affect.   LABORATORY DATA:  None for this visit.  DIAGNOSTIC IMAGING:  None for this visit.      ASSESSMENT AND PLAN:  Ms.. Branscum is a pleasant 64 y.o. female with Stage IA left breast invasive ductal carcinoma, ER+/PR+/HER2-, diagnosed in 12/2018, treated with lumpectomy, adjuvant radiation therapy, and anti-estrogen therapy with Anastrozole beginning in 04/2019.  She presents to the Survivorship Clinic for our initial meeting and routine follow-up post-completion of treatment for breast cancer.    1. Stage IA left breast cancer:  Ms. Murtaugh is continuing to recover from definitive treatment for breast cancer. She will follow-up with her medical oncologist, Dr. Lindi Adie in 11/2019 with history and physical exam per surveillance protocol.  She will continue her anti-estrogen therapy with Anastrozole. Thus far, she is tolerating  the Anastrozole well, with minimal side effects. She was instructed to make Dr. Lindi Adie or myself aware if she begins to experience any worsening side effects of the medication and I could see her back in clinic to help manage those side effects, as needed. Her mammogram is due 10/2019; orders placed today. Today, a comprehensive survivorship care plan and treatment summary was reviewed with the patient today detailing her breast cancer diagnosis, treatment course, potential late/long-term effects of treatment, appropriate follow-up care with recommendations for the future, and patient education resources.  A copy of this summary, along with a letter will be sent to the patient's primary care provider via mail/fax/In Basket message after today's visit.    2. Shortness of breath: This is related to her bilateral pneumonia diagnosed in 04/2019.  I recommended she continue f/u with her PCP and consider evaluation with pulmonology considering the effect that it has had on her quality of life.    3.  Positive distress screen: She was given handouts on financial resources, and support services.  QOL-CSV completed.    4. Bone health:  Given Ms. Donnelly's age/history of breast cancer and her current treatment regimen including anti-estrogen therapy with anastrozole, she is at risk for bone demineralization.  Her last DEXA scan was last year which she completed with endocrinology.  I requested these records be faxed over to [place].  In the meantime, she was encouraged to increase her consumption of foods rich in calcium, as well as increase her weight-bearing activities.  She was given education on specific activities to promote bone health.  5. Cancer screening:  Due to Ms. Wach's history and her age, she should receive screening for skin cancers, colon cancer, and gynecologic cancers.  The information and recommendations are listed on the patient's comprehensive care plan/treatment summary and were reviewed in detail  with the patient.    6. Health maintenance and wellness promotion: Ms. Lahmann was encouraged to consume 5-7 servings of  fruits and vegetables per day. We reviewed the "Nutrition Rainbow" handout, as well as the handout "Take Control of Your Health and Reduce Your Cancer Risk" from the [place].  She was also encouraged to engage in moderate to vigorous exercise for 30 minutes per day most days of the week. We discussed the [employer] fitness program, which is designed for cancer survivors to help them become more physically fit after cancer treatments.  She was instructed to limit her alcohol consumption and continue to abstain from tobacco use.     7. Support services/counseling: It is not uncommon for this period of the patient's cancer care trajectory to be one of many emotions and stressors.  We discussed how this can be increasingly difficult during the times of quarantine and social distancing due to the COVID-19 pandemic.   She was given information regarding our available services and encouraged to contact me with any questions or for help enrolling in any of our support group/programs.    Follow up instructions:    -Return to cancer center in 11/2019 for f/u with Dr. Lindi Adie  -Mammogram due in 10/2019 -Follow up with surgery 08/2019   The patient was provided an opportunity to ask questions and all were answered. The patient agreed with the plan and demonstrated an understanding of the instructions.   Total encounter time: 45 minutes  Wilber Bihari, NP 07/29/19 9:29 AM Medical Oncology and Hematology [facility] [place], [place] [address] Tel. [phone]    Fax. [phone]  *Total Encounter Time as defined by the Centers for Medicare and Medicaid Services includes, in addition to the face-to-face time of a patient visit (documented in the note above) non-face-to-face time: obtaining and reviewing outside history, ordering and  reviewing medications, tests or procedures, care coordination (communications with other health care professionals or caregivers) and documentation in the medical record.

## 2019-07-30 ENCOUNTER — Telehealth: Payer: Self-pay | Admitting: Adult Health

## 2019-07-30 DIAGNOSIS — R000 Tachycardia, unspecified: Secondary | ICD-10-CM | POA: Diagnosis not present

## 2019-07-30 DIAGNOSIS — J9801 Acute bronchospasm: Secondary | ICD-10-CM | POA: Diagnosis not present

## 2019-07-30 DIAGNOSIS — R05 Cough: Secondary | ICD-10-CM | POA: Diagnosis not present

## 2019-07-30 NOTE — Telephone Encounter (Signed)
Scheduled appt per 4/12 los. Left voicemail with new appt details. Mailed appt reminder and calendar.

## 2019-08-01 ENCOUNTER — Encounter: Payer: Self-pay | Admitting: Licensed Clinical Social Worker

## 2019-08-01 NOTE — Progress Notes (Signed)
[facility] Quality of Life Screening Clinical Social Work  [facility] representative spoke with patient via phone to assess for needs. Patient reports that she is doing better but has some days where she is still worried. She relies on her upbringing that taught her to be strong and on the ways that she has handled other adversity in her life (son's father dying in a car accident, son having an accident and being in a coma for days). She has strong support from her son, daughter-in-law, and friends, and relies strongly on her faith and relationship with God. She had retired and then was working part-time, but has not restarted that position yet.  Discussed common themes in survivorship including fear of recurrence and psychological distress. CSW provided information on [place] support services and encouraged participation in [facility] and/or support group.    Follow up plan: 1. Patient to contact this CSW if interested in participating in [facility] or needing additional support 2. Follow-up QOL screen to be sent in 1 month     Rhiannan Kievit E, LCSW

## 2019-08-01 NOTE — Progress Notes (Signed)
[facility] Quality of Life Screening Clinical Social Work  Clinical Social Work was referred by survivorship quality of life screening protocol.  The patient scored a 26.05 on the Quality of Life/ Cancer Survivor (QOL-CS) scale which indicates moderate quality of life.  Based on screener, appearance changes, anxiety, fear of recurrence, and finances are concerns.  Clinical Social Worker attempted to contact patient by phone to assess for needs. No answer, left VM with contact information for a call back.     Kareli Hossain E, LCSW

## 2019-08-06 DIAGNOSIS — R0902 Hypoxemia: Secondary | ICD-10-CM | POA: Diagnosis not present

## 2019-08-06 DIAGNOSIS — J449 Chronic obstructive pulmonary disease, unspecified: Secondary | ICD-10-CM | POA: Diagnosis not present

## 2019-08-12 DIAGNOSIS — R000 Tachycardia, unspecified: Secondary | ICD-10-CM | POA: Diagnosis not present

## 2019-08-12 DIAGNOSIS — R05 Cough: Secondary | ICD-10-CM | POA: Diagnosis not present

## 2019-08-26 ENCOUNTER — Encounter: Payer: Self-pay | Admitting: Cardiology

## 2019-08-26 ENCOUNTER — Encounter: Payer: Self-pay | Admitting: *Deleted

## 2019-08-26 ENCOUNTER — Other Ambulatory Visit: Payer: Self-pay

## 2019-08-26 ENCOUNTER — Ambulatory Visit: Payer: Medicare PPO | Admitting: Cardiology

## 2019-08-26 VITALS — BP 132/70 | HR 115 | Ht 65.0 in | Wt 107.0 lb

## 2019-08-26 DIAGNOSIS — R0602 Shortness of breath: Secondary | ICD-10-CM | POA: Diagnosis not present

## 2019-08-26 DIAGNOSIS — R000 Tachycardia, unspecified: Secondary | ICD-10-CM

## 2019-08-26 NOTE — Progress Notes (Signed)
Clinical Summary Patricia Nunez is a 64 y.o.female seen as new consult, referred by Dr Berdine Addison for SOB and tachycardia  1. Tachycardia - can have some palpitations with high levels of activity but not at rest - reports his was an issue since her admission Jan 2021 with pneumona - CT PE at that time was negative.    2. 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 SOB - prior admissoin in Jan with pneumonia - since that time ongoing SOB.   - DOE with walking up a flight of stairs which is new - +cough, +wheezng. Productive cough greenish/yellowish. Most often at night time.  - uses prn albuterol with some improvement - never smoker. No significant second hand smoke exposure.  - no chest pains. No LE edema.  - home O2 low 90s   4. Breast cancer - has had radiation treatment earlier in January.   5. Hyperthyroid - followed by endo  Past Medical History:  Diagnosis Date  . Cancer ([place])   . Colon polyps   . Family history of breast cancer   . Family history of leukemia   . Hypertension   . PONV (postoperative nausea and vomiting)   . Thyroid disease    h/o hyperactive, just being followed by endocrinologist     Allergies  Allergen Reactions  . Hydromet [Hydrocodone-Homatropine] Nausea And Vomiting     Current Outpatient Medications  Medication Sig Dispense Refill  . albuterol (VENTOLIN HFA) 108 (90 Base) MCG/ACT inhaler Inhale 1 puff into the lungs every 4 (four) hours as needed for wheezing or shortness of breath.    Marland Kitchen alendronate (FOSAMAX) 70 MG tablet Take 70 mg by mouth once a week. Sundays    . amLODipine (NORVASC) 5 MG tablet Take 5 mg by mouth daily.    Marland Kitchen anastrozole (ARIMIDEX) 1 MG tablet Take 1 tablet (1 mg total) by mouth daily. 90 tablet 3  . aspirin 81 MG tablet Take 81 mg by mouth daily.      Marland Kitchen b complex vitamins tablet Take 1 tablet by mouth daily.    . benzonatate (TESSALON) 100 MG capsule Take 100 mg by mouth 3 (three) times daily as  needed for cough.    . Biotin 5 MG CAPS Take by mouth.    . calcium carbonate (OS-CAL) 600 MG TABS Take 600 mg by mouth daily with breakfast.     . cholecalciferol (VITAMIN D3) 25 MCG (1000 UT) tablet Take 1,000 Units by mouth daily.     Marland Kitchen loratadine (CLARITIN) 10 MG tablet Take 10 mg by mouth daily as needed for allergies.    . Multiple Vitamin (MULTIVITAMIN) capsule Take 1 capsule by mouth daily.      Marland Kitchen Specialty Vitamins Products (ECHINACEA C COMPLETE PO) Take by mouth.    . vitamin E 100 UNIT capsule Take 100 Units by mouth daily.      Marland Kitchen zinc gluconate 50 MG tablet Take 50 mg by mouth daily.     No current facility-administered medications for this visit.     Past Surgical History:  Procedure Laterality Date  . ABDOMINAL HYSTERECTOMY  2000  . BREAST LUMPECTOMY WITH RADIOACTIVE SEED AND SENTINEL LYMPH NODE BIOPSY Left 02/21/2019   Procedure: LEFT BREAST LUMPECTOMY WITH RADIOACTIVE SEED;  Surgeon: Erroll Luna, MD;  Location: [facility];  Service: General;  Laterality: Left;  . CATARACT EXTRACTION W/PHACO Left 03/10/2014   Procedure: CATARACT EXTRACTION PHACO AND INTRAOCULAR LENS PLACEMENT LEFT  EYE;  Surgeon: Tonny Katharine Rochefort, MD;  [facility];  Service: Ophthalmology;  Laterality: Left;  CDE 3.50  . CATARACT EXTRACTION W/PHACO Right 04/14/2014   Procedure: CATARACT EXTRACTION PHACO AND INTRAOCULAR LENS PLACEMENT (IOC);  Surgeon: Tonny Lloyd Ayo, MD;  [facility]: [facility];  Service: Ophthalmology;  Laterality: Right;  CDE 3.35  . COLONOSCOPY  11/2008   Dr. Gala Romney: normal. Next colonoscopy in 2015 due to h/o polyps by Dr. Tamala Julian and path unknown  . COLONOSCOPY N/A 12/12/2014   Procedure: COLONOSCOPY;  Surgeon: Daneil Dolin, MD;  [facility];  Service: Endoscopy;  Laterality: N/A;  1230  . GANGLION CYST EXCISION Left    x2-wrist- [place]  . SENTINEL NODE BIOPSY Left 02/21/2019   Procedure: Left Sentinel Lymph Node Mapping;  Surgeon: Erroll Luna, MD;  Location: [facility];  Service: General;   Laterality: Left;     Allergies  Allergen Reactions  . Hydromet [Hydrocodone-Homatropine] Nausea And Vomiting      Family History  Problem Relation Age of Onset  . Breast cancer Sister        diagnosed in her 4s  . Stroke Sister   . Breast cancer Maternal Grandmother        diagnosed in her 26s or 68s  . Breast cancer Sister        diagnosed in her 18s  . Stroke Mother   . Leukemia Maternal Aunt        diagnosed in her 77s  . Colon cancer Neg Hx      Social History Ms. Gallardo reports that she has never smoked. She has never used smokeless tobacco. Ms. Filbeck reports no history of alcohol use.   Review of Systems CONSTITUTIONAL: No weight loss, fever, chills, weakness or fatigue.  HEENT: Eyes: No visual loss, blurred vision, double vision or yellow sclerae.No hearing loss, sneezing, congestion, runny nose or sore throat.  SKIN: No rash or itching.  CARDIOVASCULAR: per hpi RESPIRATORY: No shortness of breath, cough or sputum.  GASTROINTESTINAL: No anorexia, nausea, vomiting or diarrhea. No abdominal pain or blood.  GENITOURINARY: No burning on urination, no polyuria NEUROLOGICAL: No headache, dizziness, syncope, paralysis, ataxia, numbness or tingling in the extremities. No change in bowel or bladder control.  MUSCULOSKELETAL: No muscle, back pain, joint pain or stiffness.  LYMPHATICS: No enlarged nodes. No history of splenectomy.  PSYCHIATRIC: No history of depression or anxiety.  ENDOCRINOLOGIC: No reports of sweating, cold or heat intolerance. No polyuria or polydipsia.  Marland Kitchen   Physical Examination Today's Vitals   08/26/19 1314  BP: 132/70  Pulse: (!) 115  SpO2: 92%  Weight: 107 lb (48.5 kg)  Height: 5\' 5"  (1.651 m)   Body mass index is 17.81 kg/m.  Gen: resting comfortably, no acute distress HEENT: no scleral icterus, pupils equal round and reactive, no palptable cervical adenopathy,  CV: RRR, no m/r/g, no jvd Resp: Clear to auscultation  bilaterally GI: abdomen is soft, non-tender, non-distended, normal bowel sounds, no hepatosplenomegaly MSK: extremities are warm, no edema.  Skin: warm, no rash Neuro:  no focal deficits Psych: appropriate affect     Assessment and Plan  1.Tachycardia - unclear etiology, on going several months - CT PE in Jan negative. Has not been anemic, she reports recent normal TSH with endocrinologist we will request results, no signs of hypovolemia or active pain.  - with tachy and SOB and prior radiation treatments obtain echo to evaluate for any underlying structural heart disease - EKG today shows sinus tach  110s  2. SOB - resting O2 sat 92%, she has nighttime hypoxia and is on O2 at night time - reports symptosm ongoign since pneumonia in January - with SOB, tach, and radiation treatments obtain echo. If normal would plan for PFTs, I think more likely primary issue is pulmonary but will exclude a cardiac condition - no signs of fluid overload on exam        Arnoldo Lenis, M.D.

## 2019-08-26 NOTE — Patient Instructions (Signed)
Your physician recommends that you schedule a follow-up appointment in: 1 MONTH WITH DR BRANCH  Your physician recommends that you continue on your current medications as directed. Please refer to the Current Medication list given to you today.  Your physician has requested that you have an echocardiogram. Echocardiography is a painless test that uses sound waves to create images of your heart. It provides your doctor with information about the size and shape of your heart and how well your heart's chambers and valves are working. This procedure takes approximately one hour. There are no restrictions for this procedure.  Thank you for choosing [facility]!!    

## 2019-08-27 DIAGNOSIS — R05 Cough: Secondary | ICD-10-CM | POA: Diagnosis not present

## 2019-08-27 DIAGNOSIS — J9801 Acute bronchospasm: Secondary | ICD-10-CM | POA: Diagnosis not present

## 2019-08-27 DIAGNOSIS — R000 Tachycardia, unspecified: Secondary | ICD-10-CM | POA: Diagnosis not present

## 2019-09-04 DIAGNOSIS — C50912 Malignant neoplasm of unspecified site of left female breast: Secondary | ICD-10-CM | POA: Diagnosis not present

## 2019-09-05 DIAGNOSIS — R0902 Hypoxemia: Secondary | ICD-10-CM | POA: Diagnosis not present

## 2019-09-05 DIAGNOSIS — J449 Chronic obstructive pulmonary disease, unspecified: Secondary | ICD-10-CM | POA: Diagnosis not present

## 2019-09-09 ENCOUNTER — Ambulatory Visit (HOSPITAL_COMMUNITY)
Admission: RE | Admit: 2019-09-09 | Discharge: 2019-09-09 | Disposition: A | Discharge status: Home / Self Care | Payer: Medicare PPO | Source: Ambulatory Visit | Attending: Cardiology | Admitting: Cardiology

## 2019-09-09 ENCOUNTER — Other Ambulatory Visit: Payer: Self-pay

## 2019-09-09 DIAGNOSIS — R0602 Shortness of breath: Secondary | ICD-10-CM | POA: Diagnosis not present

## 2019-09-09 NOTE — Progress Notes (Signed)
*  PRELIMINARY RESULTS* Echocardiogram 2D Echocardiogram has been performed.  Patricia Nunez 09/09/2019, 10:15 AM

## 2019-09-12 ENCOUNTER — Telehealth: Payer: Self-pay

## 2019-09-12 NOTE — Telephone Encounter (Signed)
Pt does not fully understand ECHO result in My-Chart.  Please call [phone]   Thanks renee

## 2019-09-12 NOTE — Telephone Encounter (Signed)
I don't see where test was resulted.I will send to Dr.Branch

## 2019-09-13 ENCOUNTER — Telehealth: Payer: Self-pay | Admitting: Cardiology

## 2019-09-13 NOTE — Telephone Encounter (Signed)
  Patient Consent for Virtual Visit   3      Patricia Nunez has provided verbal consent on 09/13/2019 for a virtual visit (video or telephone).   CONSENT FOR VIRTUAL VISIT FOR:  Patricia Nunez  By participating in this virtual visit I agree to the following:  I hereby voluntarily request, consent and authorize [facility] and its employed or contracted physicians, physician assistants, nurse practitioners or other licensed health care professionals (the Practitioner), to provide me with telemedicine health care services (the "Services") as deemed necessary by the treating Practitioner. I acknowledge and consent to receive the Services by the Practitioner via telemedicine. I understand that the telemedicine visit will involve communicating with the Practitioner through live audiovisual communication technology and the disclosure of certain medical information by electronic transmission. I acknowledge that I have been given the opportunity to request an in-person assessment or other available alternative prior to the telemedicine visit and am voluntarily participating in the telemedicine visit.  I understand that I have the right to withhold or withdraw my consent to the use of telemedicine in the course of my care at any time, without affecting my right to future care or treatment, and that the Practitioner or I may terminate the telemedicine visit at any time. I understand that I have the right to inspect all information obtained and/or recorded in the course of the telemedicine visit and may receive copies of available information for a reasonable fee.  I understand that some of the potential risks of receiving the Services via telemedicine include:  Marland Kitchen Delay or interruption in medical evaluation due to technological equipment failure or disruption; . Information transmitted may not be sufficient (e.g. poor resolution of images) to allow for appropriate medical decision making by the  Practitioner; and/or  . In rare instances, security protocols could fail, causing a breach of personal health information.  Furthermore, I acknowledge that it is my responsibility to provide information about my medical history, conditions and care that is complete and accurate to the best of my ability. I acknowledge that Practitioner's advice, recommendations, and/or decision may be based on factors not within their control, such as incomplete or inaccurate data provided by me or distortions of diagnostic images or specimens that may result from electronic transmissions. I understand that the practice of medicine is not an exact science and that Practitioner makes no warranties or guarantees regarding treatment outcomes. I acknowledge that a copy of this consent can be made available to me via my patient portal ([place]), or I can request a printed copy by calling the office of [facility].    I understand that my insurance will be billed for this visit.   I have read or had this consent read to me. . I understand the contents of this consent, which adequately explains the benefits and risks of the Services being provided via telemedicine.  . I have been provided ample opportunity to ask questions regarding this consent and the Services and have had my questions answered to my satisfaction. . I give my informed consent for the services to be provided through the use of telemedicine in my medical care

## 2019-09-17 NOTE — Telephone Encounter (Signed)
Pt has VV on 09/18/19 with Dr.Branch

## 2019-09-18 ENCOUNTER — Telehealth (INDEPENDENT_AMBULATORY_CARE_PROVIDER_SITE_OTHER): Payer: Medicare PPO | Admitting: Cardiology

## 2019-09-18 ENCOUNTER — Encounter: Payer: Self-pay | Admitting: Cardiology

## 2019-09-18 VITALS — Ht 65.0 in | Wt 107.0 lb

## 2019-09-18 DIAGNOSIS — R000 Tachycardia, unspecified: Secondary | ICD-10-CM

## 2019-09-18 DIAGNOSIS — I5022 Chronic systolic (congestive) heart failure: Secondary | ICD-10-CM | POA: Diagnosis not present

## 2019-09-18 DIAGNOSIS — R0602 Shortness of breath: Secondary | ICD-10-CM

## 2019-09-18 MED ORDER — ENTRESTO 24-26 MG PO TABS: 1.0000 | ORAL_TABLET | Freq: Two times a day (BID) | ORAL | 6 refills | Status: DC

## 2019-09-18 NOTE — Progress Notes (Signed)
Virtual Visit via Telephone Note   This visit type was conducted due to national recommendations for restrictions regarding the COVID-19 Pandemic (e.g. social distancing) in an effort to limit this patient's exposure and mitigate transmission in our community.  Due to her co-morbid illnesses, this patient is at least at moderate risk for complications without adequate follow up.  This format is felt to be most appropriate for this patient at this time.  The patient did not have access to video technology/had technical difficulties with video requiring transitioning to audio format only (telephone).  All issues noted in this document were discussed and addressed.  No physical exam could be performed with this format.  Please refer to the patient's chart for her  consent to telehealth for [facility].   The patient was identified using 2 identifiers.  Date:  09/18/2019   ID:  Patricia Nunez, DOB [birth year], MRN [id number]  Patient Location: Home Provider Location: Office  PCP:  Iona Beard, MD  Cardiologist:  Dr Carlyle Dolly MD Electrophysiologist:  None   Evaluation Performed:  Follow-Up Visit  Chief Complaint:  Follow up visit  History of Present Illness:    Patricia Nunez is a 64 y.o. female seen today for follow up of the following medical problems.   1. Tachycardia - can have some palpitations with high levels of activity but not at rest - reports his was an issue since her admission Jan 2021 with pneumona - CT PE at that time was negative.    2. 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 SOB/New diagnosis of systolic HF - prior admissoin in Jan with pneumonia - since that time ongoing SOB.   - DOE with walking up a flight of stairs which is new - +cough, +wheezng. Productive cough greenish/yellowish. Most often at night time.  - uses prn albuterol with some improvement - never smoker. No significant second hand smoke exposure.  - no chest  pains. No LE edema.  - home O2 low 90s  - since last visit she completed an echo - 08/2019 echo LVEF 30-35%, Normal RV function - ongoing symptoms, SOB is up and down. Working to increase her activity - no recent edema. No weight gain.    4. Breast cancer - s/p lumpectomy - has had radiation treatment earlier in January.  - currently on anastrazole  5. Hyperthyroid - followed by endo    SH:    The patient does not have symptoms concerning for COVID-19 infection (fever, chills, cough, or new shortness of breath).    Past Medical History:  Diagnosis Date  . Cancer ([place])   . Colon polyps   . Family history of breast cancer   . Family history of leukemia   . Hypertension   . PONV (postoperative nausea and vomiting)   . Thyroid disease    h/o hyperactive, just being followed by endocrinologist   Past Surgical History:  Procedure Laterality Date  . ABDOMINAL HYSTERECTOMY  2000  . BREAST LUMPECTOMY WITH RADIOACTIVE SEED AND SENTINEL LYMPH NODE BIOPSY Left 02/21/2019   Procedure: LEFT BREAST LUMPECTOMY WITH RADIOACTIVE SEED;  Surgeon: Patricia Luna, MD;  Location: [facility];  Service: General;  Laterality: Left;  . CATARACT EXTRACTION W/PHACO Left 03/10/2014   Procedure: CATARACT EXTRACTION PHACO AND INTRAOCULAR LENS PLACEMENT LEFT EYE;  Surgeon: Patricia Ethanjames Fontenot, MD;  [facility];  Service: Ophthalmology;  Laterality: Left;  CDE 3.50  . CATARACT EXTRACTION W/PHACO Right 04/14/2014  Procedure: CATARACT EXTRACTION PHACO AND INTRAOCULAR LENS PLACEMENT (IOC);  Surgeon: Patricia Vernetta Dizdarevic, MD;  [facility];  Service: Ophthalmology;  Laterality: Right;  CDE 3.35  . COLONOSCOPY  11/2008   Dr. Gala Nunez: normal. Next colonoscopy in 2015 due to h/o polyps by Dr. Tamala Nunez and path unknown  . COLONOSCOPY N/A 12/12/2014   Procedure: COLONOSCOPY;  Surgeon: Patricia Dolin, MD;  [facility];  Service: Endoscopy;  Laterality: N/A;  1230  . GANGLION CYST EXCISION Left    x2-wrist- [place]  .  SENTINEL NODE BIOPSY Left 02/21/2019   Procedure: Left Sentinel Lymph Node Mapping;  Surgeon: Patricia Luna, MD;  Location: [facility];  Service: General;  Laterality: Left;     Current Meds  Medication Sig  . albuterol (VENTOLIN HFA) 108 (90 Base) MCG/ACT inhaler Inhale 1 puff into the lungs every 4 (four) hours as needed for wheezing or shortness of breath.  Marland Kitchen alendronate (FOSAMAX) 70 MG tablet Take 70 mg by mouth once a week. Sundays  . amLODipine (NORVASC) 5 MG tablet Take 5 mg by mouth daily.  Marland Kitchen anastrozole (ARIMIDEX) 1 MG tablet Take 1 tablet (1 mg total) by mouth daily.  Marland Kitchen aspirin 81 MG tablet Take 81 mg by mouth daily.    Marland Kitchen b complex vitamins tablet Take 1 tablet by mouth daily.  . benzonatate (TESSALON) 100 MG capsule Take 100 mg by mouth 3 (three) times daily as needed for cough.  . Biotin 5 MG CAPS Take by mouth.  . calcium carbonate (OS-CAL) 600 MG TABS Take 600 mg by mouth daily with breakfast.   . cholecalciferol (VITAMIN D3) 25 MCG (1000 UT) tablet Take 1,000 Units by mouth daily.   Marland Kitchen loratadine (CLARITIN) 10 MG tablet Take 10 mg by mouth daily as needed for allergies.  . montelukast (SINGULAIR) 10 MG tablet Take 10 mg by mouth at bedtime.  . Multiple Vitamin (MULTIVITAMIN) capsule Take 1 capsule by mouth daily.    . pantoprazole (PROTONIX) 40 MG tablet Take 40 mg by mouth daily.  Marland Kitchen Specialty Vitamins Products (ECHINACEA C COMPLETE PO) Take by mouth.  . vitamin E 100 UNIT capsule Take 100 Units by mouth daily.    Marland Kitchen zinc gluconate 50 MG tablet Take 50 mg by mouth daily.     Allergies:   Hydromet [hydrocodone-homatropine]   Social History   Tobacco Use  . Smoking status: Never Smoker  . Smokeless tobacco: Never Used  . Tobacco comment: Never smoked  Substance Use Topics  . Alcohol use: No    Alcohol/week: 0.0 standard drinks  . Drug use: No     Family Hx: The patient's family history includes Breast cancer in her maternal grandmother, sister, and sister; Leukemia in  her maternal aunt; Stroke in her mother and sister. There is no history of Colon cancer.  ROS:   Please see the history of present illness.     All other systems reviewed and are negative.   Prior CV studies:   The following studies were reviewed today:  08/2019 echo IMPRESSIONS    1. Left ventricular ejection fraction, by estimation, is 30 to 35%. The  left ventricle has moderately decreased function. The left ventricle  demonstrates global hypokinesis with some regional variation. Left  ventricular diastolic parameters are  indeterminate. Patient appeared to be in sinus tachycardia throughout the  study.  2. Right ventricular systolic function is normal. The right ventricular  size is normal. There is normal pulmonary artery systolic pressure. The  estimated right ventricular systolic pressure is [id number] mmHg.  3. The mitral valve is grossly normal. Trivial mitral valve  regurgitation.  4. The aortic valve is tricuspid. Aortic valve regurgitation is not  visualized. Mild aortic valve sclerosis is present, with no evidence of  aortic valve stenosis.  5. The inferior vena cava is normal in size with greater than 50%  respiratory variability, suggesting right atrial pressure of 3 mmHg.   Labs/Other Tests and Data Reviewed:    EKG:  No ECG reviewed.  Recent Labs: 05/15/2019: ALT 19; Magnesium 2.0 05/16/2019: BUN 12; Creatinine, Ser 0.63; Hemoglobin 12.8; Platelets 299; Potassium 4.1; Sodium 141   Recent Lipid Panel No results found for: CHOL, TRIG, HDL, CHOLHDL, LDLCALC, LDLDIRECT  Wt Readings from Last 3 Encounters:  09/18/19 107 lb (48.5 kg)  08/26/19 107 lb (48.5 kg)  07/29/19 106 lb 12.8 oz (48.4 kg)     Objective:    Vital Signs:  Ht 5\' 5"  (1.651 m)   Wt 107 lb (48.5 kg)   BMI 17.81 kg/m    Normal affect. Normal speech pattern and tone. Comfortable, no apparent distress. No audible signs of sob or wheezing.   ASSESSMENT & PLAN:    1.Tachycardia -  initially seen as new consult for unexplained tachycardia and SOB.  - unclear etiology, on going several months - CT PE in Jan negative. Has not been anemic, she reports recent normal TSH with endocrinologist we will request results, no signs of hypovolemia or active pain.  - with recent echo findings concern could be related to her HF. Avoiding beta blocker until we know her CO/CI givern her tachcyardia. Request again her labs from her endocrinologist, she reports a recent normal TSH  2. SOB/New diagnosis of systolic HF - resting O2 sat 92%, she has nighttime hypoxia and is on O2 at night time - reports symptosm ongoign since pneumonia in January - recent echo shows LVEF 30-35%  - we will plan for LHC/RHC - stop norvasc, start entresto 24/26mg  bid. Given her tachycardia holding starting beta blocker until we know her cardiac output from heart cath  - she is to contact us once she knows what day works for her to schedule LHC/RHC   I have reviewed the risks, indications, and alternatives to cardiac catheterization, possible angioplasty, and stenting with the patient  today. Risks include but are not limited to bleeding, infection, vascular injury, stroke, myocardial infection, arrhythmia, kidney injury, radiation-related injury in the case of prolonged fluoroscopy use, emergency cardiac surgery, and death. The patient understands the risks of serious complication is 1-2 in [id number] with diagnostic cardiac cath and 1-2% or less with angioplasty/stenting.   COVID-19 Education: The signs and symptoms of COVID-19 were discussed with the patient and how to seek care for testing (follow up with PCP or arrange E-visit).  The importance of social distancing was discussed today.  Time:   Today, I have spent 15 minutes with the patient with telehealth technology discussing the above problems.     Medication Adjustments/Labs and Tests Ordered: Current medicines are reviewed at length with the patient  today.  Concerns regarding medicines are outlined above.   Tests Ordered: No orders of the defined types were placed in this encounter.   Medication Changes: No orders of the defined types were placed in this encounter.   Follow Up:  In Person in 3 week(s)  Signed, Carlyle Dolly, MD  09/18/2019 2:07 PM    [place]

## 2019-09-18 NOTE — H&P (View-Only) (Signed)
Virtual Visit via Telephone Note   This visit type was conducted due to national recommendations for restrictions regarding the COVID-19 Pandemic (e.g. social distancing) in an effort to limit this patient's exposure and mitigate transmission in our community.  Due to her co-morbid illnesses, this patient is at least at moderate risk for complications without adequate follow up.  This format is felt to be most appropriate for this patient at this time.  The patient did not have access to video technology/had technical difficulties with video requiring transitioning to audio format only (telephone).  All issues noted in this document were discussed and addressed.  No physical exam could be performed with this format.  Please refer to the patient's chart for her  consent to telehealth for [employer].   The patient was identified using 2 identifiers.  Date:  09/18/2019   ID:  Patricia Nunez, DOB [birth year], MRN [id number]  Patient Location: Home Provider Location: Office  PCP:  Iona Beard, MD  Cardiologist:  Dr Carlyle Dolly MD Electrophysiologist:  None   Evaluation Performed:  Follow-Up Visit  Chief Complaint:  Follow up visit  History of Present Illness:    Patricia Nunez is a 64 y.o. female seen today for follow up of the following medical problems.   1. Tachycardia - can have some palpitations with high levels of activity but not at rest - reports his was an issue since her admission Jan 2021 with pneumona - CT PE at that time was negative.    2. 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 SOB/New diagnosis of systolic HF - prior admissoin in Jan with pneumonia - since that time ongoing SOB.   - DOE with walking up a flight of stairs which is new - +cough, +wheezng. Productive cough greenish/yellowish. Most often at night time.  - uses prn albuterol with some improvement - never smoker. No significant second hand smoke exposure.  - no chest  pains. No LE edema.  - home O2 low 90s  - since last visit she completed an echo - 08/2019 echo LVEF 30-35%, Normal RV function - ongoing symptoms, SOB is up and down. Working to increase her activity - no recent edema. No weight gain.    4. Breast cancer - s/p lumpectomy - has had radiation treatment earlier in January.  - currently on anastrazole  5. Hyperthyroid - followed by endo    SH:    The patient does not have symptoms concerning for COVID-19 infection (fever, chills, cough, or new shortness of breath).    Past Medical History:  Diagnosis Date  . Cancer ([place])   . Colon polyps   . Family history of breast cancer   . Family history of leukemia   . Hypertension   . PONV (postoperative nausea and vomiting)   . Thyroid disease    h/o hyperactive, just being followed by endocrinologist   Past Surgical History:  Procedure Laterality Date  . ABDOMINAL HYSTERECTOMY  2000  . BREAST LUMPECTOMY WITH RADIOACTIVE SEED AND SENTINEL LYMPH NODE BIOPSY Left 02/21/2019   Procedure: LEFT BREAST LUMPECTOMY WITH RADIOACTIVE SEED;  Surgeon: Erroll Luna, MD;  Location: [facility];  Service: General;  Laterality: Left;  . CATARACT EXTRACTION W/PHACO Left 03/10/2014   Procedure: CATARACT EXTRACTION PHACO AND INTRAOCULAR LENS PLACEMENT LEFT EYE;  Surgeon: Tonny Daianna Vasques, MD;  Location: [facility];  Service: Ophthalmology;  Laterality: Left;  CDE 3.50  . CATARACT EXTRACTION W/PHACO Right 04/14/2014  Procedure: CATARACT EXTRACTION PHACO AND INTRAOCULAR LENS PLACEMENT (IOC);  Surgeon: Tonny Erlean Mealor, MD;  Location: [facility];  Service: Ophthalmology;  Laterality: Right;  CDE 3.35  . COLONOSCOPY  11/2008   Dr. Gala Romney: normal. Next colonoscopy in 2015 due to h/o polyps by Dr. Tamala Julian and path unknown  . COLONOSCOPY N/A 12/12/2014   Procedure: COLONOSCOPY;  Surgeon: Daneil Dolin, MD;  [facility];  Service: Endoscopy;  Laterality: N/A;  1230  . GANGLION CYST EXCISION Left    x2-wrist- [place]  .  SENTINEL NODE BIOPSY Left 02/21/2019   Procedure: Left Sentinel Lymph Node Mapping;  Surgeon: Erroll Luna, MD;  Location: [facility];  Service: General;  Laterality: Left;     Current Meds  Medication Sig  . albuterol (VENTOLIN HFA) 108 (90 Base) MCG/ACT inhaler Inhale 1 puff into the lungs every 4 (four) hours as needed for wheezing or shortness of breath.  Marland Kitchen alendronate (FOSAMAX) 70 MG tablet Take 70 mg by mouth once a week. Sundays  . amLODipine (NORVASC) 5 MG tablet Take 5 mg by mouth daily.  Marland Kitchen anastrozole (ARIMIDEX) 1 MG tablet Take 1 tablet (1 mg total) by mouth daily.  Marland Kitchen aspirin 81 MG tablet Take 81 mg by mouth daily.    Marland Kitchen b complex vitamins tablet Take 1 tablet by mouth daily.  . benzonatate (TESSALON) 100 MG capsule Take 100 mg by mouth 3 (three) times daily as needed for cough.  . Biotin 5 MG CAPS Take by mouth.  . calcium carbonate (OS-CAL) 600 MG TABS Take 600 mg by mouth daily with breakfast.   . cholecalciferol (VITAMIN D3) 25 MCG (1000 UT) tablet Take 1,000 Units by mouth daily.   Marland Kitchen loratadine (CLARITIN) 10 MG tablet Take 10 mg by mouth daily as needed for allergies.  . montelukast (SINGULAIR) 10 MG tablet Take 10 mg by mouth at bedtime.  . Multiple Vitamin (MULTIVITAMIN) capsule Take 1 capsule by mouth daily.    . pantoprazole (PROTONIX) 40 MG tablet Take 40 mg by mouth daily.  Marland Kitchen Specialty Vitamins Products (ECHINACEA C COMPLETE PO) Take by mouth.  . vitamin E 100 UNIT capsule Take 100 Units by mouth daily.    Marland Kitchen zinc gluconate 50 MG tablet Take 50 mg by mouth daily.     Allergies:   Hydromet [hydrocodone-homatropine]   Social History   Tobacco Use  . Smoking status: Never Smoker  . Smokeless tobacco: Never Used  . Tobacco comment: Never smoked  Substance Use Topics  . Alcohol use: No    Alcohol/week: 0.0 standard drinks  . Drug use: No     Family Hx: The patient's family history includes Breast cancer in her maternal grandmother, sister, and sister; Leukemia in  her maternal aunt; Stroke in her mother and sister. There is no history of Colon cancer.  ROS:   Please see the history of present illness.     All other systems reviewed and are negative.   Prior CV studies:   The following studies were reviewed today:  08/2019 echo IMPRESSIONS    1. Left ventricular ejection fraction, by estimation, is 30 to 35%. The  left ventricle has moderately decreased function. The left ventricle  demonstrates global hypokinesis with some regional variation. Left  ventricular diastolic parameters are  indeterminate. Patient appeared to be in sinus tachycardia throughout the  study.  2. Right ventricular systolic function is normal. The right ventricular  size is normal. There is normal pulmonary artery systolic pressure. The  estimated right ventricular systolic pressure is [id number] mmHg.  3. The mitral valve is grossly normal. Trivial mitral valve  regurgitation.  4. The aortic valve is tricuspid. Aortic valve regurgitation is not  visualized. Mild aortic valve sclerosis is present, with no evidence of  aortic valve stenosis.  5. The inferior vena cava is normal in size with greater than 50%  respiratory variability, suggesting right atrial pressure of 3 mmHg.   Labs/Other Tests and Data Reviewed:    EKG:  No ECG reviewed.  Recent Labs: 05/15/2019: ALT 19; Magnesium 2.0 05/16/2019: BUN 12; Creatinine, Ser 0.63; Hemoglobin 12.8; Platelets 299; Potassium 4.1; Sodium 141   Recent Lipid Panel No results found for: CHOL, TRIG, HDL, CHOLHDL, LDLCALC, LDLDIRECT  Wt Readings from Last 3 Encounters:  09/18/19 107 lb (48.5 kg)  08/26/19 107 lb (48.5 kg)  07/29/19 106 lb 12.8 oz (48.4 kg)     Objective:    Vital Signs:  Ht 5\' 5"  (1.651 m)   Wt 107 lb (48.5 kg)   BMI 17.81 kg/m    Normal affect. Normal speech pattern and tone. Comfortable, no apparent distress. No audible signs of sob or wheezing.   ASSESSMENT & PLAN:    1.Tachycardia -  initially seen as new consult for unexplained tachycardia and SOB.  - unclear etiology, on going several months - CT PE in Jan negative. Has not been anemic, she reports recent normal TSH with endocrinologist we will request results, no signs of hypovolemia or active pain.  - with recent echo findings concern could be related to her HF. Avoiding beta blocker until we know her CO/CI givern her tachcyardia. Request again her labs from her endocrinologist, she reports a recent normal TSH  2. SOB/New diagnosis of systolic HF - resting O2 sat 92%, she has nighttime hypoxia and is on O2 at night time - reports symptosm ongoign since pneumonia in January - recent echo shows LVEF 30-35%  - we will plan for LHC/RHC - stop norvasc, start entresto 24/26mg  bid. Given her tachycardia holding starting beta blocker until we know her cardiac output from heart cath  - she is to contact us once she knows what day works for her to schedule LHC/RHC   I have reviewed the risks, indications, and alternatives to cardiac catheterization, possible angioplasty, and stenting with the patient  today. Risks include but are not limited to bleeding, infection, vascular injury, stroke, myocardial infection, arrhythmia, kidney injury, radiation-related injury in the case of prolonged fluoroscopy use, emergency cardiac surgery, and death. The patient understands the risks of serious complication is 1-2 in [id number] with diagnostic cardiac cath and 1-2% or less with angioplasty/stenting.   COVID-19 Education: The signs and symptoms of COVID-19 were discussed with the patient and how to seek care for testing (follow up with PCP or arrange E-visit).  The importance of social distancing was discussed today.  Time:   Today, I have spent 15 minutes with the patient with telehealth technology discussing the above problems.     Medication Adjustments/Labs and Tests Ordered: Current medicines are reviewed at length with the patient  today.  Concerns regarding medicines are outlined above.   Tests Ordered: No orders of the defined types were placed in this encounter.   Medication Changes: No orders of the defined types were placed in this encounter.   Follow Up:  In Person in 3 week(s)  Signed, Carlyle Dolly, MD  09/18/2019 2:07 PM    [place]

## 2019-09-18 NOTE — Patient Instructions (Signed)
Medication Instructions:  STOP Norvasc   START Entresto 24/26 mg twidce a day   *If you need a refill on your cardiac medications before your next appointment, please call your pharmacy*   Lab Work: None today If you have labs (blood work) drawn today and your tests are completely normal, you will receive your results only by: Marland Kitchen MyChart Message (if you have MyChart) OR . A paper copy in the mail If you have any lab test that is abnormal or we need to change your treatment, we will call you to review the results.  Your physician has requested that you have a cardiac catheterization. Cardiac catheterization is used to diagnose and/or treat various heart conditions. Doctors may recommend this procedure for a number of different reasons. The most common reason is to evaluate chest pain. Chest pain can be a symptom of coronary artery disease (CAD), and cardiac catheterization can show whether plaque is narrowing or blocking your heart's arteries. This procedure is also used to evaluate the valves, as well as measure the blood flow and oxygen levels in different parts of your heart. For further information please visit [email or web address].[email or web address]. Please follow instruction sheet, as given. Please CALL OUR OFFICE WHEN YOU WANT TO SCHEDULE AFTER CHECKING YOUR CALANDER.    We recommend signing up for the patient portal called "MyChart".  Sign up information is provided on this After Visit Summary.  MyChart is used to connect with patients for Virtual Visits (Telemedicine).  Patients are able to view lab/test results, encounter notes, upcoming appointments, etc.  Non-urgent messages can be sent to your provider as well.   To learn more about what you can do with MyChart, go to [email or web address].    Your next appointment:   3 week(s)  The format for your next appointment:   In Person  Provider:   You may see Dr.Branch or one of the following Advanced Practice Providers on your designated Care Team:     [place], PA-C   Ermalinda Barrios, PA-C     Other Instructions None

## 2019-09-20 ENCOUNTER — Telehealth: Payer: Self-pay

## 2019-09-20 DIAGNOSIS — Z01818 Encounter for other preprocedural examination: Secondary | ICD-10-CM

## 2019-09-20 DIAGNOSIS — I5022 Chronic systolic (congestive) heart failure: Secondary | ICD-10-CM

## 2019-09-20 NOTE — Telephone Encounter (Signed)
Pt is ready to schedule CATH.   Please call [phone]  Thanks renee

## 2019-09-20 NOTE — Telephone Encounter (Signed)
Cath scheduled for 10/11/19 730 am with Dr.Arida   Pt will have labs done at [facility], and COVID done on 6/23 at [facility]    I will message Dr.Branch

## 2019-09-23 ENCOUNTER — Other Ambulatory Visit: Payer: Self-pay | Admitting: Cardiology

## 2019-09-23 DIAGNOSIS — I5022 Chronic systolic (congestive) heart failure: Secondary | ICD-10-CM

## 2019-09-23 MED ORDER — SODIUM CHLORIDE 0.9% FLUSH: 3.0000 mL | Freq: Two times a day (BID) | INTRAVENOUS | Status: DC

## 2019-09-23 NOTE — Telephone Encounter (Signed)
Thx of update, orders placed   J Minaal Struckman MD

## 2019-10-06 DIAGNOSIS — R0902 Hypoxemia: Secondary | ICD-10-CM | POA: Diagnosis not present

## 2019-10-06 DIAGNOSIS — J449 Chronic obstructive pulmonary disease, unspecified: Secondary | ICD-10-CM | POA: Diagnosis not present

## 2019-10-07 ENCOUNTER — Ambulatory Visit: Payer: Medicare PPO | Admitting: Physician Assistant

## 2019-10-08 ENCOUNTER — Other Ambulatory Visit: Payer: Self-pay

## 2019-10-08 ENCOUNTER — Other Ambulatory Visit (HOSPITAL_COMMUNITY)
Admission: RE | Admit: 2019-10-08 | Discharge: 2019-10-08 | Disposition: A | Discharge status: Home / Self Care | Payer: Medicare PPO | Source: Ambulatory Visit | Attending: Cardiology | Admitting: Cardiology

## 2019-10-08 DIAGNOSIS — I5022 Chronic systolic (congestive) heart failure: Secondary | ICD-10-CM | POA: Insufficient documentation

## 2019-10-08 DIAGNOSIS — Z01818 Encounter for other preprocedural examination: Secondary | ICD-10-CM | POA: Diagnosis not present

## 2019-10-08 LAB — CBC WITH DIFFERENTIAL/PLATELET
Abs Immature Granulocytes: 0.01 10*3/uL (ref 0.00–0.07)
Basophils Absolute: 0.1 10*3/uL (ref 0.0–0.1)
Basophils Relative: 2 %
Eosinophils Absolute: 1.5 10*3/uL — ABNORMAL HIGH (ref 0.0–0.5)
Eosinophils Relative: 23 %
HCT: 43 % (ref 36.0–46.0)
Hemoglobin: 13.6 g/dL (ref 12.0–15.0)
Immature Granulocytes: 0 %
Lymphocytes Relative: 25 %
Lymphs Abs: 1.6 10*3/uL (ref 0.7–4.0)
MCH: 29.5 pg (ref 26.0–34.0)
MCHC: 31.6 g/dL (ref 30.0–36.0)
MCV: 93.3 fL (ref 80.0–100.0)
Monocytes Absolute: 0.7 10*3/uL (ref 0.1–1.0)
Monocytes Relative: 10 %
Neutro Abs: 2.6 10*3/uL (ref 1.7–7.7)
Neutrophils Relative %: 40 %
Platelets: 261 10*3/uL (ref 150–400)
RBC: 4.61 MIL/uL (ref 3.87–5.11)
RDW: 13.2 % (ref 11.5–15.5)
WBC: 6.5 10*3/uL (ref 4.0–10.5)
nRBC: 0 % (ref 0.0–0.2)

## 2019-10-08 LAB — BASIC METABOLIC PANEL
Anion gap: 10 (ref 5–15)
BUN: 15 mg/dL (ref 8–23)
CO2: 27 mmol/L (ref 22–32)
Calcium: 9.3 mg/dL (ref 8.9–10.3)
Chloride: 102 mmol/L (ref 98–111)
Creatinine, Ser: 0.74 mg/dL (ref 0.44–1.00)
GFR calc Af Amer: >60 mL/min (ref >60)
GFR calc non Af Amer: >60 mL/min (ref >60)
Glucose, Bld: 120 mg/dL — ABNORMAL HIGH (ref 70–99)
Potassium: 3.7 mmol/L (ref 3.5–5.1)
Sodium: 139 mmol/L (ref 135–145)

## 2019-10-09 ENCOUNTER — Other Ambulatory Visit (HOSPITAL_COMMUNITY): Payer: Medicare PPO

## 2019-10-10 ENCOUNTER — Telehealth: Payer: Self-pay | Admitting: *Deleted

## 2019-10-10 NOTE — Telephone Encounter (Signed)
Pt contacted pre-catheterization scheduled at [facility] for: Friday October 11, 2019 7:30 AM Verified arrival time and place: [facility]) at: 5:30 AM   No solid food after midnight prior to cath, clear liquids until 5 AM day of procedure.   AM meds can be  taken pre-cath with sips of water including: ASA 81 mg   Confirmed patient has responsible adult to drive home post procedure and observe 24 hours after arriving home: yes  You are allowed ONE visitor in the waiting room during your procedure. Both you and your visitor must wear masks.      COVID-19 Pre-Screening Questions:  . In the past 7 to 10 days have you had a new cough, shortness of breath, headache, congestion, fever (100 or greater) unexplained body aches, new sore throat, or sudden loss of taste or sense of smell? no . In the past 7 to 10 days have you been around anyone with known Covid 19? no   Reviewed procedure/mask/visitor instructions, COVID-19 screening questions with patient.

## 2019-10-11 ENCOUNTER — Ambulatory Visit (HOSPITAL_COMMUNITY)
Admission: RE | Admit: 2019-10-11 | Discharge: 2019-10-11 | Disposition: A | Discharge status: Home / Self Care | Payer: Medicare PPO | Attending: Cardiovascular Disease | Admitting: Cardiovascular Disease

## 2019-10-11 ENCOUNTER — Encounter (HOSPITAL_COMMUNITY)
Admission: RE | Disposition: A | Discharge status: Home / Self Care | Payer: Self-pay | Source: Home / Self Care | Attending: Cardiovascular Disease

## 2019-10-11 ENCOUNTER — Other Ambulatory Visit: Payer: Self-pay

## 2019-10-11 ENCOUNTER — Encounter (HOSPITAL_COMMUNITY): Payer: Self-pay | Admitting: Cardiovascular Disease

## 2019-10-11 DIAGNOSIS — I5022 Chronic systolic (congestive) heart failure: Secondary | ICD-10-CM | POA: Diagnosis not present

## 2019-10-11 DIAGNOSIS — Z79899 Other long term (current) drug therapy: Secondary | ICD-10-CM | POA: Insufficient documentation

## 2019-10-11 DIAGNOSIS — R0902 Hypoxemia: Secondary | ICD-10-CM | POA: Diagnosis not present

## 2019-10-11 DIAGNOSIS — E0590 Thyrotoxicosis, unspecified without thyrotoxic crisis or storm: Secondary | ICD-10-CM | POA: Diagnosis not present

## 2019-10-11 DIAGNOSIS — R000 Tachycardia, unspecified: Secondary | ICD-10-CM | POA: Insufficient documentation

## 2019-10-11 DIAGNOSIS — Z923 Personal history of irradiation: Secondary | ICD-10-CM | POA: Insufficient documentation

## 2019-10-11 DIAGNOSIS — I110 Hypertensive heart disease with heart failure: Secondary | ICD-10-CM | POA: Diagnosis not present

## 2019-10-11 DIAGNOSIS — Z7982 Long term (current) use of aspirin: Secondary | ICD-10-CM | POA: Diagnosis not present

## 2019-10-11 DIAGNOSIS — Z853 Personal history of malignant neoplasm of breast: Secondary | ICD-10-CM | POA: Insufficient documentation

## 2019-10-11 DIAGNOSIS — I428 Other cardiomyopathies: Secondary | ICD-10-CM | POA: Insufficient documentation

## 2019-10-11 HISTORY — PX: RIGHT/LEFT HEART CATH AND CORONARY ANGIOGRAPHY: CATH118266

## 2019-10-11 LAB — POCT I-STAT EG7
Acid-Base Excess: 0 mmol/L (ref 0.0–2.0)
Acid-Base Excess: 0 mmol/L (ref 0.0–2.0)
Bicarbonate: 26.1 mmol/L (ref 20.0–28.0)
Bicarbonate: 26.3 mmol/L (ref 20.0–28.0)
Calcium, Ion: 1.26 mmol/L (ref 1.15–1.40)
Calcium, Ion: 1.28 mmol/L (ref 1.15–1.40)
HCT: 40 % (ref 36.0–46.0)
HCT: 40 % (ref 36.0–46.0)
Hemoglobin: 13.6 g/dL (ref 12.0–15.0)
Hemoglobin: 13.6 g/dL (ref 12.0–15.0)
O2 Saturation: 78 %
O2 Saturation: 81 %
Potassium: 3.6 mmol/L (ref 3.5–5.1)
Potassium: 3.7 mmol/L (ref 3.5–5.1)
Sodium: 141 mmol/L (ref 135–145)
Sodium: 142 mmol/L (ref 135–145)
TCO2: 28 mmol/L (ref 22–32)
TCO2: 28 mmol/L (ref 22–32)
pCO2, Ven: 47.8 mmHg (ref 44.0–60.0)
pCO2, Ven: 48.4 mmHg (ref 44.0–60.0)
pH, Ven: 7.342 (ref 7.250–7.430)
pH, Ven: 7.346 (ref 7.250–7.430)
pO2, Ven: 46 mmHg — ABNORMAL HIGH (ref 32.0–45.0)
pO2, Ven: 49 mmHg — ABNORMAL HIGH (ref 32.0–45.0)

## 2019-10-11 LAB — POCT I-STAT 7, (LYTES, BLD GAS, ICA,H+H)
Acid-Base Excess: 0 mmol/L (ref 0.0–2.0)
Bicarbonate: 25.6 mmol/L (ref 20.0–28.0)
Calcium, Ion: 1.23 mmol/L (ref 1.15–1.40)
HCT: 39 % (ref 36.0–46.0)
Hemoglobin: 13.3 g/dL (ref 12.0–15.0)
O2 Saturation: 99 %
Potassium: 3.6 mmol/L (ref 3.5–5.1)
Sodium: 142 mmol/L (ref 135–145)
TCO2: 27 mmol/L (ref 22–32)
pCO2 arterial: 44.4 mmHg (ref 32.0–48.0)
pH, Arterial: 7.368 (ref 7.350–7.450)
pO2, Arterial: 170 mmHg — ABNORMAL HIGH (ref 83.0–108.0)

## 2019-10-11 SURGERY — RIGHT/LEFT HEART CATH AND CORONARY ANGIOGRAPHY
Anesthesia: LOCAL

## 2019-10-11 MED ORDER — SODIUM CHLORIDE 0.9 % IV SOLN: 250.0000 mL | INTRAVENOUS | Status: DC | PRN

## 2019-10-11 MED ORDER — SODIUM CHLORIDE 0.9% FLUSH: 3.0000 mL | INTRAVENOUS | Status: DC | PRN

## 2019-10-11 MED ORDER — MIDAZOLAM HCL 2 MG/2ML IJ SOLN
INTRAMUSCULAR | Status: DC | PRN
  Administered 2019-10-11: 1 mg via INTRAVENOUS

## 2019-10-11 MED ORDER — VERAPAMIL HCL 2.5 MG/ML IV SOLN
INTRAVENOUS | Status: AC
  Filled 2019-10-11: qty 2

## 2019-10-11 MED ORDER — MIDAZOLAM HCL 2 MG/2ML IJ SOLN
INTRAMUSCULAR | Status: AC
  Filled 2019-10-11: qty 2

## 2019-10-11 MED ORDER — HEPARIN (PORCINE) IN NACL 1000-0.9 UT/500ML-% IV SOLN
INTRAVENOUS | Status: AC
  Filled 2019-10-11: qty 1000

## 2019-10-11 MED ORDER — LIDOCAINE HCL (PF) 1 % IJ SOLN
INTRAMUSCULAR | Status: AC
  Filled 2019-10-11: qty 30

## 2019-10-11 MED ORDER — SODIUM CHLORIDE 0.9% FLUSH: 3.0000 mL | Freq: Two times a day (BID) | INTRAVENOUS | Status: DC

## 2019-10-11 MED ORDER — FENTANYL CITRATE (PF) 100 MCG/2ML IJ SOLN
INTRAMUSCULAR | Status: DC | PRN
  Administered 2019-10-11: 25 ug via INTRAVENOUS

## 2019-10-11 MED ORDER — SODIUM CHLORIDE 0.9 % IV SOLN: INTRAVENOUS | Status: DC

## 2019-10-11 MED ORDER — FENTANYL CITRATE (PF) 100 MCG/2ML IJ SOLN
INTRAMUSCULAR | Status: AC
  Filled 2019-10-11: qty 2

## 2019-10-11 MED ORDER — LIDOCAINE HCL (PF) 1 % IJ SOLN
INTRAMUSCULAR | Status: DC | PRN
  Administered 2019-10-11: 5 mL

## 2019-10-11 MED ORDER — IOHEXOL 350 MG/ML SOLN
INTRAVENOUS | Status: DC | PRN
  Administered 2019-10-11: 55 mL

## 2019-10-11 MED ORDER — HEPARIN SODIUM (PORCINE) 1000 UNIT/ML IJ SOLN
INTRAMUSCULAR | Status: DC | PRN
  Administered 2019-10-11: 2500 [IU] via INTRAVENOUS

## 2019-10-11 MED ORDER — VERAPAMIL HCL 2.5 MG/ML IV SOLN
INTRAVENOUS | Status: DC | PRN
  Administered 2019-10-11: 10 mL via INTRA_ARTERIAL

## 2019-10-11 MED ORDER — HEPARIN (PORCINE) IN NACL 1000-0.9 UT/500ML-% IV SOLN
INTRAVENOUS | Status: DC | PRN
  Administered 2019-10-11 (×2): 500 mL

## 2019-10-11 MED ORDER — HEPARIN SODIUM (PORCINE) 1000 UNIT/ML IJ SOLN
INTRAMUSCULAR | Status: AC
  Filled 2019-10-11: qty 1

## 2019-10-11 MED ORDER — ASPIRIN 81 MG PO CHEW: 81.0000 mg | CHEWABLE_TABLET | ORAL | Status: DC

## 2019-10-11 SURGICAL SUPPLY — 12 items
CATH INFINITI 5FR JK (CATHETERS) ×1 IMPLANT
CATH SWAN DBL LUMAN 5F 110 (CATHETERS) ×1 IMPLANT
DEVICE RAD COMP TR BAND LRG (VASCULAR PRODUCTS) ×1 IMPLANT
GLIDESHEATH SLEND SS 6F .021 (SHEATH) ×2 IMPLANT
GUIDEWIRE .025 260CM (WIRE) ×1 IMPLANT
GUIDEWIRE INQWIRE 1.5J.035X260 (WIRE) IMPLANT
INQWIRE 1.5J .035X260CM (WIRE) ×2
KIT HEART LEFT (KITS) ×2 IMPLANT
PACK CARDIAC CATHETERIZATION (CUSTOM PROCEDURE TRAY) ×2 IMPLANT
SYR CONTROL 10ML ANGIOGRAPHIC (SYRINGE) ×1 IMPLANT
TRANSDUCER W/STOPCOCK (MISCELLANEOUS) ×2 IMPLANT
TUBING CIL FLEX 10 FLL-RA (TUBING) ×2 IMPLANT

## 2019-10-11 NOTE — Progress Notes (Signed)
Pt ambulated to bathroom, standby assist. Voided and ambulated without difficulty. No new bleeding noted.

## 2019-10-11 NOTE — Discharge Instructions (Signed)
Radial Site Care  This sheet gives you information about how to care for yourself after your procedure. Your health care provider may also give you more specific instructions. If you have problems or questions, contact your health care provider. What can I expect after the procedure? After the procedure, it is common to have:  Bruising and tenderness at the catheter insertion area. Follow these instructions at home: Medicines  Take over-the-counter and prescription medicines only as told by your health care provider. Insertion site care  Follow instructions from your health care provider about how to take care of your insertion site. Make sure you: ? Wash your hands with soap and water before you change your bandage (dressing). If soap and water are not available, use hand sanitizer. ? Change your dressing as told by your health care provider. ? Leave stitches (sutures), skin glue, or adhesive strips in place. These skin closures may need to stay in place for 2 weeks or longer. If adhesive strip edges start to loosen and curl up, you may trim the loose edges. Do not remove adhesive strips completely unless your health care provider tells you to do that.  Check your insertion site every day for signs of infection. Check for: ? Redness, swelling, or pain. ? Fluid or blood. ? Pus or a bad smell. ? Warmth.  Do not take baths, swim, or use a hot tub until your health care provider approves.  You may shower 24-48 hours after the procedure, or as directed by your health care provider. ? Remove the dressing and gently wash the site with plain soap and water. ? Pat the area dry with a clean towel. ? Do not rub the site. That could cause bleeding.  Do not apply powder or lotion to the site. Activity   For 24 hours after the procedure, or as directed by your health care provider: ? Do not flex or bend the affected arm. ? Do not push or pull heavy objects with the affected arm. ? Do not  drive yourself home from the hospital or clinic. You may drive 24 hours after the procedure unless your health care provider tells you not to. ? Do not operate machinery or power tools.  Do not lift anything that is heavier than 10 lb (4.5 kg), or the limit that you are told, until your health care provider says that it is safe.  Ask your health care provider when it is okay to: ? Return to work or school. ? Resume usual physical activities or sports. ? Resume sexual activity. General instructions  If the catheter site starts to bleed, raise your arm and put firm pressure on the site. If the bleeding does not stop, get help right away. This is a medical emergency.  If you went home on the same day as your procedure, a responsible adult should be with you for the first 24 hours after you arrive home.  Keep all follow-up visits as told by your health care provider. This is important. Contact a health care provider if:  You have a fever.  You have redness, swelling, or yellow drainage around your insertion site. Get help right away if:  You have unusual pain at the radial site.  The catheter insertion area swells very fast.  The insertion area is bleeding, and the bleeding does not stop when you hold steady pressure on the area.  Your arm or hand becomes pale, cool, tingly, or numb. These symptoms may represent a serious problem   that is an emergency. Do not wait to see if the symptoms will go away. Get medical help right away. Call your local emergency services (911 in the [place] Do not drive yourself to the hospital. Summary  After the procedure, it is common to have bruising and tenderness at the site.  Follow instructions from your health care provider about how to take care of your radial site wound. Check the wound every day for signs of infection.  Do not lift anything that is heavier than 10 lb (4.5 kg), or the limit that you are told, until your health care provider says  that it is safe. This information is not intended to replace advice given to you by your health care provider. Make sure you discuss any questions you have with your health care provider. Document Revised: 05/10/2017 Document Reviewed: 05/10/2017 Elsevier Patient Education  2020 [employer]  

## 2019-10-11 NOTE — Interval H&P Note (Signed)
History and Physical Interval Note:  10/11/2019 8:00 AM  Patricia Nunez  has presented today for surgery, with the diagnosis of heart failure.  The various methods of treatment have been discussed with the patient and family. After consideration of risks, benefits and other options for treatment, the patient has consented to  Procedure(s): RIGHT/LEFT HEART CATH AND CORONARY ANGIOGRAPHY (N/A) as a surgical intervention.  The patient's history has been reviewed, patient examined, no change in status, stable for surgery.  I have reviewed the patient's chart and labs.  Questions were answered to the patient's satisfaction.     Kathlyn [place]

## 2019-10-15 DIAGNOSIS — Z01419 Encounter for gynecological examination (general) (routine) without abnormal findings: Secondary | ICD-10-CM | POA: Diagnosis not present

## 2019-10-25 ENCOUNTER — Other Ambulatory Visit: Payer: Self-pay

## 2019-10-25 ENCOUNTER — Ambulatory Visit
Admission: RE | Admit: 2019-10-25 | Discharge: 2019-10-25 | Disposition: A | Discharge status: Home / Self Care | Payer: Medicare PPO | Source: Ambulatory Visit | Attending: Adult Health | Admitting: Adult Health

## 2019-10-25 DIAGNOSIS — C50412 Malignant neoplasm of upper-outer quadrant of left female breast: Secondary | ICD-10-CM

## 2019-10-25 DIAGNOSIS — Z853 Personal history of malignant neoplasm of breast: Secondary | ICD-10-CM | POA: Diagnosis not present

## 2019-10-25 DIAGNOSIS — R922 Inconclusive mammogram: Secondary | ICD-10-CM | POA: Diagnosis not present

## 2019-10-25 HISTORY — DX: Personal history of irradiation: Z92.3

## 2019-10-29 ENCOUNTER — Encounter: Payer: Self-pay | Admitting: Student

## 2019-10-29 ENCOUNTER — Ambulatory Visit: Payer: Medicare PPO | Admitting: Student

## 2019-10-29 ENCOUNTER — Other Ambulatory Visit: Payer: Self-pay

## 2019-10-29 VITALS — BP 122/74 | HR 88 | Ht 62.0 in | Wt 108.6 lb

## 2019-10-29 DIAGNOSIS — K219 Gastro-esophageal reflux disease without esophagitis: Secondary | ICD-10-CM | POA: Diagnosis not present

## 2019-10-29 DIAGNOSIS — I10 Essential (primary) hypertension: Secondary | ICD-10-CM

## 2019-10-29 DIAGNOSIS — R000 Tachycardia, unspecified: Secondary | ICD-10-CM | POA: Diagnosis not present

## 2019-10-29 DIAGNOSIS — I428 Other cardiomyopathies: Secondary | ICD-10-CM | POA: Diagnosis not present

## 2019-10-29 DIAGNOSIS — Z803 Family history of malignant neoplasm of breast: Secondary | ICD-10-CM | POA: Diagnosis not present

## 2019-10-29 DIAGNOSIS — I5022 Chronic systolic (congestive) heart failure: Secondary | ICD-10-CM

## 2019-10-29 DIAGNOSIS — C50919 Malignant neoplasm of unspecified site of unspecified female breast: Secondary | ICD-10-CM | POA: Diagnosis not present

## 2019-10-29 DIAGNOSIS — Z79811 Long term (current) use of aromatase inhibitors: Secondary | ICD-10-CM | POA: Diagnosis not present

## 2019-10-29 DIAGNOSIS — J309 Allergic rhinitis, unspecified: Secondary | ICD-10-CM | POA: Diagnosis not present

## 2019-10-29 DIAGNOSIS — M810 Age-related osteoporosis without current pathological fracture: Secondary | ICD-10-CM | POA: Diagnosis not present

## 2019-10-29 DIAGNOSIS — Z7983 Long term (current) use of bisphosphonates: Secondary | ICD-10-CM | POA: Diagnosis not present

## 2019-10-29 DIAGNOSIS — R0602 Shortness of breath: Secondary | ICD-10-CM | POA: Diagnosis not present

## 2019-10-29 MED ORDER — METOPROLOL SUCCINATE ER 25 MG PO TB24
25.0000 mg | ORAL_TABLET | Freq: Every day | ORAL | 3 refills | Status: DC
Start: 2019-10-29 — End: 2019-12-31

## 2019-10-29 NOTE — Progress Notes (Signed)
Cardiology Office Note    Date:  10/29/2019   ID:  Patricia, Nunez 1955/06/29, MRN [id number]  PCP:  Iona Beard, MD  Cardiologist: Carlyle Dolly, MD    Chief Complaint  Patient presents with  . Follow-up    s/p cardiac catheterization    History of Present Illness:    Patricia Nunez is a 64 y.o. female with past medical history of newly diagnosed cardiomyopathy (EF 30-35% by echo in 08/2019), HTN, hyperthyroidism and history of breast cancer who presents to the office today for follow-up from her recent cardiac catheterization.  She was examined by Dr. Harl Bowie in 08/2019 as a new patient referral for tachycardia and worsening dyspnea. An echocardiogram was obtained for initial assessment and showed a reduced EF of 30 to 35% with global hypokinesis and normal RV function. Findings were reviewed with the patient in 09/2019 and a cardiac catheterization was recommended for definitive evaluation. Amlodipine was discontinued and she was switched to Entresto 24-26 mg twice daily. She was not started on a beta-blocker given her cardiac output was unknown. Her procedure was performed by Dr. Fletcher Anon on 10/11/2019 and showed normal coronary arteries and EF had improved to 40 to 45% as compared to her recent echocardiogram. Right and left-sided filling pressures were normal with normal cardiac output as well. Continued medical therapy was recommended for her nonischemic cardiomyopathy.  In talking with the patient today, she reports overall doing well since her recent cardiac catheterization. She reports she did have some discomfort along her radial site for a few days but this has now resolved. She denies any recent chest pain or palpitations. She still has dyspnea on exertion but says this has improved since being switched to [facility] at the time of her last visit. She is now able to walk to her mailbox. She denies any recent PND or lower extremity edema. She does experience a productive cough  at night and has been taking Mucinex and using her inhaler as previously recommended by her PCP.   Past Medical History:  Diagnosis Date  . Cancer ([place])   . Chronic systolic (congestive) heart failure (HCC)    a. EF 30-35% by echo in 08/2019 b. cath in 09/2019 showing normal cors and EF at 40-45%  . Colon polyps   . Family history of breast cancer   . Family history of leukemia   . Hypertension   . Personal history of radiation therapy    Dec 2020- Jan 2021  . PONV (postoperative nausea and vomiting)   . Thyroid disease    h/o hyperactive, just being followed by endocrinologist    Past Surgical History:  Procedure Laterality Date  . ABDOMINAL HYSTERECTOMY  2000  . BREAST LUMPECTOMY Left 2020  . BREAST LUMPECTOMY WITH RADIOACTIVE SEED AND SENTINEL LYMPH NODE BIOPSY Left 02/21/2019   Procedure: LEFT BREAST LUMPECTOMY WITH RADIOACTIVE SEED;  Surgeon: Erroll Luna, MD;  Location: [facility];  Service: General;  Laterality: Left;  . CATARACT EXTRACTION W/PHACO Left 03/10/2014   Procedure: CATARACT EXTRACTION PHACO AND INTRAOCULAR LENS PLACEMENT LEFT EYE;  Surgeon: Tonny Branch, MD;  Location: [facility];  Service: Ophthalmology;  Laterality: Left;  CDE 3.50  . CATARACT EXTRACTION W/PHACO Right 04/14/2014   Procedure: CATARACT EXTRACTION PHACO AND INTRAOCULAR LENS PLACEMENT (IOC);  Surgeon: Tonny Branch, MD;  Location: [facility];  Service: Ophthalmology;  Laterality: Right;  CDE 3.35  . COLONOSCOPY  11/2008   Dr. Gala Romney: normal. Next colonoscopy in 2015 due to h/o polyps  by Dr. Tamala Julian and path unknown  . COLONOSCOPY N/A 12/12/2014   Procedure: COLONOSCOPY;  Surgeon: Daneil Dolin, MD;  Location: [facility];  Service: Endoscopy;  Laterality: N/A;  1230  . GANGLION CYST EXCISION Left    x2-wrist- [place]  . RIGHT/LEFT HEART CATH AND CORONARY ANGIOGRAPHY N/A 10/11/2019   Procedure: RIGHT/LEFT HEART CATH AND CORONARY ANGIOGRAPHY;  Surgeon: Wellington Hampshire, MD;  Location: [facility];  Service:  Cardiovascular;  Laterality: N/A;  . SENTINEL NODE BIOPSY Left 02/21/2019   Procedure: Left Sentinel Lymph Node Mapping;  Surgeon: Erroll Luna, MD;  Location: [facility];  Service: General;  Laterality: Left;    Current Medications: Outpatient Medications Prior to Visit  Medication Sig Dispense Refill  . albuterol (VENTOLIN HFA) 108 (90 Base) MCG/ACT inhaler Inhale 1 puff into the lungs every 4 (four) hours as needed for wheezing or shortness of breath.    Marland Kitchen alendronate (FOSAMAX) 70 MG tablet Take 70 mg by mouth every Sunday.     Marland Kitchen anastrozole (ARIMIDEX) 1 MG tablet Take 1 tablet (1 mg total) by mouth daily. 90 tablet 3  . aspirin 81 MG tablet Take 81 mg by mouth daily.      Marland Kitchen b complex vitamins tablet Take 1 tablet by mouth daily.    . Biotin 5 MG CAPS Take 5 mg by mouth daily.     . calcium carbonate (OS-CAL) 600 MG TABS Take 600 mg by mouth daily with breakfast.     . cholecalciferol (VITAMIN D3) 25 MCG (1000 UT) tablet Take 1,000 Units by mouth daily.     Marland Kitchen loratadine (CLARITIN) 10 MG tablet Take 10 mg by mouth every other day.     . montelukast (SINGULAIR) 10 MG tablet Take 10 mg by mouth at bedtime.    . Multiple Vitamin (MULTIVITAMIN) capsule Take 1 capsule by mouth daily.      . OXYGEN Inhale 1.5-2 L into the lungs at bedtime.    . pantoprazole (PROTONIX) 40 MG tablet Take 40 mg by mouth daily.    . sacubitril-valsartan (ENTRESTO) 24-26 MG Take 1 tablet by mouth 2 (two) times daily. 60 tablet 6  . Specialty Vitamins Products (ECHINACEA C COMPLETE PO) Take 1 tablet by mouth daily.     . vitamin E 100 UNIT capsule Take 100 Units by mouth daily.      Marland Kitchen zinc gluconate 50 MG tablet Take 50 mg by mouth daily.     No facility-administered medications prior to visit.     Allergies:   Hydromet [hydrocodone-homatropine]   Social History   Socioeconomic History  . Marital status: Divorced    Spouse name: Not on file  . Number of children: 1  . Years of education: Not on file  .  Highest education level: Not on file  Occupational History  . Occupation: school system  Tobacco Use  . Smoking status: Never Smoker  . Smokeless tobacco: Never Used  . Tobacco comment: Never smoked  Vaping Use  . Vaping Use: Never used  Substance and Sexual Activity  . Alcohol use: No    Alcohol/week: 0.0 standard drinks  . Drug use: No  . Sexual activity: Yes    Birth control/protection: Surgical  Other Topics Concern  . Not on file  Social History Narrative  . Not on file   Social Determinants of Health   Financial Resource Strain:   . Difficulty of Paying Living Expenses:   Food Insecurity:   . Worried  About Running Out of Food in the Last Year:   . [place] in the Last Year:   Transportation Needs:   . Lack of Transportation (Medical):   Marland Kitchen Lack of Transportation (Non-Medical):   Physical Activity:   . Days of Exercise per Week:   . Minutes of Exercise per Session:   Stress:   . Feeling of Stress :   Social Connections:   . Frequency of Communication with Friends and Family:   . Frequency of Social Gatherings with Friends and Family:   . Attends Religious Services:   . Active Member of Clubs or Organizations:   . Attends Archivist Meetings:   Marland Kitchen Marital Status:      Family History:  The patient's family history includes Breast cancer in her maternal grandmother, sister, and sister; Leukemia in her maternal aunt; Stroke in her mother and sister.   Review of Systems:   Please see the history of present illness.     General:  No chills, fever, night sweats or weight changes.  Cardiovascular:  No chest pain, edema, palpitations, paroxysmal nocturnal dyspnea. Positive for dyspnea on exertion.  Dermatological: No rash, lesions/masses Respiratory: No cough, dyspnea Urologic: No hematuria, dysuria Abdominal:   No nausea, vomiting, diarrhea, bright red blood per rectum, melena, or hematemesis Neurologic:  No visual changes, wkns, changes in mental  status. All other systems reviewed and are otherwise negative except as noted above.   Physical Exam:    VS:  BP 122/74   Pulse 88   Ht 5\' 2"  (1.575 m)   Wt 108 lb 9.6 oz (49.3 kg)   SpO2 93%   BMI 19.86 kg/m    General: Well developed, well nourished,female appearing in no acute distress. Head: Normocephalic, atraumatic, sclera non-icteric.  Neck: No carotid bruits. JVD not elevated.  Lungs: Respirations regular and unlabored, without wheezes or rales.  Heart: Regular rate and rhythm. No S3 or S4.  No murmur, no rubs, or gallops appreciated. Abdomen: Soft, non-tender, non-distended. No obvious abdominal masses. Msk:  Strength and tone appear normal for age. No obvious joint deformities or effusions. Extremities: No clubbing or cyanosis. No lower extremity edema.  Distal pedal pulses are 2+ bilaterally. Radial site stable without ecchymosis or evidence of a hematoma.  Neuro: Alert and oriented X 3. Moves all extremities spontaneously. No focal deficits noted. Psych:  Responds to questions appropriately with a normal affect. Skin: No rashes or lesions noted  Wt Readings from Last 3 Encounters:  10/29/19 108 lb 9.6 oz (49.3 kg)  10/11/19 111 lb (50.3 kg)  09/18/19 107 lb (48.5 kg)     Studies/Labs Reviewed:   EKG:  EKG is not ordered today. EKG from 10/11/2019 is reviewed which shows NSR, HR 95 with no acute ST abnormalities.   Recent Labs: 05/15/2019: ALT 19; Magnesium 2.0 10/08/2019: BUN 15; Creatinine, Ser 0.74; Platelets 261 10/11/2019: Hemoglobin 13.3; Potassium 3.6; Sodium 142   Lipid Panel No results found for: CHOL, TRIG, HDL, CHOLHDL, VLDL, LDLCALC, LDLDIRECT  Additional studies/ records that were reviewed today include:   Echocardiogram: 08/2019 IMPRESSIONS    1. Left ventricular ejection fraction, by estimation, is 30 to 35%. The  left ventricle has moderately decreased function. The left ventricle  demonstrates global hypokinesis with some regional variation.  Left  ventricular diastolic parameters are  indeterminate. Patient appeared to be in sinus tachycardia throughout the  study.  2. Right ventricular systolic function is normal. The right ventricular  size is  normal. There is normal pulmonary artery systolic pressure. The  estimated right ventricular systolic pressure is 88.4 mmHg.  3. The mitral valve is grossly normal. Trivial mitral valve  regurgitation.  4. The aortic valve is tricuspid. Aortic valve regurgitation is not  visualized. Mild aortic valve sclerosis is present, with no evidence of  aortic valve stenosis.  5. The inferior vena cava is normal in size with greater than 50%  respiratory variability, suggesting right atrial pressure of 3 mmHg.    Cardiac Catheterization: 09/2019  There is mild to moderate left ventricular systolic dysfunction.  LV end diastolic pressure is normal.  The left ventricular ejection fraction is 35-45% by visual estimate.   1.  Left dominant coronary system with normal coronary arteries. 2.  Mild to moderately reduced LV systolic function with an EF of 40 to 45%.  EF appears to be slightly improved compared to echo. 3.  Right heart catheterization showed normal right and left-sided filling pressures, normal pulmonary pressure and normal cardiac output.  Recommendations: The patient has nonischemic cardiomyopathy.  Recommend continuing medical therapy.  No evidence of volume overload and if anything, she might be volume depleted.  We will hydrate post procedure.   Assessment:    1. Chronic systolic heart failure ([place])   2. NICM (nonischemic cardiomyopathy) ([place])   3. Tachycardia   4. Essential hypertension      Plan:   In order of problems listed above:  1. Chronic Systolic CHF/NICM - EF was reduced at 30-35% by echo in 08/2019 with catheterization last month showing normal coronary arteries and EF had improved to 40 to 45% and she had normal cardiac output as outlined above.  -  She does describe a productive cough at night but lungs are clear on examination today. Symptoms improve with her inhaler. She denies any specific PND or lower extremity edema and her dyspnea has improved since her last office visit.  - She is currently on Entresto 24-26 mg twice daily. Given normal cardiac output by recent catheterization, will add Toprol-XL 25 mg daily. I encouraged her to cut this in half and take 12.5 mg daily for the first week then titrate to a full tablet. She will follow HR and BP at home and I have asked her to report back with readings. Would anticipate a repeat echo in 3 months for reassessment of her EF.   2. Tachycardia - HR initially recorded as 114 bpm, at 88 on recheck. Will plan to start Toprol-XL as well. She does have hyperthyroidism which is followed by her PCP and Endocrinology.   3. HTN - BP is well controlled at 122/74 during today's visit.  Continue Entresto 24-26 mg twice daily with the addition of Toprol-XL as outlined above.  I did encourage her to follow her readings at home and report back with these.   Medication Adjustments/Labs and Tests Ordered: Current medicines are reviewed at length with the patient today.  Concerns regarding medicines are outlined above.  Medication changes, Labs and Tests ordered today are listed in the Patient Instructions below. Patient Instructions  Medication Instructions:  Your physician has recommended you make the following change in your medication:   Start Toprol XL 25 mg Daily ( May take 12.5 mg Daily at first)   *If you need a refill on your cardiac medications before your next appointment, please call your pharmacy*   Lab Work: NONE   If you have labs (blood work) drawn today and your tests are completely normal,  you will receive your results only by: Marland Kitchen MyChart Message (if you have MyChart) OR . A paper copy in the mail If you have any lab test that is abnormal or we need to change your treatment, we will  call you to review the results.   Testing/Procedures: .NONE  Follow-Up: At [facility], you and your health needs are our priority.  As part of our continuing mission to provide you with exceptional heart care, we have created designated Provider Care Teams.  These Care Teams include your primary Cardiologist (physician) and Advanced Practice Providers (APPs -  Physician Assistants and Nurse Practitioners) who all work together to provide you with the care you need, when you need it.  We recommend signing up for the patient portal called "MyChart".  Sign up information is provided on this After Visit Summary.  MyChart is used to connect with patients for Virtual Visits (Telemedicine).  Patients are able to view lab/test results, encounter notes, upcoming appointments, etc.  Non-urgent messages can be sent to your provider as well.   To learn more about what you can do with MyChart, go to [email or web address].    Your next appointment:   2 month(s)  The format for your next appointment:   In Person  Provider:   Carlyle Dolly, MD or Bernerd Pho, PA-C   Other Instructions Thank you for choosing [facility]!    Signed, Erma Heritage, PA-C  10/29/2019 1:43 PM     [facility] [address] [place], [place] [address] Phone: [phone] Fax: [phone]

## 2019-10-29 NOTE — Patient Instructions (Signed)
Medication Instructions:  Your physician has recommended you make the following change in your medication:   Start Toprol XL 25 mg Daily ( May take 12.5 mg Daily at first)   *If you need a refill on your cardiac medications before your next appointment, please call your pharmacy*   Lab Work: NONE   If you have labs (blood work) drawn today and your tests are completely normal, you will receive your results only by: Marland Kitchen MyChart Message (if you have MyChart) OR . A paper copy in the mail If you have any lab test that is abnormal or we need to change your treatment, we will call you to review the results.   Testing/Procedures: .NONE  Follow-Up: At [facility], you and your health needs are our priority.  As part of our continuing mission to provide you with exceptional heart care, we have created designated Provider Care Teams.  These Care Teams include your primary Cardiologist (physician) and Advanced Practice Providers (APPs -  Physician Assistants and Nurse Practitioners) who all work together to provide you with the care you need, when you need it.  We recommend signing up for the patient portal called "MyChart".  Sign up information is provided on this After Visit Summary.  MyChart is used to connect with patients for Virtual Visits (Telemedicine).  Patients are able to view lab/test results, encounter notes, upcoming appointments, etc.  Non-urgent messages can be sent to your provider as well.   To learn more about what you can do with MyChart, go to [email or web address].    Your next appointment:   2 month(s)  The format for your next appointment:   In Person  Provider:   Carlyle Dolly, MD or Bernerd Pho, PA-C   Other Instructions Thank you for choosing [facility]!

## 2019-11-05 DIAGNOSIS — R0902 Hypoxemia: Secondary | ICD-10-CM | POA: Diagnosis not present

## 2019-11-05 DIAGNOSIS — J449 Chronic obstructive pulmonary disease, unspecified: Secondary | ICD-10-CM | POA: Diagnosis not present

## 2019-11-25 NOTE — Progress Notes (Signed)
Patient Care Team: Iona Beard, MD as PCP - General (Family Medicine) Harl Bowie, Alphonse Guild, MD as PCP - Cardiology (Cardiology) Nicholas Lose, MD as Consulting Physician (Hematology and Oncology) Gery Pray, MD as Consulting Physician (Radiation Oncology) Erroll Luna, MD as Consulting Physician (General Surgery) Jacelyn Pi, MD as Referring Physician (Endocrinology)  DIAGNOSIS:    ICD-10-CM   1. Malignant neoplasm of upper-outer quadrant of left breast in female, estrogen receptor positive ([place])  C50.412    Z17.0     SUMMARY OF ONCOLOGIC HISTORY: Oncology History  Malignant neoplasm of upper-outer quadrant of left breast in female, estrogen receptor positive ([place])  01/22/2019 Initial Diagnosis   Patient palpated a left breast mass. Mammogram showed a 0.9cm mass in the left breast at the 3 o'clock position, no left axillary adenopathy. 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    02/07/2019 Genetic Testing   Negative genetic testing: no pathogenic variants identified on the [facility] Breast Cancer STAT panel + Common Hereditary Cancers panel. 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 The report date is 02/07/2019.  The STAT Breast cancer panel offered by [facility] includes sequencing and rearrangement analysis for the following 9 genes:  ATM, BRCA1, BRCA2, CDH1, CHEK2, PALB2, PTEN, STK11 and TP53.  The Common Hereditary Cancers Panel offered by [facility] includes sequencing and/or deletion duplication testing of the following 47 genes: APC, ATM, AXIN2, BARD1, BMPR1A, BRCA1, BRCA2, BRIP1, CDH1, CDK4, CDKN2A (p14ARF), CDKN2A (p16INK4a), CHEK2, CTNNA1, DICER1, EPCAM (Deletion/duplication testing only), GREM1 (promoter region deletion/duplication testing only), KIT, MEN1, MLH1, MSH2, MSH3, MSH6, MUTYH, NBN, NF1, NHTL1, PALB2, PDGFRA, PMS2, POLD1, POLE, PTEN, RAD50, RAD51C, RAD51D, SDHB, SDHC, SDHD, SMAD4, SMARCA4.  STK11, TP53, TSC1, TSC2, and VHL.  The following genes were evaluated for sequence changes only: SDHA and HOXB13 c.251G>A variant only.   02/21/2019 Surgery   Left lumpectomy ([place]) ([id number]): IDC, 0.9cm, grade 2, clear margins, 2 left axillary lymph nodes negative.   02/21/2019 Cancer Staging   Staging form: Breast, AJCC 8th Edition - Pathologic stage from 02/21/2019: Stage IA (pT1b, pN0, cM0, G2, ER+, PR+, HER2-)    03/04/2019 Oncotype testing   The Oncotype DX score was 3 predicting a risk of outside the breast recurrence over the next 9 years of 3% if the patient's only systemic therapy is tamoxifen for 5 years.     04/01/2019 - 04/30/2019 Radiation Therapy   The patient initially received a dose of 40.05 Gy in 15 fractions to the breast using whole-breast tangent fields. This was delivered using a 3-D conformal technique. The pt received a boost delivering an additional 10 Gy in 5 fractions using a electron boost with 79mV electrons. The total dose was 50.05 Gy.    04/2019 - 04/2024 Anti-estrogen oral therapy   Anastrozole     CHIEF COMPLIANT: Follow-up of left breast cancer on anastrozole   INTERVAL HISTORY: Patricia DULEYis a 64y.o. with above-mentioned history of left breast cancer who underwent a lumpectomy, radiation, and is currently on antiestrogen therapy with anastrozole. Mammogram on 10/25/19 showed no evidence of malignancy bilaterally. She presents to the clinic today for follow-up.    She denies any lumps or nodules in the breast.  Tolerating anastrozole extremely well.  ALLERGIES:  is allergic to hydromet [hydrocodone-homatropine].  MEDICATIONS:  Current Outpatient Medications  Medication Sig Dispense Refill  . albuterol (VENTOLIN HFA) 108 (90 Base) MCG/ACT inhaler Inhale 1 puff into the lungs  every 4 (four) hours as needed for wheezing or shortness of breath.    Marland Kitchen alendronate (FOSAMAX) 70 MG tablet Take 70 mg by mouth every Sunday.     Marland Kitchen anastrozole  (ARIMIDEX) 1 MG tablet Take 1 tablet (1 mg total) by mouth daily. 90 tablet 3  . aspirin 81 MG tablet Take 81 mg by mouth daily.      Marland Kitchen b complex vitamins tablet Take 1 tablet by mouth daily.    . Biotin 5 MG CAPS Take 5 mg by mouth daily.     . calcium carbonate (OS-CAL) 600 MG TABS Take 600 mg by mouth daily with breakfast.     . cholecalciferol (VITAMIN D3) 25 MCG (1000 UT) tablet Take 1,000 Units by mouth daily.     Marland Kitchen loratadine (CLARITIN) 10 MG tablet Take 10 mg by mouth every other day.     . metoprolol succinate (TOPROL XL) 25 MG 24 hr tablet Take 1 tablet (25 mg total) by mouth daily. 90 tablet 3  . montelukast (SINGULAIR) 10 MG tablet Take 10 mg by mouth at bedtime.    . Multiple Vitamin (MULTIVITAMIN) capsule Take 1 capsule by mouth daily.      . OXYGEN Inhale 1.5-2 L into the lungs at bedtime.    . pantoprazole (PROTONIX) 40 MG tablet Take 40 mg by mouth daily.    . sacubitril-valsartan (ENTRESTO) 24-26 MG Take 1 tablet by mouth 2 (two) times daily. 60 tablet 6  . Specialty Vitamins Products (ECHINACEA C COMPLETE PO) Take 1 tablet by mouth daily.     . vitamin E 100 UNIT capsule Take 100 Units by mouth daily.      Marland Kitchen zinc gluconate 50 MG tablet Take 50 mg by mouth daily.     No current facility-administered medications for this visit.    PHYSICAL EXAMINATION: ECOG PERFORMANCE STATUS: 1 - Symptomatic but completely ambulatory  Vitals:   11/26/19 1023  BP: (!) 142/81  Pulse: 98  Resp: 16  Temp: 98.8 F (37.1 C)  SpO2: 97%   Filed Weights   11/26/19 1023  Weight: 111 lb 3.2 oz (50.4 kg)    BREAST: No palpable masses or nodules in either right or left breasts. No palpable axillary supraclavicular or infraclavicular adenopathy no breast tenderness or nipple discharge. (exam performed in the presence of a chaperone)  LABORATORY DATA:  I have reviewed the data as listed CMP Latest Ref Rng & Units 10/11/2019 10/11/2019 10/11/2019  Glucose 70 - 99 mg/dL - - -  BUN 8 - 23  mg/dL - - -  Creatinine 0.44 - 1.00 mg/dL - - -  Sodium 135 - 145 mmol/L 142 141 142  Potassium 3.5 - 5.1 mmol/L 3.6 3.7 3.6  Chloride 98 - 111 mmol/L - - -  CO2 22 - 32 mmol/L - - -  Calcium 8.9 - 10.3 mg/dL - - -  Total Protein 6.5 - 8.1 g/dL - - -  Total Bilirubin 0.3 - 1.2 mg/dL - - -  Alkaline Phos 38 - 126 U/L - - -  AST 15 - 41 U/L - - -  ALT 0 - 44 U/L - - -    Lab Results  Component Value Date   WBC 6.5 10/08/2019   HGB 13.3 10/11/2019   HCT 39.0 10/11/2019   MCV 93.3 10/08/2019   PLT 261 10/08/2019   NEUTROABS 2.6 10/08/2019    ASSESSMENT & PLAN:  Malignant neoplasm of upper-outer quadrant of left breast in female,  estrogen receptor positive ([place]) 01/22/2019:Patient palpated a left breast mass. Mammogram showed a 0.9cm mass in the left breast at the 3 o'clock position, no left axillary adenopathy. 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 T1BN0 stage Ia  Recommendations: 1. 02/21/2019: Left lumpectomy: Grade 2 IDC 0.9 cm with clear margins, 0/2 lymph nodes negative, ER 95%, PR 95%, Ki-67 15%, HER-2 negative, T1bN0 stage Ia 2. Oncotype DX: Recurrence score 3, distant recurrence at 9 years: 3% 3. Adjuvant radiation therapy 04/02/2019-04/30/2019 4. Adjuvant antiestrogen therapy with anastrozole 1 mg daily started January 2021 5.Genetics consult ------------------------------------------------------------------------------------------------------------------------------------------------- Anastrozole toxicities: Denies any adverse effects.  She has mild hot flashes and joint stiffness from before.  These are unchanged.  Breast cancer surveillance: 1.  Breast exam 11/26/2019: Benign 2. Mammogram 10/25/2019: Benign breast density category C  Heart catheterization 10/11/2019: LV dysfunction EF 40% Chronic dry cough: Patient wants to see her primary care doctor and discuss an ENT referral   No orders of the defined types were placed in this  encounter.  The patient has a good understanding of the overall plan. she agrees with it. she will call with any problems that may develop before the next visit here.  Total time spent: 20 mins including face to face time and time spent for planning, charting and coordination of care  Nicholas Lose, MD 11/26/2019  I, Cloyde Reams Dorshimer, am acting as scribe for Dr. Nicholas Lose.  I have reviewed the above documentation for accuracy and completeness, and I agree with the above.

## 2019-11-26 ENCOUNTER — Inpatient Hospital Stay: Payer: Medicare PPO | Attending: Hematology and Oncology | Admitting: Hematology and Oncology

## 2019-11-26 ENCOUNTER — Other Ambulatory Visit: Payer: Self-pay

## 2019-11-26 DIAGNOSIS — Z170 Estrogen receptor positive status [ER+]: Secondary | ICD-10-CM | POA: Diagnosis not present

## 2019-11-26 DIAGNOSIS — Z79811 Long term (current) use of aromatase inhibitors: Secondary | ICD-10-CM | POA: Diagnosis not present

## 2019-11-26 DIAGNOSIS — C50412 Malignant neoplasm of upper-outer quadrant of left female breast: Secondary | ICD-10-CM | POA: Diagnosis not present

## 2019-11-26 MED ORDER — ANASTROZOLE 1 MG PO TABS: 1.0000 mg | ORAL_TABLET | Freq: Every day | ORAL | 3 refills | Status: DC

## 2019-11-26 NOTE — Assessment & Plan Note (Signed)
01/22/2019:Patient palpated a left breast mass. Mammogram showed a 0.9cm mass in the left breast at the 3 o'clock position, no left axillary adenopathy. 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 T1BN0 stage Ia  Recommendations: 1. 02/21/2019: Left lumpectomy: Grade 2 IDC 0.9 cm with clear margins, 0/2 lymph nodes negative, ER 95%, PR 95%, Ki-67 15%, HER-2 negative, T1bN0 stage Ia 2. Oncotype DX: Recurrence score 3, distant recurrence at 9 years: 3% 3. Adjuvant radiation therapy 04/02/2019-04/30/2019 4. Adjuvant antiestrogen therapy with anastrozole 1 mg daily to start January 2021 5.Genetics consult ------------------------------------------------------------------------------------------------------------------------------------------------- Anastrozole toxicities:  Breast cancer surveillance: 1.  Breast exam 11/26/2019: Benign 2. Mammogram 10/25/2019: Benign breast density category C  Heart catheterization 10/11/2019

## 2019-11-27 ENCOUNTER — Telehealth: Payer: Self-pay | Admitting: Hematology and Oncology

## 2019-11-27 NOTE — Telephone Encounter (Signed)
Scheduled per 8/10 los. Called and spoke with pt, confirmed 8/10 appt

## 2019-12-02 DIAGNOSIS — Z9981 Dependence on supplemental oxygen: Secondary | ICD-10-CM | POA: Diagnosis not present

## 2019-12-02 DIAGNOSIS — I10 Essential (primary) hypertension: Secondary | ICD-10-CM | POA: Diagnosis not present

## 2019-12-04 ENCOUNTER — Encounter: Payer: Self-pay | Admitting: Student

## 2019-12-04 ENCOUNTER — Telehealth: Payer: Self-pay | Admitting: Cardiology

## 2019-12-04 NOTE — Telephone Encounter (Signed)
I spoke with patient and relayed B.Strader's message.Patient agrees with plan. Will bring BP log to next visit.

## 2019-12-04 NOTE — Telephone Encounter (Signed)
New message    Patient returning call to Kisha  

## 2019-12-06 DIAGNOSIS — R0902 Hypoxemia: Secondary | ICD-10-CM | POA: Diagnosis not present

## 2019-12-06 DIAGNOSIS — J449 Chronic obstructive pulmonary disease, unspecified: Secondary | ICD-10-CM | POA: Diagnosis not present

## 2019-12-13 ENCOUNTER — Telehealth: Payer: Self-pay | Admitting: Cardiology

## 2019-12-13 NOTE — Telephone Encounter (Signed)
New message    Patient left message on voicemail ryc about medication

## 2019-12-13 NOTE — Telephone Encounter (Signed)
Returned call to pt. No answer. No voice mail.  

## 2019-12-18 DIAGNOSIS — H40003 Preglaucoma, unspecified, bilateral: Secondary | ICD-10-CM | POA: Diagnosis not present

## 2019-12-18 NOTE — Telephone Encounter (Signed)
Pt given pt assistance application to complete and return to office.

## 2019-12-18 NOTE — Telephone Encounter (Signed)
Says she cannot afford $80 for entresto each month. Advised that a patient assistance application will be left at the front desk for pick up. Aware to fill out all patient sections and bring back to office with proof of income and out of pocket expense for 2021.

## 2019-12-25 ENCOUNTER — Encounter: Payer: Self-pay | Admitting: Internal Medicine

## 2019-12-31 ENCOUNTER — Encounter: Payer: Self-pay | Admitting: Cardiology

## 2019-12-31 ENCOUNTER — Other Ambulatory Visit: Payer: Self-pay

## 2019-12-31 ENCOUNTER — Ambulatory Visit: Payer: Medicare PPO | Admitting: Cardiology

## 2019-12-31 ENCOUNTER — Telehealth: Payer: Self-pay | Admitting: *Deleted

## 2019-12-31 VITALS — BP 118/70 | HR 96 | Ht 64.0 in | Wt 110.0 lb

## 2019-12-31 DIAGNOSIS — I5022 Chronic systolic (congestive) heart failure: Secondary | ICD-10-CM

## 2019-12-31 MED ORDER — METOPROLOL SUCCINATE ER 25 MG PO TB24
37.5000 mg | ORAL_TABLET | Freq: Every day | ORAL | 6 refills | Status: DC
Start: 2019-12-31 — End: 2020-06-16

## 2019-12-31 NOTE — Progress Notes (Signed)
Clinical Summary Ms. Beason is a 64 y.o.female seen today for follow up of the following medical problems. This is a focused visit on her history of chronic systolic HF   1. Chronic systolic HF  - 04/8297 echo LVEF 30-35%, Normal RV function - 09/2019 cath: normal coronaries. Mean PA 12, PCWP 1, LVEDP 1, CI 3.4  - completed assistance for entresto - no SOB or DOE, no recent edema     Had covid vaccine in 06/2019.     Past Medical History:  Diagnosis Date  . Cancer ([place])   . Chronic systolic (congestive) heart failure ([facility])    a. EF 30-35% by echo in 08/2019 b. cath in 09/2019 showing normal cors and EF at 40-45%  . Colon polyps   . Family history of breast cancer   . Family history of leukemia   . Hypertension   . Personal history of radiation therapy    Dec 2020- Jan 2021  . PONV (postoperative nausea and vomiting)   . Thyroid disease    h/o hyperactive, just being followed by endocrinologist     Allergies  Allergen Reactions  . Hydromet [Hydrocodone-Homatropine] Nausea And Vomiting     Current Outpatient Medications  Medication Sig Dispense Refill  . albuterol (VENTOLIN HFA) 108 (90 Base) MCG/ACT inhaler Inhale 1 puff into the lungs every 4 (four) hours as needed for wheezing or shortness of breath.    Marland Kitchen alendronate (FOSAMAX) 70 MG tablet Take 70 mg by mouth every Sunday.     Marland Kitchen anastrozole (ARIMIDEX) 1 MG tablet Take 1 tablet (1 mg total) by mouth daily. 90 tablet 3  . aspirin 81 MG tablet Take 81 mg by mouth daily.      Marland Kitchen b complex vitamins tablet Take 1 tablet by mouth daily.    . Biotin 5 MG CAPS Take 5 mg by mouth daily.     . calcium carbonate (OS-CAL) 600 MG TABS Take 600 mg by mouth daily with breakfast.     . cholecalciferol (VITAMIN D3) 25 MCG (1000 UT) tablet Take 1,000 Units by mouth daily.     Marland Kitchen loratadine (CLARITIN) 10 MG tablet Take 10 mg by mouth every other day.     . metoprolol succinate (TOPROL XL) 25 MG 24 hr tablet Take 1 tablet (25  mg total) by mouth daily. 90 tablet 3  . montelukast (SINGULAIR) 10 MG tablet Take 10 mg by mouth at bedtime.    . Multiple Vitamin (MULTIVITAMIN) capsule Take 1 capsule by mouth daily.      . OXYGEN Inhale 1.5-2 L into the lungs at bedtime.    . pantoprazole (PROTONIX) 40 MG tablet Take 40 mg by mouth daily.    . sacubitril-valsartan (ENTRESTO) 24-26 MG Take 1 tablet by mouth 2 (two) times daily. 60 tablet 6  . Specialty Vitamins Products (ECHINACEA C COMPLETE PO) Take 1 tablet by mouth daily.     . vitamin E 100 UNIT capsule Take 100 Units by mouth daily.      Marland Kitchen zinc gluconate 50 MG tablet Take 50 mg by mouth daily.     No current facility-administered medications for this visit.     Past Surgical History:  Procedure Laterality Date  . ABDOMINAL HYSTERECTOMY  2000  . BREAST LUMPECTOMY Left 2020  . BREAST LUMPECTOMY WITH RADIOACTIVE SEED AND SENTINEL LYMPH NODE BIOPSY Left 02/21/2019   Procedure: LEFT BREAST LUMPECTOMY WITH RADIOACTIVE SEED;  Surgeon: Erroll Luna, MD;  Location: [facility];  Service: General;  Laterality: Left;  . CATARACT EXTRACTION W/PHACO Left 03/10/2014   Procedure: CATARACT EXTRACTION PHACO AND INTRAOCULAR LENS PLACEMENT LEFT EYE;  Surgeon: Tonny Azul Brumett, MD;  Location: [facility];  Service: Ophthalmology;  Laterality: Left;  CDE 3.50  . CATARACT EXTRACTION W/PHACO Right 04/14/2014   Procedure: CATARACT EXTRACTION PHACO AND INTRAOCULAR LENS PLACEMENT ([facility]);  Surgeon: Tonny Race Latour, MD;  Location: [facility];  Service: Ophthalmology;  Laterality: Right;  CDE 3.35  . COLONOSCOPY  11/2008   Dr. Gala Romney: normal. Next colonoscopy in 2015 due to h/o polyps by Dr. Tamala Julian and path unknown  . COLONOSCOPY N/A 12/12/2014   Procedure: COLONOSCOPY;  Surgeon: Daneil Dolin, MD;  Location: [facility];  Service: Endoscopy;  Laterality: N/A;  1230  . GANGLION CYST EXCISION Left    x2-wrist- [place]  . RIGHT/LEFT HEART CATH AND CORONARY ANGIOGRAPHY N/A 10/11/2019   Procedure: RIGHT/LEFT HEART CATH  AND CORONARY ANGIOGRAPHY;  Surgeon: Wellington Hampshire, MD;  Location: [facility];  Service: Cardiovascular;  Laterality: N/A;  . SENTINEL NODE BIOPSY Left 02/21/2019   Procedure: Left Sentinel Lymph Node Mapping;  Surgeon: Erroll Luna, MD;  Location: [facility];  Service: General;  Laterality: Left;     Allergies  Allergen Reactions  . Hydromet [Hydrocodone-Homatropine] Nausea And Vomiting      Family History  Problem Relation Age of Onset  . Breast cancer Sister        diagnosed in her 75s  . Stroke Sister   . Breast cancer Maternal Grandmother        diagnosed in her 22s or 86s  . Breast cancer Sister        diagnosed in her 31s  . Stroke Mother   . Leukemia Maternal Aunt        diagnosed in her 34s  . Colon cancer Neg Hx      Social History Ms. Piloto reports that she has never smoked. She has never used smokeless tobacco. Ms. Devivo reports no history of alcohol use.   Review of Systems CONSTITUTIONAL: No weight loss, fever, chills, weakness or fatigue.  HEENT: Eyes: No visual loss, blurred vision, double vision or yellow sclerae.No hearing loss, sneezing, congestion, runny nose or sore throat.  SKIN: No rash or itching.  CARDIOVASCULAR: per hpi RESPIRATORY: No shortness of breath, cough or sputum.  GASTROINTESTINAL: No anorexia, nausea, vomiting or diarrhea. No abdominal pain or blood.  GENITOURINARY: No burning on urination, no polyuria NEUROLOGICAL: No headache, dizziness, syncope, paralysis, ataxia, numbness or tingling in the extremities. No change in bowel or bladder control.  MUSCULOSKELETAL: No muscle, back pain, joint pain or stiffness.  LYMPHATICS: No enlarged nodes. No history of splenectomy.  PSYCHIATRIC: No history of depression or anxiety.  ENDOCRINOLOGIC: No reports of sweating, cold or heat intolerance. No polyuria or polydipsia.  Marland Kitchen   Physical Examination Today's Vitals   12/31/19 1343  BP: 118/70  Pulse: 96  SpO2: 95%  Weight: 110 lb  (49.9 kg)  Height: 5\' 4"  (1.626 m)   Body mass index is 18.88 kg/m.  Gen: resting comfortably, no acute distress HEENT: no scleral icterus, pupils equal round and reactive, no palptable cervical adenopathy,  CV: RRR, no m/r/g, no jvd Resp: Clear to auscultation bilaterally GI: abdomen is soft, non-tender, non-distended, normal bowel sounds, no hepatosplenomegaly MSK: extremities are warm, no edema.  Skin: warm, no rash Neuro:  no focal deficits Psych: appropriate affect   Diagnostic Studies     Assessment and Plan  1. Chronic systolic HF - no current symptoms - increase toprol to 37.5mg  daily - will repeat echo after next visit in 1 month     Arnoldo Lenis, M.D.

## 2019-12-31 NOTE — Telephone Encounter (Signed)
Called to notify pt she has been approved for [employer] patient assistance Delene Loll). Pt voiced understanding.

## 2019-12-31 NOTE — Patient Instructions (Signed)
Medication Instructions:  INCREASE Toprol XL to 37.5 mg daily   *If you need a refill on your cardiac medications before your next appointment, please call your pharmacy*   Lab Work: None today If you have labs (blood work) drawn today and your tests are completely normal, you will receive your results only by: Marland Kitchen MyChart Message (if you have MyChart) OR . A paper copy in the mail If you have any lab test that is abnormal or we need to change your treatment, we will call you to review the results.   Testing/Procedures: None today   Follow-Up: At [facility], you and your health needs are our priority.  As part of our continuing mission to provide you with exceptional heart care, we have created designated Provider Care Teams.  These Care Teams include your primary Cardiologist (physician) and Advanced Practice Providers (APPs -  Physician Assistants and Nurse Practitioners) who all work together to provide you with the care you need, when you need it.  We recommend signing up for the patient portal called "MyChart".  Sign up information is provided on this After Visit Summary.  MyChart is used to connect with patients for Virtual Visits (Telemedicine).  Patients are able to view lab/test results, encounter notes, upcoming appointments, etc.  Non-urgent messages can be sent to your provider as well.   To learn more about what you can do with MyChart, go to [email or web address].    Your next appointment:   1 month(s)  The format for your next appointment:   In Person  Provider:   Carlyle Dolly, MD   Other Instructions None        Thank you for choosing [facility] !

## 2020-01-06 DIAGNOSIS — R0902 Hypoxemia: Secondary | ICD-10-CM | POA: Diagnosis not present

## 2020-01-06 DIAGNOSIS — J449 Chronic obstructive pulmonary disease, unspecified: Secondary | ICD-10-CM | POA: Diagnosis not present

## 2020-02-03 DIAGNOSIS — I5022 Chronic systolic (congestive) heart failure: Secondary | ICD-10-CM | POA: Diagnosis not present

## 2020-02-03 DIAGNOSIS — Z9981 Dependence on supplemental oxygen: Secondary | ICD-10-CM | POA: Diagnosis not present

## 2020-02-03 DIAGNOSIS — I10 Essential (primary) hypertension: Secondary | ICD-10-CM | POA: Diagnosis not present

## 2020-02-05 DIAGNOSIS — R0902 Hypoxemia: Secondary | ICD-10-CM | POA: Diagnosis not present

## 2020-02-05 DIAGNOSIS — J449 Chronic obstructive pulmonary disease, unspecified: Secondary | ICD-10-CM | POA: Diagnosis not present

## 2020-02-19 NOTE — Progress Notes (Signed)
Cardiology Office Note    Date:  02/25/2020   ID:  Berdena, Cisek [birth year], MRN [id number]  PCP:  Iona Beard, MD  Cardiologist: Carlyle Dolly, MD EPS: None  Chief Complaint  Patient presents with   Follow-up    History of Present Illness:  Patricia Nunez is a 64 y.o. female with history of nonischemic cardiomyopathy ejection fraction 30 to 35% on echo 08/2019, normal coronary arteries at cath 09/2019.  Patient last saw Dr. Harl Bowie 12/31/2019 at which time she was doing well on Entresto and Toprol was increased.  Plan was to repeat echo after next visit.  Patient comes in for f/u. Denies chest pain, dyspnea, edema, dyspnea on exertion. Brings readings of BP which runs 102-120/60-70, Pulse 80-109 bpm. Taking claritin D and has a lot of head congestion and drainage, It's been going on for awhile and she's getting frustrated.     Past Medical History:  Diagnosis Date   Cancer ([place])    Chronic systolic (congestive) heart failure (HCC)    a. EF 30-35% by echo in 08/2019 b. cath in 09/2019 showing normal cors and EF at 40-45%   Colon polyps    Family history of breast cancer    Family history of leukemia    Hypertension    Personal history of radiation therapy    Dec 2020- Jan 2021   PONV (postoperative nausea and vomiting)    Thyroid disease    h/o hyperactive, just being followed by endocrinologist    Past Surgical History:  Procedure Laterality Date   ABDOMINAL HYSTERECTOMY  2000   BREAST LUMPECTOMY Left 2020   BREAST LUMPECTOMY WITH RADIOACTIVE SEED AND SENTINEL LYMPH NODE BIOPSY Left 02/21/2019   Procedure: LEFT BREAST LUMPECTOMY WITH RADIOACTIVE SEED;  Surgeon: Erroll Luna, MD;  Location: [place];  Service: General;  Laterality: Left;   CATARACT EXTRACTION W/PHACO Left 03/10/2014   Procedure: CATARACT EXTRACTION PHACO AND INTRAOCULAR LENS PLACEMENT LEFT EYE;  Surgeon: Tonny Branch, MD;  [facility];  Service: Ophthalmology;  Laterality: Left;   CDE 3.50   CATARACT EXTRACTION W/PHACO Right 04/14/2014   Procedure: CATARACT EXTRACTION PHACO AND INTRAOCULAR LENS PLACEMENT (IOC);  Surgeon: Tonny Branch, MD;  Location: [facility];  Service: Ophthalmology;  Laterality: Right;  CDE 3.35   COLONOSCOPY  11/2008   Dr. Gala Romney: normal. Next colonoscopy in 2015 due to h/o polyps by Dr. Tamala Julian and path unknown   COLONOSCOPY N/A 12/12/2014   Procedure: COLONOSCOPY;  Surgeon: Daneil Dolin, MD;  [facility];  Service: Endoscopy;  Laterality: N/A;  1230   GANGLION CYST EXCISION Left    x2-wrist- [place]   RIGHT/LEFT HEART CATH AND CORONARY ANGIOGRAPHY N/A 10/11/2019   Procedure: RIGHT/LEFT HEART CATH AND CORONARY ANGIOGRAPHY;  Surgeon: Wellington Hampshire, MD;  Location: [place] [facility];  Service: Cardiovascular;  Laterality: N/A;   SENTINEL NODE BIOPSY Left 02/21/2019   Procedure: Left Sentinel Lymph Node Mapping;  Surgeon: Erroll Luna, MD;  Location: [place];  Service: General;  Laterality: Left;    Current Medications: Current Meds  Medication Sig   albuterol (VENTOLIN HFA) 108 (90 Base) MCG/ACT inhaler Inhale 1 puff into the lungs every 4 (four) hours as needed for wheezing or shortness of breath.   alendronate (FOSAMAX) 70 MG tablet Take 70 mg by mouth every Sunday.    anastrozole (ARIMIDEX) 1 MG tablet Take 1 tablet (1 mg total) by mouth daily.   aspirin 81 MG tablet Take 81 mg  by mouth daily.     b complex vitamins tablet Take 1 tablet by mouth daily.   Biotin 5 MG CAPS Take 5 mg by mouth daily.    calcium carbonate (OS-CAL) 600 MG TABS Take 600 mg by mouth daily with breakfast.    cholecalciferol (VITAMIN D3) 25 MCG (1000 UT) tablet Take 1,000 Units by mouth daily.    loratadine (CLARITIN) 10 MG tablet Take 10 mg by mouth every other day.    metoprolol succinate (TOPROL XL) 25 MG 24 hr tablet Take 1.5 tablets (37.5 mg total) by mouth daily.   montelukast (SINGULAIR) 10 MG tablet Take 10 mg by mouth at bedtime.    Multiple Vitamin (MULTIVITAMIN) capsule Take 1 capsule by mouth daily.     pantoprazole (PROTONIX) 40 MG tablet Take 40 mg by mouth daily.   sacubitril-valsartan (ENTRESTO) 24-26 MG Take 1 tablet by mouth 2 (two) times daily.   vitamin E 100 UNIT capsule Take 100 Units by mouth daily.     zinc gluconate 50 MG tablet Take 50 mg by mouth daily.     Allergies:   Hydromet [hydrocodone-homatropine]   Social History   Socioeconomic History   Marital status: Divorced    Spouse name: Not on file   Number of children: 1   Years of education: Not on file   Highest education level: Not on file  Occupational History   Occupation: school system  Tobacco Use   Smoking status: Never Smoker   Smokeless tobacco: Never Used   Tobacco comment: Never smoked  Vaping Use   Vaping Use: Never used  Substance and Sexual Activity   Alcohol use: No    Alcohol/week: 0.0 standard drinks   Drug use: No   Sexual activity: Yes    Birth control/protection: Surgical  Other Topics Concern   Not on file  Social History Narrative   Not on file   Social Determinants of Health   Financial Resource Strain:    Difficulty of Paying Living Expenses: Not on file  Food Insecurity:    Worried About Charity fundraiser in the Last Year: Not on file   [employer] of Food in the Last Year: Not on file  Transportation Needs:    Lack of Transportation (Medical): Not on file   Lack of Transportation (Non-Medical): Not on file  Physical Activity:    Days of Exercise per Week: Not on file   Minutes of Exercise per Session: Not on file  Stress:    Feeling of Stress : Not on file  Social Connections:    Frequency of Communication with Friends and Family: Not on file   Frequency of Social Gatherings with Friends and Family: Not on file   Attends Religious Services: Not on file   Active Member of Clubs or Organizations: Not on file   Attends Archivist Meetings: Not on file    Marital Status: Not on file     Family History:  The patient's family history includes Breast cancer in her maternal grandmother, sister, and sister; Leukemia in her maternal aunt; Stroke in her mother and sister.   ROS:   Please see the history of present illness.    ROS All other systems reviewed and are negative.   PHYSICAL EXAM:   VS:  BP 132/70    Pulse 94    Ht 5\' 2"  (1.575 m)    Wt 118 lb 6.4 oz (53.7 kg)    SpO2 100%  BMI 21.66 kg/m   Physical Exam  FGH:WEXH, in no acute distress  Neck: no JVD, carotid bruits, or masses Cardiac:RRR; no murmurs, rubs, or gallops  Respiratory:  clear to auscultation bilaterally, normal work of breathing GI: soft, nontender, nondistended, + BS Ext: without cyanosis, clubbing, or edema, Good distal pulses bilaterally Neuro:  Alert and Oriented x 3 Psych: euthymic mood, full affect  Wt Readings from Last 3 Encounters:  02/25/20 118 lb 6.4 oz (53.7 kg)  12/31/19 110 lb (49.9 kg)  11/26/19 111 lb 3.2 oz (50.4 kg)      Studies/Labs Reviewed:   EKG:  EKG is not ordered today.    Recent Labs: 05/15/2019: ALT 19; Magnesium 2.0 10/08/2019: BUN 15; Creatinine, Ser 0.74; Platelets 261 10/11/2019: Hemoglobin 13.3; Potassium 3.6; Sodium 142   Lipid Panel No results found for: CHOL, TRIG, HDL, CHOLHDL, VLDL, LDLCALC, LDLDIRECT  Additional studies/ records that were reviewed today include:  2D echo 09/09/2019 IMPRESSIONS     1. Left ventricular ejection fraction, by estimation, is 30 to 35%. The  left ventricle has moderately decreased function. The left ventricle  demonstrates global hypokinesis with some regional variation. Left  ventricular diastolic parameters are  indeterminate. Patient appeared to be in sinus tachycardia throughout the  study.   2. Right ventricular systolic function is normal. The right ventricular  size is normal. There is normal pulmonary artery systolic pressure. The  estimated right ventricular systolic pressure  is 37.1 mmHg.   3. The mitral valve is grossly normal. Trivial mitral valve  regurgitation.   4. The aortic valve is tricuspid. Aortic valve regurgitation is not  visualized. Mild aortic valve sclerosis is present, with no evidence of  aortic valve stenosis.   5. The inferior vena cava is normal in size with greater than 50%  respiratory variability, suggesting right atrial pressure of 3 mmHg.   Cardiac catheterization 10/11/2019  There is mild to moderate left ventricular systolic dysfunction.  LV end diastolic pressure is normal.  The left ventricular ejection fraction is 35-45% by visual estimate.   1.  Left dominant coronary system with normal coronary arteries. 2.  Mild to moderately reduced LV systolic function with an EF of 40 to 45%.  EF appears to be slightly improved compared to echo. 3.  Right heart catheterization showed normal right and left-sided filling pressures, normal pulmonary pressure and normal cardiac output.   Recommendations: The patient has nonischemic cardiomyopathy.  Recommend continuing medical therapy.  No evidence of volume overload and if anything, she might be volume depleted.  We will hydrate post procedure.      ASSESSMENT:    1. NICM (nonischemic cardiomyopathy) ([place])   2. Chronic systolic CHF (congestive heart failure) ([place])   3. Chronic nasal congestion      PLAN:  In order of problems listed above:  Nonischemic cardiomyopathy ejection fraction 30 to 35% echo 08/2019, cardiac cath 09/2019 normal coronary arteries.  Patient's heart rate is up in the high 90s at rest and running high at home as well.  Blood pressures on the low side.  She is taking Claritin-D.  Have asked her to stop the decongestants and take regular Claritin.  She will call us next week with heart rate readings and if it still in the 90s try to increase Toprol XL to 50 mg once daily.  Order echo in 2 to 3 weeks.  Chronic systolic CHF on Entresto and Toprol well compensated.   No evidence of heart failure.  Sinus  congestion requesting referral to ENT.  Has been going on for a long time.  Have put a referral in.    Medication Adjustments/Labs and Tests Ordered: Current medicines are reviewed at length with the patient today.  Concerns regarding medicines are outlined above.  Medication changes, Labs and Tests ordered today are listed in the Patient Instructions below. There are no Patient Instructions on file for this visit.   Sumner Boast, PA-C  02/25/2020 12:04 PM    [facility] [address], [place], [place]  [address] Phone: [phone]; Fax: [phone]

## 2020-02-25 ENCOUNTER — Ambulatory Visit: Payer: Medicare PPO | Admitting: Physician Assistant

## 2020-02-25 ENCOUNTER — Other Ambulatory Visit: Payer: Self-pay

## 2020-02-25 ENCOUNTER — Encounter: Payer: Self-pay | Admitting: Physician Assistant

## 2020-02-25 VITALS — BP 132/70 | HR 94 | Ht 62.0 in | Wt 118.4 lb

## 2020-02-25 DIAGNOSIS — I428 Other cardiomyopathies: Secondary | ICD-10-CM

## 2020-02-25 DIAGNOSIS — R0981 Nasal congestion: Secondary | ICD-10-CM

## 2020-02-25 DIAGNOSIS — I5022 Chronic systolic (congestive) heart failure: Secondary | ICD-10-CM

## 2020-02-25 NOTE — Patient Instructions (Signed)
Medication Instructions:  STOP Claritin D, you can take Claritin (without the D)  *If you need a refill on your cardiac medications before your next appointment, please call your pharmacy*   Lab Work: None today  If you have labs (blood work) drawn today and your tests are completely normal, you will receive your results only by: Marland Kitchen MyChart Message (if you have MyChart) OR . A paper copy in the mail If you have any lab test that is abnormal or we need to change your treatment, we will call you to review the results.    Testing/Procedures:  Your physician has requested that you have an echocardiogram in 3 weeks. Echocardiography is a painless test that uses sound waves to create images of your heart. It provides your doctor with information about the size and shape of your heart and how well your heart's chambers and valves are working. This procedure takes approximately one hour. There are no restrictions for this procedure.     Follow-Up: At [facility], you and your health needs are our priority.  As part of our continuing mission to provide you with exceptional heart care, we have created designated Provider Care Teams.  These Care Teams include your primary Cardiologist (physician) and Advanced Practice Providers (APPs -  Physician Assistants and Nurse Practitioners) who all work together to provide you with the care you need, when you need it.  We recommend signing up for the patient portal called "MyChart".  Sign up information is provided on this After Visit Summary.  MyChart is used to connect with patients for Virtual Visits (Telemedicine).  Patients are able to view lab/test results, encounter notes, upcoming appointments, etc.  Non-urgent messages can be sent to your provider as well.   To learn more about what you can do with MyChart, go to [email or web address].    Your next appointment:   3-4 month(s)  The format for your next appointment:   In Person  Provider:    Carlyle Dolly, MD   Other Instructions You have been referred to ENT    They will call you to schedule apt.

## 2020-03-07 DIAGNOSIS — R0902 Hypoxemia: Secondary | ICD-10-CM | POA: Diagnosis not present

## 2020-03-07 DIAGNOSIS — J449 Chronic obstructive pulmonary disease, unspecified: Secondary | ICD-10-CM | POA: Diagnosis not present

## 2020-03-17 ENCOUNTER — Ambulatory Visit (HOSPITAL_COMMUNITY)
Admission: RE | Admit: 2020-03-17 | Discharge: 2020-03-17 | Disposition: A | Discharge status: Home / Self Care | Payer: Medicare PPO | Source: Ambulatory Visit | Attending: Family Medicine | Admitting: Family Medicine

## 2020-03-17 ENCOUNTER — Other Ambulatory Visit: Payer: Self-pay

## 2020-03-17 DIAGNOSIS — I5022 Chronic systolic (congestive) heart failure: Secondary | ICD-10-CM | POA: Insufficient documentation

## 2020-03-17 LAB — ECHOCARDIOGRAM COMPLETE
Area-P 1/2: 2.48 cm2
Calc EF: 55.4 %
S' Lateral: 2.93 cm
Single Plane A2C EF: 55.1 %
Single Plane A4C EF: 50.8 %

## 2020-03-17 NOTE — Progress Notes (Signed)
*  PRELIMINARY RESULTS* Echocardiogram 2D Echocardiogram has been performed.  Patricia Nunez 03/17/2020, 9:34 AM

## 2020-03-24 ENCOUNTER — Other Ambulatory Visit: Payer: Self-pay

## 2020-03-24 ENCOUNTER — Encounter (INDEPENDENT_AMBULATORY_CARE_PROVIDER_SITE_OTHER): Payer: Self-pay | Admitting: Otolaryngology

## 2020-03-24 ENCOUNTER — Ambulatory Visit (INDEPENDENT_AMBULATORY_CARE_PROVIDER_SITE_OTHER): Payer: Medicare PPO | Admitting: Otolaryngology

## 2020-03-24 VITALS — Temp 97.3°F

## 2020-03-24 DIAGNOSIS — J342 Deviated nasal septum: Secondary | ICD-10-CM | POA: Diagnosis not present

## 2020-03-24 DIAGNOSIS — J310 Chronic rhinitis: Secondary | ICD-10-CM

## 2020-03-24 DIAGNOSIS — J329 Chronic sinusitis, unspecified: Secondary | ICD-10-CM | POA: Diagnosis not present

## 2020-03-24 NOTE — Progress Notes (Signed)
HPI: Patricia Nunez is a 64 y.o. female who presents is referred by Ermalinda Barrios, PA for evaluation of chronic nasal obstruction and congestion.  She has had worsening problems over the past year.  She has tried several different medications including nasal steroid sprays with minimal benefit.  The right side is generally worse than the left side.  She has occasional thick mucus discharge.  She had a cough on and off..  Past Medical History:  Diagnosis Date  . Cancer ([place])   . Chronic systolic (congestive) heart failure (HCC)    a. EF 30-35% by echo in 08/2019 b. cath in 09/2019 showing normal cors and EF at 40-45%  . Colon polyps   . Family history of breast cancer   . Family history of leukemia   . Hypertension   . Personal history of radiation therapy    Dec 2020- Jan 2021  . PONV (postoperative nausea and vomiting)   . Thyroid disease    h/o hyperactive, just being followed by endocrinologist   Past Surgical History:  Procedure Laterality Date  . ABDOMINAL HYSTERECTOMY  2000  . BREAST LUMPECTOMY Left 2020  . BREAST LUMPECTOMY WITH RADIOACTIVE SEED AND SENTINEL LYMPH NODE BIOPSY Left 02/21/2019   Procedure: LEFT BREAST LUMPECTOMY WITH RADIOACTIVE SEED;  Surgeon: Erroll Luna, MD;  Location: [place];  Service: General;  Laterality: Left;  . CATARACT EXTRACTION W/PHACO Left 03/10/2014   Procedure: CATARACT EXTRACTION PHACO AND INTRAOCULAR LENS PLACEMENT LEFT EYE;  Surgeon: Tonny Branch, MD;  [facility];  Service: Ophthalmology;  Laterality: Left;  CDE 3.50  . CATARACT EXTRACTION W/PHACO Right 04/14/2014   Procedure: CATARACT EXTRACTION PHACO AND INTRAOCULAR LENS PLACEMENT (IOC);  Surgeon: Tonny Branch, MD;  Location: [facility];  Service: Ophthalmology;  Laterality: Right;  CDE 3.35  . COLONOSCOPY  11/2008   Dr. Gala Romney: normal. Next colonoscopy in 2015 due to h/o polyps by Dr. Tamala Julian and path unknown  . COLONOSCOPY N/A 12/12/2014   Procedure: COLONOSCOPY;  Surgeon: Daneil Dolin, MD;   [facility];  Service: Endoscopy;  Laterality: N/A;  1230  . GANGLION CYST EXCISION Left    x2-wrist- Smith  . RIGHT/LEFT HEART CATH AND CORONARY ANGIOGRAPHY N/A 10/11/2019   Procedure: RIGHT/LEFT HEART CATH AND CORONARY ANGIOGRAPHY;  Surgeon: Wellington Hampshire, MD;  Location: [place] [facility];  Service: Cardiovascular;  Laterality: N/A;  . SENTINEL NODE BIOPSY Left 02/21/2019   Procedure: Left Sentinel Lymph Node Mapping;  Surgeon: Erroll Luna, MD;  Location: [place];  Service: General;  Laterality: Left;   Social History   Socioeconomic History  . Marital status: Divorced    Spouse name: Not on file  . Number of children: 1  . Years of education: Not on file  . Highest education level: Not on file  Occupational History  . Occupation: school system  Tobacco Use  . Smoking status: Never Smoker  . Smokeless tobacco: Never Used  . Tobacco comment: Never smoked  Vaping Use  . Vaping Use: Never used  Substance and Sexual Activity  . Alcohol use: No    Alcohol/week: 0.0 standard drinks  . Drug use: No  . Sexual activity: Yes    Birth control/protection: Surgical  Other Topics Concern  . Not on file  Social History Narrative  . Not on file   Social Determinants of Health   Financial Resource Strain:   . Difficulty of Paying Living Expenses: Not on file  Food Insecurity:   . Worried  About Running Out of Food in the Last Year: Not on file  . Ran Out of Food in the Last Year: Not on file  Transportation Needs:   . Lack of Transportation (Medical): Not on file  . Lack of Transportation (Non-Medical): Not on file  Physical Activity:   . Days of Exercise per Week: Not on file  . Minutes of Exercise per Session: Not on file  Stress:   . Feeling of Stress : Not on file  Social Connections:   . Frequency of Communication with Friends and Family: Not on file  . Frequency of Social Gatherings with Friends and Family: Not on file  . Attends Religious Services: Not on  file  . Active Member of Clubs or Organizations: Not on file  . Attends Archivist Meetings: Not on file  . Marital Status: Not on file   Family History  Problem Relation Age of Onset  . Breast cancer Sister        diagnosed in her 76s  . Stroke Sister   . Breast cancer Maternal Grandmother        diagnosed in her 7s or 15s  . Breast cancer Sister        diagnosed in her 52s  . Stroke Mother   . Leukemia Maternal Aunt        diagnosed in her 49s  . Colon cancer Neg Hx    Allergies  Allergen Reactions  . Hydromet [Hydrocodone-Homatropine] Nausea And Vomiting   Prior to Admission medications   Medication Sig Start Date End Date Taking? Authorizing Provider  albuterol (VENTOLIN HFA) 108 (90 Base) MCG/ACT inhaler Inhale 1 puff into the lungs every 4 (four) hours as needed for wheezing or shortness of breath. 05/06/19  Yes [provider]  alendronate (FOSAMAX) 70 MG tablet Take 70 mg by mouth every Sunday.  01/12/19  Yes [provider]  anastrozole (ARIMIDEX) 1 MG tablet Take 1 tablet (1 mg total) by mouth daily. 11/26/19  Yes Nicholas Lose, MD  aspirin 81 MG tablet Take 81 mg by mouth daily.     Yes [provider]  b complex vitamins tablet Take 1 tablet by mouth daily.   Yes [provider]  Biotin 5 MG CAPS Take 5 mg by mouth daily.    Yes [provider]  calcium carbonate (OS-CAL) 600 MG TABS Take 600 mg by mouth daily with breakfast.    Yes [provider]  cholecalciferol (VITAMIN D3) 25 MCG (1000 UT) tablet Take 1,000 Units by mouth daily.    Yes [provider]  loratadine (CLARITIN) 10 MG tablet Take 10 mg by mouth every other day.    Yes [provider]  metoprolol succinate (TOPROL XL) 25 MG 24 hr tablet Take 1.5 tablets (37.5 mg total) by mouth daily. 12/31/19  Yes Branch, Alphonse Guild, MD  montelukast (SINGULAIR) 10 MG tablet Take 10 mg by mouth at bedtime.   Yes [provider]   Multiple Vitamin (MULTIVITAMIN) capsule Take 1 capsule by mouth daily.     Yes [provider]  OXYGEN Inhale 1.5-2 L into the lungs at bedtime.    Yes [provider]  pantoprazole (PROTONIX) 40 MG tablet Take 40 mg by mouth daily.   Yes [provider]  sacubitril-valsartan (ENTRESTO) 24-26 MG Take 1 tablet by mouth 2 (two) times daily. 09/18/19  Yes BranchAlphonse Guild, MD  vitamin E 100 UNIT capsule Take 100 Units by mouth daily.  Yes [provider]  zinc gluconate 50 MG tablet Take 50 mg by mouth daily.   Yes [provider]     Positive ROS: Otherwise negative  All other systems have been reviewed and were otherwise negative with the exception of those mentioned in the HPI and as above.  Physical Exam: Constitutional: Alert, well-appearing, no acute distress Ears: External ears without lesions or tenderness. Ear canals are clear bilaterally with intact, clear TMs.  Nasal: External nose without lesions. Septum deviated to the right anteriorly and to the left posteriorly..  Nasal endoscopy was performed.  On nasal endoscopy patient has very edematous nasal mucosa the left middle meatus is very edematous with mostly clear mucus discharge.  It is difficult to pass the endoscope posteriorly on the left side secondary to posterior septal deviation on the left side.  On endoscopy on the right side the middle meatus was reasonably clear although edematous with no obvious mucopurulent discharge.  No gross polyps noted although membranes were very edematous. Oral: Lips and gums without lesions. Tongue and palate mucosa without lesions. Posterior oropharynx clear. Neck: No palpable adenopathy or masses Respiratory: Breathing comfortably  Skin: No facial/neck lesions or rash noted.  Nasal/sinus endoscopy  Date/Time: 03/24/2020 6:50 PM Performed by: Rozetta Nunnery, MD Authorized by: Rozetta Nunnery, MD   Consent:    Consent obtained:   Verbal   Consent given by:  Patient Procedure details:    Indications: sino-nasal symptoms     Medication:  Afrin   Instrument: flexible fiberoptic nasal endoscope     Scope location: bilateral nare   Septum:    normal     Deviation: deviated to the right, deviated to the left, anterior deviation and posterior deviation     Severity of deviation: intermediate   Sinus:    Right middle meatus: normal     inflammation     Left middle meatus: normal     inflammation     Right nasopharynx: normal     Left nasopharynx: normal   Comments:     On nasal endoscopy patient had a significant septal deviation.  She also had chronic inflammation in edematous nasal mucosa more on the left side.  No gross polyps noted but mucosa was very edematous and inflamed.    Assessment: Septal deviation Chronic rhinitis with questionable sinusitis  Plan: Placed on a Sterapred 10 mg 6-day Dosepak. Also prescribed Augmentin 875 mg twice daily for 10days Also prescribed Nasacort 2 sprays each nostril at night. We will plan on scheduling a CT scan of her sinuses in 2 to 3 weeks and have her follow-up here in a month.   Radene Journey, MD   CC:

## 2020-03-25 ENCOUNTER — Other Ambulatory Visit (INDEPENDENT_AMBULATORY_CARE_PROVIDER_SITE_OTHER): Payer: Self-pay

## 2020-03-25 DIAGNOSIS — J329 Chronic sinusitis, unspecified: Secondary | ICD-10-CM

## 2020-03-27 ENCOUNTER — Ambulatory Visit
Admission: RE | Admit: 2020-03-27 | Discharge: 2020-03-27 | Disposition: A | Discharge status: Home / Self Care | Payer: Medicare PPO | Source: Ambulatory Visit | Attending: Otolaryngology | Admitting: Otolaryngology

## 2020-03-27 DIAGNOSIS — J320 Chronic maxillary sinusitis: Secondary | ICD-10-CM | POA: Diagnosis not present

## 2020-03-27 DIAGNOSIS — J321 Chronic frontal sinusitis: Secondary | ICD-10-CM | POA: Diagnosis not present

## 2020-03-27 DIAGNOSIS — Z853 Personal history of malignant neoplasm of breast: Secondary | ICD-10-CM | POA: Diagnosis not present

## 2020-03-27 DIAGNOSIS — J329 Chronic sinusitis, unspecified: Secondary | ICD-10-CM

## 2020-03-27 DIAGNOSIS — R0981 Nasal congestion: Secondary | ICD-10-CM | POA: Diagnosis not present

## 2020-04-06 DIAGNOSIS — R0902 Hypoxemia: Secondary | ICD-10-CM | POA: Diagnosis not present

## 2020-04-06 DIAGNOSIS — J449 Chronic obstructive pulmonary disease, unspecified: Secondary | ICD-10-CM | POA: Diagnosis not present

## 2020-05-04 ENCOUNTER — Ambulatory Visit (INDEPENDENT_AMBULATORY_CARE_PROVIDER_SITE_OTHER): Payer: Medicare PPO | Admitting: Otolaryngology

## 2020-05-06 ENCOUNTER — Encounter (HOSPITAL_COMMUNITY): Payer: Self-pay | Admitting: *Deleted

## 2020-05-06 ENCOUNTER — Other Ambulatory Visit: Payer: Self-pay

## 2020-05-06 ENCOUNTER — Emergency Department (HOSPITAL_COMMUNITY): Payer: Medicare PPO

## 2020-05-06 ENCOUNTER — Emergency Department (HOSPITAL_COMMUNITY)
Admission: EM | Admit: 2020-05-06 | Discharge: 2020-05-06 | Disposition: A | Discharge status: Home / Self Care | Payer: Medicare PPO | Attending: Emergency Medicine | Admitting: Emergency Medicine

## 2020-05-06 DIAGNOSIS — R059 Cough, unspecified: Secondary | ICD-10-CM | POA: Diagnosis not present

## 2020-05-06 DIAGNOSIS — I110 Hypertensive heart disease with heart failure: Secondary | ICD-10-CM | POA: Diagnosis not present

## 2020-05-06 DIAGNOSIS — Z79899 Other long term (current) drug therapy: Secondary | ICD-10-CM | POA: Insufficient documentation

## 2020-05-06 DIAGNOSIS — Z853 Personal history of malignant neoplasm of breast: Secondary | ICD-10-CM | POA: Diagnosis not present

## 2020-05-06 DIAGNOSIS — R079 Chest pain, unspecified: Secondary | ICD-10-CM | POA: Diagnosis not present

## 2020-05-06 DIAGNOSIS — I5022 Chronic systolic (congestive) heart failure: Secondary | ICD-10-CM | POA: Insufficient documentation

## 2020-05-06 DIAGNOSIS — U071 COVID-19: Secondary | ICD-10-CM | POA: Diagnosis not present

## 2020-05-06 DIAGNOSIS — R0902 Hypoxemia: Secondary | ICD-10-CM | POA: Diagnosis not present

## 2020-05-06 DIAGNOSIS — J069 Acute upper respiratory infection, unspecified: Secondary | ICD-10-CM | POA: Diagnosis not present

## 2020-05-06 DIAGNOSIS — R000 Tachycardia, unspecified: Secondary | ICD-10-CM | POA: Diagnosis not present

## 2020-05-06 DIAGNOSIS — Z20822 Contact with and (suspected) exposure to covid-19: Secondary | ICD-10-CM | POA: Insufficient documentation

## 2020-05-06 DIAGNOSIS — R062 Wheezing: Secondary | ICD-10-CM | POA: Diagnosis not present

## 2020-05-06 DIAGNOSIS — Z7982 Long term (current) use of aspirin: Secondary | ICD-10-CM | POA: Diagnosis not present

## 2020-05-06 DIAGNOSIS — R0602 Shortness of breath: Secondary | ICD-10-CM | POA: Diagnosis not present

## 2020-05-06 DIAGNOSIS — R918 Other nonspecific abnormal finding of lung field: Secondary | ICD-10-CM | POA: Diagnosis not present

## 2020-05-06 LAB — COMPREHENSIVE METABOLIC PANEL
ALT: 26 U/L (ref 0–44)
AST: 29 U/L (ref 15–41)
Albumin: 4.3 g/dL (ref 3.5–5.0)
Alkaline Phosphatase: 50 U/L (ref 38–126)
Anion gap: 9 (ref 5–15)
BUN: 17 mg/dL (ref 8–23)
CO2: 28 mmol/L (ref 22–32)
Calcium: 9 mg/dL (ref 8.9–10.3)
Chloride: 101 mmol/L (ref 98–111)
Creatinine, Ser: 0.77 mg/dL (ref 0.44–1.00)
GFR, Estimated: >60 mL/min (ref >60)
Glucose, Bld: 162 mg/dL — ABNORMAL HIGH (ref 70–99)
Potassium: 3.7 mmol/L (ref 3.5–5.1)
Sodium: 138 mmol/L (ref 135–145)
Total Bilirubin: 0.3 mg/dL (ref 0.3–1.2)
Total Protein: 7.2 g/dL (ref 6.5–8.1)

## 2020-05-06 LAB — CBC WITH DIFFERENTIAL/PLATELET
Abs Immature Granulocytes: 0.04 10*3/uL (ref 0.00–0.07)
Basophils Absolute: 0.1 10*3/uL (ref 0.0–0.1)
Basophils Relative: 1 %
Eosinophils Absolute: 0.3 10*3/uL (ref 0.0–0.5)
Eosinophils Relative: 4 %
HCT: 44.7 % (ref 36.0–46.0)
Hemoglobin: 14.1 g/dL (ref 12.0–15.0)
Immature Granulocytes: 1 %
Lymphocytes Relative: 12 %
Lymphs Abs: 0.8 10*3/uL (ref 0.7–4.0)
MCH: 29.7 pg (ref 26.0–34.0)
MCHC: 31.5 g/dL (ref 30.0–36.0)
MCV: 94.1 fL (ref 80.0–100.0)
Monocytes Absolute: 0.3 10*3/uL (ref 0.1–1.0)
Monocytes Relative: 5 %
Neutro Abs: 5.3 10*3/uL (ref 1.7–7.7)
Neutrophils Relative %: 77 %
Platelets: 246 10*3/uL (ref 150–400)
RBC: 4.75 MIL/uL (ref 3.87–5.11)
RDW: 12.4 % (ref 11.5–15.5)
WBC: 6.8 10*3/uL (ref 4.0–10.5)
nRBC: 0 % (ref 0.0–0.2)

## 2020-05-06 LAB — TROPONIN I (HIGH SENSITIVITY)
Troponin I (High Sensitivity): 4 ng/L
Troponin I (High Sensitivity): 5 ng/L

## 2020-05-06 LAB — BRAIN NATRIURETIC PEPTIDE: B Natriuretic Peptide: 32 pg/mL (ref 0.0–100.0)

## 2020-05-06 LAB — D-DIMER, QUANTITATIVE: D-Dimer, Quant: 0.34 ug/mL-FEU (ref 0.00–0.50)

## 2020-05-06 LAB — RESP PANEL BY RT-PCR (FLU A&B, COVID) ARPGX2
Influenza A by PCR: NEGATIVE
Influenza B by PCR: NEGATIVE
SARS Coronavirus 2 by RT PCR: NEGATIVE

## 2020-05-06 MED ORDER — SODIUM CHLORIDE 0.9 % IV BOLUS
500.0000 mL | Freq: Once | INTRAVENOUS | Status: AC
  Administered 2020-05-06: 500 mL via INTRAVENOUS

## 2020-05-06 MED ORDER — IOHEXOL 350 MG/ML SOLN
100.0000 mL | Freq: Once | INTRAVENOUS | Status: AC | PRN
  Administered 2020-05-06: 75 mL via INTRAVENOUS

## 2020-05-06 MED ORDER — IPRATROPIUM-ALBUTEROL 0.5-2.5 (3) MG/3ML IN SOLN
3.0000 mL | Freq: Once | RESPIRATORY_TRACT | Status: DC
  Filled 2020-05-06: qty 3

## 2020-05-06 NOTE — ED Notes (Signed)
Pt ambulated in room on 2L O2 via [place] and O2 sats went from 99% down to 93%. Pt had increased work of breathing and difficulty breathing while ambulating. Pt walked with steady gait. Pt returned to bed with the 2L O2 and after a few minutes pt's work of breathing went back to normal.

## 2020-05-06 NOTE — ED Triage Notes (Signed)
Pt brought in from home by [facility] with c/o SOB and cough x 3 days, worsening today. Pt normally wears 2L of O2 via [place], but increased it to 4L today due to SOB. Pt was given Albuterol breathing treatment by EMS with much improvement. O2 sats were 96% on the 4L when EMS arrived.

## 2020-05-06 NOTE — ED Provider Notes (Signed)
[facility] EMERGENCY DEPARTMENT Provider Note   CSN: [id number] Arrival date & time: 05/06/20  [id number]     History Chief Complaint  Patient presents with  . Shortness of Breath    Patricia Nunez is a 65 y.o. female.  HPI 65 year old female with a history of breast cancer, CHF with an EF of 30 to 35% as of 5/21 presents to the ER with complaints of shortness of breath and cough since Sunday 1/16.  Has felt increasingly short of breath today.  States that she was diagnosed with pneumonia last year and was given home oxygen to use as needed.  She had felt more short of breath and felt like her "O2 sats dropped "but did not measure with pulse ox, increased her O2 to 4 L.  No prior history of COPD or asthma.  EMS noted she was very wheezy on exam, she was given an albuterol treatment and improved.  She reports some chest tightness, however no chest pain.  No pleuritic symptoms.  No recent travel.  No known fevers or chills, but does endorse night sweats.  No nausea, vomiting, diarrhea.  She is fully vaccinated and boosted.    Past Medical History:  Diagnosis Date  . Cancer [facility])    left breast  . Chronic systolic (congestive) heart failure ([place])    a. EF 30-35% by echo in 08/2019 b. cath in 09/2019 showing normal cors and EF at 40-45%  . Colon polyps   . Family history of breast cancer   . Family history of leukemia   . Hypertension   . Personal history of radiation therapy    Dec 2020- Jan 2021  . PONV (postoperative nausea and vomiting)   . Thyroid disease    h/o hyperactive, just being followed by endocrinologist    Patient Active Problem List   Diagnosis Date Noted  . Chronic systolic heart failure ([place])   . PNA (pneumonia) 05/13/2019  . Acute respiratory failure with hypoxia ([place]) 05/13/2019  . Genetic testing 02/07/2019  . Family history of breast cancer   . Family history of leukemia   . Malignant neoplasm of upper-outer quadrant of left breast in female, estrogen  receptor positive ([employer]) 01/22/2019  . History of colonic polyps 11/25/2014    Past Surgical History:  Procedure Laterality Date  . ABDOMINAL HYSTERECTOMY  2000  . BREAST LUMPECTOMY Left 2020  . BREAST LUMPECTOMY WITH RADIOACTIVE SEED AND SENTINEL LYMPH NODE BIOPSY Left 02/21/2019   Procedure: LEFT BREAST LUMPECTOMY WITH RADIOACTIVE SEED;  Surgeon: Erroll Luna, MD;  Location: [place];  Service: General;  Laterality: Left;  . CATARACT EXTRACTION W/PHACO Left 03/10/2014   Procedure: CATARACT EXTRACTION PHACO AND INTRAOCULAR LENS PLACEMENT LEFT EYE;  Surgeon: Tonny Branch, MD;  Location: [facility];  Service: Ophthalmology;  Laterality: Left;  CDE 3.50  . CATARACT EXTRACTION W/PHACO Right 04/14/2014   Procedure: CATARACT EXTRACTION PHACO AND INTRAOCULAR LENS PLACEMENT (IOC);  Surgeon: Tonny Branch, MD;  Location: [facility];  Service: Ophthalmology;  Laterality: Right;  CDE 3.35  . COLONOSCOPY  11/2008   Dr. Gala Romney: normal. Next colonoscopy in 2015 due to h/o polyps by Dr. Tamala Julian and path unknown  . COLONOSCOPY N/A 12/12/2014   Procedure: COLONOSCOPY;  Surgeon: Daneil Dolin, MD;  [facility];  Service: Endoscopy;  Laterality: N/A;  1230  . GANGLION CYST EXCISION Left    x2-wrist- [place]  . RIGHT/LEFT HEART CATH AND CORONARY ANGIOGRAPHY N/A 10/11/2019   Procedure: RIGHT/LEFT HEART CATH AND  CORONARY ANGIOGRAPHY;  Surgeon: Wellington Hampshire, MD;  Location: [place] CV LAB;  Service: Cardiovascular;  Laterality: N/A;  . SENTINEL NODE BIOPSY Left 02/21/2019   Procedure: Left Sentinel Lymph Node Mapping;  Surgeon: Erroll Luna, MD;  Location: [place];  Service: General;  Laterality: Left;     OB History   No obstetric history on file.     Family History  Problem Relation Age of Onset  . Breast cancer Sister        diagnosed in her 4s  . Stroke Sister   . Breast cancer Maternal Grandmother        diagnosed in her 54s or 88s  . Breast cancer Sister        diagnosed in her 30s  . Stroke  Mother   . Leukemia Maternal Aunt        diagnosed in her 49s  . Colon cancer Neg Hx     Social History   Tobacco Use  . Smoking status: Never Smoker  . Smokeless tobacco: Never Used  . Tobacco comment: Never smoked  Vaping Use  . Vaping Use: Never used  Substance Use Topics  . Alcohol use: No    Alcohol/week: 0.0 standard drinks  . Drug use: No    Home Medications Prior to Admission medications   Medication Sig Start Date End Date Taking? Authorizing Provider  albuterol (VENTOLIN HFA) 108 (90 Base) MCG/ACT inhaler Inhale 1 puff into the lungs every 4 (four) hours as needed for wheezing or shortness of breath. 05/06/19   [provider]  alendronate (FOSAMAX) 70 MG tablet Take 70 mg by mouth every Sunday.  01/12/19   [provider]  anastrozole (ARIMIDEX) 1 MG tablet Take 1 tablet (1 mg total) by mouth daily. 11/26/19   Nicholas Lose, MD  aspirin 81 MG tablet Take 81 mg by mouth daily.      [provider]  b complex vitamins tablet Take 1 tablet by mouth daily.    [provider]  Biotin 5 MG CAPS Take 5 mg by mouth daily.     [provider]  calcium carbonate (OS-CAL) 600 MG TABS Take 600 mg by mouth daily with breakfast.     [provider]  cholecalciferol (VITAMIN D3) 25 MCG (1000 UT) tablet Take 1,000 Units by mouth daily.     [provider]  loratadine (CLARITIN) 10 MG tablet Take 10 mg by mouth every other day.     [provider]  metoprolol succinate (TOPROL XL) 25 MG 24 hr tablet Take 1.5 tablets (37.5 mg total) by mouth daily. 12/31/19   Arnoldo Lenis, MD  montelukast (SINGULAIR) 10 MG tablet Take 10 mg by mouth at bedtime.    [provider]  Multiple Vitamin (MULTIVITAMIN) capsule Take 1 capsule by mouth daily.      [provider]  OXYGEN Inhale 1.5-2 L into the lungs at bedtime.     [provider]  pantoprazole (PROTONIX) 40 MG tablet Take 40 mg by mouth daily.     [provider]  sacubitril-valsartan (ENTRESTO) 24-26 MG Take 1 tablet by mouth 2 (two) times daily. 09/18/19   Arnoldo Lenis, MD  vitamin E 100 UNIT capsule Take 100 Units by mouth daily.      [provider]  zinc gluconate 50 MG tablet Take 50 mg by mouth daily.    [provider]    Allergies    Hydromet [hydrocodone-homatropine]  Review  of Systems   Review of Systems  Constitutional: Negative for chills and fever.  HENT: Negative for ear pain and sore throat.   Eyes: Negative for pain and visual disturbance.  Respiratory: Positive for cough and shortness of breath.   Cardiovascular: Negative for chest pain and palpitations.  Gastrointestinal: Negative for abdominal pain and vomiting.  Genitourinary: Negative for dysuria and hematuria.  Musculoskeletal: Negative for arthralgias and back pain.  Skin: Negative for color change and rash.  Neurological: Negative for seizures and syncope.  All other systems reviewed and are negative.   Physical Exam Updated Vital Signs BP (!) 156/81   Pulse (!) 113   Temp 98.4 F (36.9 C) (Oral)   Resp (!) 22   Ht 5\' 4"  (1.626 m)   Wt 52.6 kg   SpO2 98%   BMI 19.91 kg/m   Physical Exam Vitals and nursing note reviewed.  Constitutional:      General: She is not in acute distress.    Appearance: She is well-developed and well-nourished. She is not ill-appearing or diaphoretic.     Comments: On 4L [place] w/ sats at 96%   HENT:     Head: Normocephalic and atraumatic.  Eyes:     Conjunctiva/sclera: Conjunctivae normal.  Cardiovascular:     Rate and Rhythm: Normal rate and regular rhythm.     Heart sounds: No murmur heard.   Pulmonary:     Effort: Pulmonary effort is normal. No tachypnea, accessory muscle usage or respiratory distress.     Breath sounds: Examination of the right-upper field reveals wheezing. Examination of the right-middle field reveals wheezing. Wheezing and rhonchi present.  Chest:      Chest wall: No tenderness.  Abdominal:     Palpations: Abdomen is soft.     Tenderness: There is no abdominal tenderness.  Musculoskeletal:        General: No edema. Normal range of motion.     Cervical back: Neck supple.     Right lower leg: No tenderness. No edema.     Left lower leg: No tenderness. No edema.  Skin:    General: Skin is warm and dry.  Neurological:     General: No focal deficit present.     Mental Status: She is alert.  Psychiatric:        Mood and Affect: Mood and affect and mood normal.        Behavior: Behavior normal.     ED Results / Procedures / Treatments   Labs (all labs ordered are listed, but only abnormal results are displayed) Labs Reviewed  COMPREHENSIVE METABOLIC PANEL - Abnormal; Notable for the following components:      Result Value   Glucose, Bld 162 (*)    All other components within normal limits  RESP PANEL BY RT-PCR (FLU A&B, COVID) ARPGX2  CBC WITH DIFFERENTIAL/PLATELET  D-DIMER, QUANTITATIVE (NOT AT [facility])  BRAIN NATRIURETIC PEPTIDE  TROPONIN I (HIGH SENSITIVITY)  TROPONIN I (HIGH SENSITIVITY)    EKG None  Radiology CT Angio Chest PE W and/or Wo Contrast  Result Date: 05/06/2020 CLINICAL DATA:  Worsening shortness of breath and cough x3 days with increased O2 requirement. EXAM: CT ANGIOGRAPHY CHEST WITH CONTRAST TECHNIQUE: Multidetector CT imaging of the chest was performed using the standard protocol during bolus administration of intravenous contrast. Multiplanar CT image reconstructions and MIPs were obtained to evaluate the vascular anatomy. CONTRAST:  19mL OMNIPAQUE IOHEXOL 350 MG/ML SOLN COMPARISON:  Same day chest radiograph and CTA chest May 13, 2019. FINDINGS: Cardiovascular: Satisfactory opacification of the pulmonary arteries to the segmental level. No evidence of pulmonary embolism. Although evaluation of the pulmonary arteries in the lung bases is slightly limited by respiratory motion. Aortic atherosclerosis. Normal  heart size. No pericardial effusion. Mediastinum/Nodes: No enlarged mediastinal, hilar, or axillary lymph nodes. Thyroid gland, trachea, and esophagus demonstrate no significant findings. Lungs/Pleura: Mild basilar predominant bronchial wall thickening with mucoid impaction. Area of ground-glass opacity in the anterior paramedian right upper lobe and anterior left upper lobe. No suspicious pulmonary nodules or masses biapical pulmonary scarring. No pleural effusion or pneumothorax. Upper Abdomen: No acute abnormality. Musculoskeletal: Mild multilevel degenerative changes spine. No suspicious lytic or blastic lesion of bone no acute osseous abnormality. Review of the MIP images confirms the above findings. IMPRESSION: 1. No evidence of pulmonary embolism. 2. Basilar predominant mucoid impaction with mild bronchial wall thickening, suggestive of bronchitis. 3. Area of ground-glass opacity in the anterior paramedian right upper lobe and anterior left upper lobe, which in a similar location to prior CT May 13, 2019 but significantly decreased and may represent an infectious or inflammatory process. 4. Aortic atherosclerosis. Aortic Atherosclerosis (ICD10-I70.0). Electronically Signed   By: Dahlia Bailiff MD   On: 05/06/2020 13:06   DG Chest Portable 1 View  Result Date: 05/06/2020 CLINICAL DATA:  Shortness of breath and chest pain. Cough. COVID-19 positive EXAM: PORTABLE CHEST 1 VIEW COMPARISON:  Chest radiograph and chest CT May 13, 2019 FINDINGS: There is no edema or airspace opacity. Heart size and pulmonary vascularity are normal. No adenopathy. Surgical clips in left breast region noted. No bone lesions. IMPRESSION: No edema or airspace opacity. Heart size normal. Postoperative change left breast. Electronically Signed   By: Lowella Grip III M.D.   On: 05/06/2020 10:41    Procedures Procedures (including critical care time)  Medications Ordered in ED Medications  ipratropium-albuterol  (DUONEB) 0.5-2.5 (3) MG/3ML nebulizer solution 3 mL (3 mLs Nebulization Not Given 05/06/20 1403)  sodium chloride 0.9 % bolus 500 mL (0 mLs Intravenous Stopped 05/06/20 1347)  iohexol (OMNIPAQUE) 350 MG/ML injection 100 mL (75 mLs Intravenous Contrast Given 05/06/20 1245)    ED Course  I have reviewed the triage vital signs and the nursing notes.  Pertinent labs & imaging results that were available during my care of the patient were reviewed by me and considered in my medical decision making (see chart for details).    MDM Rules/Calculators/A&P                          65 year old female presents to the ER with complaints of shortness of breath.  Felt short of breath and hypoxic, so increased nasal cannula to 4 L.  Sats on arrival at 96%.  Consistently tachycardic with a rate between 114-120.  Afebrile here in the ED.  Patient speaking full sentences without increased work of breathing.  No lower extremity edema noted.  Abdomen is soft and nontender.  Lung sounds with scattered wheezes throughout, some rhonchi.  DDx includes pneumonia, COVID-19, PE, malignancy, ACS, heart failure exacerbation  10:15 AM: We will obtain basic lab work, chest x-ray, COVID test, troponin, BNP and D-dimer.  Suspect she will require CT scan of the chest given history of cancer to rule out PE.  Labs ordered, reviewed and interpreted by me -CBC without leukocytosis, normal hemoglobin -CMP without any significant electrode abnormalities, glucose of 162, normal renal and liver function tests -Delta troponins negative -COVID  test/flu negative -BNP normal  Imaging ordered, reviewed and interpreted by me and radiology -Chest x-ray without evidence of pneumonia fluid overload -CT scan of the chest without evidence of PE, possible bronchitis seen  EKG reviewed by myself and my supervising physician -EKG normal sinus tach  MDM: Work-up overall reassuring, negative for PE, pneumonia, COVID-19, heart failure  exacerbation.  Patient does remain tachycardic here in the ER, has received some steroids, received 500 cc of fluids with mild improvement.  DuoNeb treatment not given given elevated heart rate.  She was ambulated by nursing staff on 2 L nasal cannula, sats dropped to 93%, with some shortness of breath.  No evidence of hypoxia.  Her vital signs otherwise are reassuring.  She does not meet admission criteria at this time.  Likely viral syndrome, patient encouraged to increase O2 sats if needed.  Patient was also seen and evaluated by Dr. Almyra Free, discussed her tachycardia, he feels she is stable for discharge despite this.  She has a home pulse oximeter which she was encouraged to use.  Stressed PCP follow-up.  She voiced understanding and is agreeable.  Return precautions discussed.  At this stage in the ED course, the patient is medically screened and stable for discharge.  This was a shared visit with my supervising physician Dr. Almyra Free who independently saw and evaluated the patient & provided guidance in evaluation/management/disposition ,in agreement with care    Final Clinical Impression(s) / ED Diagnoses Final diagnoses:  Upper respiratory tract infection, unspecified type    Rx / DC Orders ED Discharge Orders    None       Lyndel Safe 05/06/20 1506    Luna Fuse, MD 05/12/20 0001

## 2020-05-06 NOTE — Discharge Instructions (Addendum)
Your CT scan of the chest showed a possible bronchitis/upper respiratory infection.  Your COVID test was negative here today.  Overall your work-up was very reassuring.  Please use your oxygen as needed.  Monitor your O2 sats at home with your home pulse oximeter.  It is very important that you follow-up with your primary care doctor in the next few days.  Return to the ER for any new or worsening symptoms

## 2020-05-07 DIAGNOSIS — R0902 Hypoxemia: Secondary | ICD-10-CM | POA: Diagnosis not present

## 2020-05-07 DIAGNOSIS — J449 Chronic obstructive pulmonary disease, unspecified: Secondary | ICD-10-CM | POA: Diagnosis not present

## 2020-05-15 ENCOUNTER — Ambulatory Visit (INDEPENDENT_AMBULATORY_CARE_PROVIDER_SITE_OTHER): Payer: Medicare PPO | Admitting: Otolaryngology

## 2020-05-26 DIAGNOSIS — R0902 Hypoxemia: Secondary | ICD-10-CM | POA: Diagnosis not present

## 2020-05-26 DIAGNOSIS — J9801 Acute bronchospasm: Secondary | ICD-10-CM | POA: Diagnosis not present

## 2020-05-26 DIAGNOSIS — I5022 Chronic systolic (congestive) heart failure: Secondary | ICD-10-CM | POA: Diagnosis not present

## 2020-06-07 DIAGNOSIS — R0902 Hypoxemia: Secondary | ICD-10-CM | POA: Diagnosis not present

## 2020-06-07 DIAGNOSIS — J449 Chronic obstructive pulmonary disease, unspecified: Secondary | ICD-10-CM | POA: Diagnosis not present

## 2020-06-09 ENCOUNTER — Encounter (INDEPENDENT_AMBULATORY_CARE_PROVIDER_SITE_OTHER): Payer: Self-pay | Admitting: Otolaryngology

## 2020-06-09 ENCOUNTER — Other Ambulatory Visit: Payer: Self-pay

## 2020-06-09 ENCOUNTER — Ambulatory Visit (INDEPENDENT_AMBULATORY_CARE_PROVIDER_SITE_OTHER): Payer: Medicare PPO | Admitting: Otolaryngology

## 2020-06-09 VITALS — Temp 97.5°F

## 2020-06-09 DIAGNOSIS — J342 Deviated nasal septum: Secondary | ICD-10-CM | POA: Diagnosis not present

## 2020-06-09 DIAGNOSIS — J329 Chronic sinusitis, unspecified: Secondary | ICD-10-CM

## 2020-06-09 NOTE — Progress Notes (Signed)
HPI: Patricia Nunez is a 65 y.o. female who returns today for evaluation of chronic sinus problems.  Since last visit when I treated her with a round of Augmentin for 10 days and a Sterapred Dosepak for 6 days as well as Nasacort 2 sprays each nostril at night she is doing much better and feeling much better.  She is breathing better through her nose although she still has some obstruction on the right side. I reviewed the CT scan with her in the office today. The CT scan which was performed on 03/27/2020 showed a moderate sinus disease and a severe septal deviation to the right anteriorly.  Past Medical History:  Diagnosis Date  . Cancer [facility])    left breast  . Chronic systolic (congestive) heart failure ([place])    a. EF 30-35% by echo in 08/2019 b. cath in 09/2019 showing normal cors and EF at 40-45%  . Colon polyps   . Family history of breast cancer   . Family history of leukemia   . Hypertension   . Personal history of radiation therapy    Dec 2020- Jan 2021  . PONV (postoperative nausea and vomiting)   . Thyroid disease    h/o hyperactive, just being followed by endocrinologist   Past Surgical History:  Procedure Laterality Date  . ABDOMINAL HYSTERECTOMY  2000  . BREAST LUMPECTOMY Left 2020  . BREAST LUMPECTOMY WITH RADIOACTIVE SEED AND SENTINEL LYMPH NODE BIOPSY Left 02/21/2019   Procedure: LEFT BREAST LUMPECTOMY WITH RADIOACTIVE SEED;  Surgeon: Erroll Luna, MD;  Location: [place];  Service: General;  Laterality: Left;  . CATARACT EXTRACTION W/PHACO Left 03/10/2014   Procedure: CATARACT EXTRACTION PHACO AND INTRAOCULAR LENS PLACEMENT LEFT EYE;  Surgeon: Tonny Branch, MD;  [facility];  Service: Ophthalmology;  Laterality: Left;  CDE 3.50  . CATARACT EXTRACTION W/PHACO Right 04/14/2014   Procedure: CATARACT EXTRACTION PHACO AND INTRAOCULAR LENS PLACEMENT (IOC);  Surgeon: Tonny Branch, MD;  [facility];  Service: Ophthalmology;  Laterality: Right;  CDE 3.35  . COLONOSCOPY   11/2008   Dr. Gala Romney: normal. Next colonoscopy in 2015 due to h/o polyps by Dr. Tamala Julian and path unknown  . COLONOSCOPY N/A 12/12/2014   Procedure: COLONOSCOPY;  Surgeon: Daneil Dolin, MD;  [facility];  Service: Endoscopy;  Laterality: N/A;  1230  . GANGLION CYST EXCISION Left    x2-wrist- [place]  . RIGHT/LEFT HEART CATH AND CORONARY ANGIOGRAPHY N/A 10/11/2019   Procedure: RIGHT/LEFT HEART CATH AND CORONARY ANGIOGRAPHY;  Surgeon: Wellington Hampshire, MD;  Location: [place] [facility];  Service: Cardiovascular;  Laterality: N/A;  . SENTINEL NODE BIOPSY Left 02/21/2019   Procedure: Left Sentinel Lymph Node Mapping;  Surgeon: Erroll Luna, MD;  Location: [place];  Service: General;  Laterality: Left;   Social History   Socioeconomic History  . Marital status: Divorced    Spouse name: Not on file  . Number of children: 1  . Years of education: Not on file  . Highest education level: Not on file  Occupational History  . Occupation: school system  Tobacco Use  . Smoking status: Never Smoker  . Smokeless tobacco: Never Used  . Tobacco comment: Never smoked  Vaping Use  . Vaping Use: Never used  Substance and Sexual Activity  . Alcohol use: No    Alcohol/week: 0.0 standard drinks  . Drug use: No  . Sexual activity: Yes    Birth control/protection: Surgical  Other Topics Concern  . Not  on file  Social History Narrative  . Not on file   Social Determinants of Health   Financial Resource Strain: Not on file  Food Insecurity: Not on file  Transportation Needs: Not on file  Physical Activity: Not on file  Stress: Not on file  Social Connections: Not on file   Family History  Problem Relation Age of Onset  . Breast cancer Sister        diagnosed in her 27s  . Stroke Sister   . Breast cancer Maternal Grandmother        diagnosed in her 70s or 27s  . Breast cancer Sister        diagnosed in her 25s  . Stroke Mother   . Leukemia Maternal Aunt        diagnosed in her 51s   . Colon cancer Neg Hx    Allergies  Allergen Reactions  . Hydromet [Hydrocodone-Homatropine] Nausea And Vomiting   Prior to Admission medications   Medication Sig Start Date End Date Taking? Authorizing Provider  albuterol (VENTOLIN HFA) 108 (90 Base) MCG/ACT inhaler Inhale 1 puff into the lungs every 4 (four) hours as needed for wheezing or shortness of breath. 05/06/19   [provider]  alendronate (FOSAMAX) 70 MG tablet Take 70 mg by mouth every Sunday.  01/12/19   [provider]  anastrozole (ARIMIDEX) 1 MG tablet Take 1 tablet (1 mg total) by mouth daily. 11/26/19   Nicholas Lose, MD  aspirin 81 MG tablet Take 81 mg by mouth daily.      [provider]  b complex vitamins tablet Take 1 tablet by mouth daily.    [provider]  Biotin 5 MG CAPS Take 5 mg by mouth daily.     [provider]  calcium carbonate (OS-CAL) 600 MG TABS Take 600 mg by mouth daily with breakfast.     [provider]  cholecalciferol (VITAMIN D3) 25 MCG (1000 UT) tablet Take 1,000 Units by mouth daily.     [provider]  loratadine (CLARITIN) 10 MG tablet Take 10 mg by mouth every other day.     [provider]  metoprolol succinate (TOPROL XL) 25 MG 24 hr tablet Take 1.5 tablets (37.5 mg total) by mouth daily. 12/31/19   Arnoldo Lenis, MD  montelukast (SINGULAIR) 10 MG tablet Take 10 mg by mouth at bedtime.    [provider]  Multiple Vitamin (MULTIVITAMIN) capsule Take 1 capsule by mouth daily.      [provider]  OXYGEN Inhale 1.5-2 L into the lungs at bedtime.     [provider]  pantoprazole (PROTONIX) 40 MG tablet Take 40 mg by mouth daily.    [provider]  sacubitril-valsartan (ENTRESTO) 24-26 MG Take 1 tablet by mouth 2 (two) times daily. 09/18/19   Arnoldo Lenis, MD  vitamin E 100 UNIT capsule Take 100 Units by mouth daily.      [provider]  zinc gluconate 50 MG tablet  Take 50 mg by mouth daily.    [provider]     Positive ROS: Otherwise negative  All other systems have been reviewed and were otherwise negative with the exception of those mentioned in the HPI and as above.  Physical Exam: Constitutional: Alert, well-appearing, no acute distress Ears: External ears without lesions or tenderness. Ear canals are clear bilaterally with intact, clear TMs.  Nasal: External nose without lesions. Septum is severely deviated to the right.  Mild mucosal edema..  No gross mucopurulent discharge noted. Oral: Lips and gums without lesions. Tongue and palate mucosa without lesions. Posterior oropharynx clear. Neck: No palpable adenopathy or masses Respiratory: Breathing comfortably  Skin: No facial/neck lesions or rash noted.  Procedures  Assessment: Severe septal deviation to the right with more nasal obstruction on the right side. CT scan demonstrated moderate sinus disease although presently patient is feeling much better.  Plan: At this point patient is really not interested in having any surgery performed and is feeling much better. I recommended regular use of the Nasacort 2 sprays each nostril at night and use of saline irrigations as needed. Also gave her a prescription for Augmentin 875 mg twice daily for 10 days if she develops any recurrent acute symptoms.  If she continues to have recurrent sinus infections requiring antibiotics more than 4-5 times a year would recommend proceeding with surgery and briefly discussed this with her.   Radene Journey, MD

## 2020-06-16 ENCOUNTER — Telehealth: Payer: Self-pay | Admitting: Cardiology

## 2020-06-16 MED ORDER — METOPROLOL SUCCINATE ER 25 MG PO TB24: 37.5000 mg | ORAL_TABLET | Freq: Every day | ORAL | 6 refills | Status: DC

## 2020-06-16 NOTE — Telephone Encounter (Signed)
On 12/31/19 medication was increased and the new script reflects that she was give 45 tablets which is 1 1/2 tablets daily as ordered.She would not run out too soon. Refilled anyway

## 2020-06-16 NOTE — Telephone Encounter (Signed)
°*  STAT* If patient is at the pharmacy, call can be transferred to refill team.   1. Which medications need to be refilled? (please list name of each medication and dose if known) metoprolol succinate (TOPROL XL) 25 MG 24 hr tablet  2. Which pharmacy/location (including street and city if local pharmacy) is medication to be sent to? [facility] [place]  3. Do they need a 30 day or 90 day supply? 90 enough to last until her April appointment w/Dr. Harl Bowie  Patient states Bernerd Pho told her to increase from 1 tablet to 1 and 1/2 at her last appointment which has caused her to run out sooner.

## 2020-06-19 ENCOUNTER — Telehealth: Payer: Self-pay | Admitting: *Deleted

## 2020-06-19 DIAGNOSIS — M810 Age-related osteoporosis without current pathological fracture: Secondary | ICD-10-CM | POA: Diagnosis not present

## 2020-06-19 DIAGNOSIS — E039 Hypothyroidism, unspecified: Secondary | ICD-10-CM | POA: Diagnosis not present

## 2020-06-19 DIAGNOSIS — I10 Essential (primary) hypertension: Secondary | ICD-10-CM | POA: Diagnosis not present

## 2020-06-19 NOTE — Telephone Encounter (Signed)
Received letter stating that pt has been approved for the [employer] Patient assistance foundation for the rest of the year.

## 2020-06-26 DIAGNOSIS — E039 Hypothyroidism, unspecified: Secondary | ICD-10-CM | POA: Diagnosis not present

## 2020-06-26 DIAGNOSIS — I10 Essential (primary) hypertension: Secondary | ICD-10-CM | POA: Diagnosis not present

## 2020-06-26 DIAGNOSIS — M810 Age-related osteoporosis without current pathological fracture: Secondary | ICD-10-CM | POA: Diagnosis not present

## 2020-07-05 DIAGNOSIS — R0902 Hypoxemia: Secondary | ICD-10-CM | POA: Diagnosis not present

## 2020-07-05 DIAGNOSIS — J449 Chronic obstructive pulmonary disease, unspecified: Secondary | ICD-10-CM | POA: Diagnosis not present

## 2020-07-07 ENCOUNTER — Telehealth: Payer: Self-pay

## 2020-07-07 MED ORDER — ENTRESTO 24-26 MG PO TABS: 1.0000 | ORAL_TABLET | Freq: Two times a day (BID) | ORAL | 3 refills | Status: DC

## 2020-07-07 NOTE — Telephone Encounter (Signed)
Medication refill request approved and sent to [facility] by [employer] 24-26 mg tablets

## 2020-07-17 ENCOUNTER — Ambulatory Visit: Payer: Medicare PPO | Admitting: Cardiology

## 2020-07-17 ENCOUNTER — Encounter: Payer: Self-pay | Admitting: Cardiology

## 2020-07-17 ENCOUNTER — Other Ambulatory Visit: Payer: Self-pay

## 2020-07-17 VITALS — BP 160/84 | HR 127 | Ht 64.0 in | Wt 114.0 lb

## 2020-07-17 DIAGNOSIS — J40 Bronchitis, not specified as acute or chronic: Secondary | ICD-10-CM

## 2020-07-17 DIAGNOSIS — R000 Tachycardia, unspecified: Secondary | ICD-10-CM | POA: Diagnosis not present

## 2020-07-17 DIAGNOSIS — I5022 Chronic systolic (congestive) heart failure: Secondary | ICD-10-CM

## 2020-07-17 MED ORDER — AZITHROMYCIN 250 MG PO TABS: ORAL_TABLET | ORAL | 0 refills | Status: DC

## 2020-07-17 MED ORDER — PREDNISONE 20 MG PO TABS: ORAL_TABLET | ORAL | 0 refills | Status: DC

## 2020-07-17 NOTE — Patient Instructions (Signed)
Medication Instructions:  Take Prednisone 40 mg daily for 5 days   Take Azithromycin 500 mg on day 1 and the 250 mg daily for 4 days after, then stop.   *If you need a refill on your cardiac medications before your next appointment, please call your pharmacy*   Lab Work: None today If you have labs (blood work) drawn today and your tests are completely normal, you will receive your results only by: Marland Kitchen MyChart Message (if you have MyChart) OR . A paper copy in the mail If you have any lab test that is abnormal or we need to change your treatment, we will call you to review the results.   Testing/Procedures: None today   Follow-Up: At [facility], you and your health needs are our priority.  As part of our continuing mission to provide you with exceptional heart care, we have created designated Provider Care Teams.  These Care Teams include your primary Cardiologist (physician) and Advanced Practice Providers (APPs -  Physician Assistants and Nurse Practitioners) who all work together to provide you with the care you need, when you need it.  We recommend signing up for the patient portal called "MyChart".  Sign up information is provided on this After Visit Summary.  MyChart is used to connect with patients for Virtual Visits (Telemedicine).  Patients are able to view lab/test results, encounter notes, upcoming appointments, etc.  Non-urgent messages can be sent to your provider as well.   To learn more about what you can do with MyChart, go to [email or web address].    Your next appointment:   6 month(s)  The format for your next appointment:   In Person  Provider:   Carlyle Dolly, MD   Other Instructions None      Thank you for choosing [facility] !

## 2020-07-17 NOTE — Progress Notes (Signed)
Clinical Summary Ms. Benedict is a 65 y.o.female seen today for follow up of the following medical problems.   1. Chronic systolic HF  - 12/8919 echo LVEF 30-35%, Normal RV function - 09/2019 cath: normal coronaries. Mean PA 12, PCWP 1, LVEDP 1, CI 3.4  - completed assistance for entresto - no SOB or DOE, no recent edema  02/2020 echo LVEF 45-50%.  - no recent edema. Some recent SOB, productive cough, wheezing    2. SOB - +cough productive, wheezing.  - issues with chronic sinusitis - SOB improved with inhaler.  - ongoing issues since December  3. Chronic Tachycardia - can have some palpitations with high levels of activitybut not at rest - reports his was an issue since her admission Jan 2021 with pneumona - CT PE at that time was negative.  - home HRs 90s to 110  4. Nocturnal hypoxia - wears O2 at night, followed by pcp  5. Breast cancer - s/p lumpectomy - has had radiation treatment   6. Hyperthyroid - followed by endo   Past Medical History:  Diagnosis Date  . Cancer [facility])    left breast  . Chronic systolic (congestive) heart failure ([place])    a. EF 30-35% by echo in 08/2019 b. cath in 09/2019 showing normal cors and EF at 40-45%  . Colon polyps   . Family history of breast cancer   . Family history of leukemia   . Hypertension   . Personal history of radiation therapy    Dec 2020- Jan 2021  . PONV (postoperative nausea and vomiting)   . Thyroid disease    h/o hyperactive, just being followed by endocrinologist     Allergies  Allergen Reactions  . Hydromet [Hydrocodone-Homatropine] Nausea And Vomiting     Current Outpatient Medications  Medication Sig Dispense Refill  . albuterol (VENTOLIN HFA) 108 (90 Base) MCG/ACT inhaler Inhale 1 puff into the lungs every 4 (four) hours as needed for wheezing or shortness of breath.    Marland Kitchen alendronate (FOSAMAX) 70 MG tablet Take 70 mg by mouth every Sunday.     Marland Kitchen anastrozole (ARIMIDEX) 1 MG  tablet Take 1 tablet (1 mg total) by mouth daily. 90 tablet 3  . aspirin 81 MG tablet Take 81 mg by mouth daily.      Marland Kitchen b complex vitamins tablet Take 1 tablet by mouth daily.    . Biotin 5 MG CAPS Take 5 mg by mouth daily.     . calcium carbonate (OS-CAL) 600 MG TABS Take 600 mg by mouth daily with breakfast.     . cholecalciferol (VITAMIN D3) 25 MCG (1000 UT) tablet Take 1,000 Units by mouth daily.     Marland Kitchen loratadine (CLARITIN) 10 MG tablet Take 10 mg by mouth every other day.     . metoprolol succinate (TOPROL XL) 25 MG 24 hr tablet Take 1.5 tablets (37.5 mg total) by mouth daily. 45 tablet 6  . montelukast (SINGULAIR) 10 MG tablet Take 10 mg by mouth at bedtime.    . Multiple Vitamin (MULTIVITAMIN) capsule Take 1 capsule by mouth daily.      . OXYGEN Inhale 1.5-2 L into the lungs at bedtime.     . pantoprazole (PROTONIX) 40 MG tablet Take 40 mg by mouth daily.    . sacubitril-valsartan (ENTRESTO) 24-26 MG Take 1 tablet by mouth 2 (two) times daily. 180 tablet 3  . vitamin E 100 UNIT capsule Take 100 Units by mouth daily.      Marland Kitchen  zinc gluconate 50 MG tablet Take 50 mg by mouth daily.     No current facility-administered medications for this visit.     Past Surgical History:  Procedure Laterality Date  . ABDOMINAL HYSTERECTOMY  2000  . BREAST LUMPECTOMY Left 2020  . BREAST LUMPECTOMY WITH RADIOACTIVE SEED AND SENTINEL LYMPH NODE BIOPSY Left 02/21/2019   Procedure: LEFT BREAST LUMPECTOMY WITH RADIOACTIVE SEED;  Surgeon: Erroll Luna, MD;  Location: [facility];  Service: General;  Laterality: Left;  . CATARACT EXTRACTION W/PHACO Left 03/10/2014   Procedure: CATARACT EXTRACTION PHACO AND INTRAOCULAR LENS PLACEMENT LEFT EYE;  Surgeon: Tonny Charlett Merkle, MD;  Location: [facility];  Service: Ophthalmology;  Laterality: Left;  CDE 3.50  . CATARACT EXTRACTION W/PHACO Right 04/14/2014   Procedure: CATARACT EXTRACTION PHACO AND INTRAOCULAR LENS PLACEMENT (IOC);  Surgeon: Tonny Saleemah Mollenhauer, MD;  Location: [facility];   Service: Ophthalmology;  Laterality: Right;  CDE 3.35  . COLONOSCOPY  11/2008   Dr. Gala Romney: normal. Next colonoscopy in 2015 due to h/o polyps by Dr. Tamala Julian and path unknown  . COLONOSCOPY N/A 12/12/2014   Procedure: COLONOSCOPY;  Surgeon: Daneil Dolin, MD;  Location: [facility];  Service: Endoscopy;  Laterality: N/A;  1230  . GANGLION CYST EXCISION Left    x2-wrist- [place]  . RIGHT/LEFT HEART CATH AND CORONARY ANGIOGRAPHY N/A 10/11/2019   Procedure: RIGHT/LEFT HEART CATH AND CORONARY ANGIOGRAPHY;  Surgeon: Wellington Hampshire, MD;  Location: [facility];  Service: Cardiovascular;  Laterality: N/A;  . SENTINEL NODE BIOPSY Left 02/21/2019   Procedure: Left Sentinel Lymph Node Mapping;  Surgeon: Erroll Luna, MD;  Location: [facility];  Service: General;  Laterality: Left;     Allergies  Allergen Reactions  . Hydromet [Hydrocodone-Homatropine] Nausea And Vomiting      Family History  Problem Relation Age of Onset  . Breast cancer Sister        diagnosed in her 67s  . Stroke Sister   . Breast cancer Maternal Grandmother        diagnosed in her 43s or 23s  . Breast cancer Sister        diagnosed in her 59s  . Stroke Mother   . Leukemia Maternal Aunt        diagnosed in her 82s  . Colon cancer Neg Hx      Social History Ms. Clites reports that she has never smoked. She has never used smokeless tobacco. Ms. Bias reports no history of alcohol use.   Review of Systems CONSTITUTIONAL: No weight loss, fever, chills, weakness or fatigue.  HEENT: Eyes: No visual loss, blurred vision, double vision or yellow sclerae.No hearing loss, sneezing, congestion, runny nose or sore throat.  SKIN: No rash or itching.  CARDIOVASCULAR: per hpi RESPIRATORY: per hpi.  GASTROINTESTINAL: No anorexia, nausea, vomiting or diarrhea. No abdominal pain or blood.  GENITOURINARY: No burning on urination, no polyuria NEUROLOGICAL: No headache, dizziness, syncope, paralysis, ataxia, numbness or  tingling in the extremities. No change in bowel or bladder control.  MUSCULOSKELETAL: No muscle, back pain, joint pain or stiffness.  LYMPHATICS: No enlarged nodes. No history of splenectomy.  PSYCHIATRIC: No history of depression or anxiety.  ENDOCRINOLOGIC: No reports of sweating, cold or heat intolerance. No polyuria or polydipsia.  Marland Kitchen   Physical Examination Today's Vitals   07/17/20 0955  BP: (!) 160/84  Pulse: (!) 127  SpO2: 90%  Weight: 114 lb (51.7 kg)  Height: 5\' 4"  (1.626 m)   Body mass index is  19.57 kg/m.  Gen: resting comfortably, no acute distress HEENT: no scleral icterus, pupils equal round and reactive, no palptable cervical adenopathy,  CV: RRR, no m/r/g, no jvd Resp:bilaletaral expiratory coarse wheezing.  GI: abdomen is soft, non-tender, non-distended, normal bowel sounds, no hepatosplenomegaly MSK: extremities are warm, no edema.  Skin: warm, no rash Neuro:  no focal deficits Psych: appropriate affect   Diagnostic Studies 08/2019 echo IMPRESSIONS    1. Left ventricular ejection fraction, by estimation, is 30 to 35%. The  left ventricle has moderately decreased function. The left ventricle  demonstrates global hypokinesis with some regional variation. Left  ventricular diastolic parameters are  indeterminate. Patient appeared to be in sinus tachycardia throughout the  study.  2. Right ventricular systolic function is normal. The right ventricular  size is normal. There is normal pulmonary artery systolic pressure. The  estimated right ventricular systolic pressure is 93.8 mmHg.  3. The mitral valve is grossly normal. Trivial mitral valve  regurgitation.  4. The aortic valve is tricuspid. Aortic valve regurgitation is not  visualized. Mild aortic valve sclerosis is present, with no evidence of  aortic valve stenosis.  5. The inferior vena cava is normal in size with greater than 50%  respiratory variability, suggesting right atrial pressure of 3  mmHg.    02/2020 echo IMPRESSIONS    1. Left ventricular ejection fraction, by estimation, is 45 to 50%. The  left ventricle has mildly decreased function. The left ventricle  demonstrates global hypokinesis. Left ventricular diastolic parameters  were normal.  2. Right ventricular systolic function is normal. The right ventricular  size is normal. There is normal pulmonary artery systolic pressure. The  estimated right ventricular systolic pressure is 10.1 mmHg.  3. The mitral valve is grossly normal. Trivial mitral valve  regurgitation.  4. The aortic valve is tricuspid. There is mild calcification of the  aortic valve. Aortic valve regurgitation is not visualized.  5. The inferior vena cava is normal in size with greater than 50%  respiratory variability, suggesting right atrial pressure of 3 mmHg.    Assessment and Plan  1. Chronic systolic HF - LVEF has nearly normalized, appears euovlemic - continue current meds  2. Cough/SOB/Bronchitis - ongoing productive cough, wheezing - will give 5 day course of prednsione 40mg , zpack - she has f/u with pcp on Tuesday - this looks to be the cause of her elevated blood pressure. She has chronic sinus tach, rates higher today with her symptoms.     Arnoldo Lenis, M.D

## 2020-07-21 DIAGNOSIS — J9801 Acute bronchospasm: Secondary | ICD-10-CM | POA: Diagnosis not present

## 2020-07-21 DIAGNOSIS — J411 Mucopurulent chronic bronchitis: Secondary | ICD-10-CM | POA: Diagnosis not present

## 2020-07-21 DIAGNOSIS — R0902 Hypoxemia: Secondary | ICD-10-CM | POA: Diagnosis not present

## 2020-07-21 DIAGNOSIS — I10 Essential (primary) hypertension: Secondary | ICD-10-CM | POA: Diagnosis not present

## 2020-07-23 DIAGNOSIS — J411 Mucopurulent chronic bronchitis: Secondary | ICD-10-CM | POA: Diagnosis not present

## 2020-07-23 DIAGNOSIS — R0902 Hypoxemia: Secondary | ICD-10-CM | POA: Diagnosis not present

## 2020-07-23 DIAGNOSIS — J9801 Acute bronchospasm: Secondary | ICD-10-CM | POA: Diagnosis not present

## 2020-07-23 DIAGNOSIS — J449 Chronic obstructive pulmonary disease, unspecified: Secondary | ICD-10-CM | POA: Diagnosis not present

## 2020-07-27 DIAGNOSIS — J449 Chronic obstructive pulmonary disease, unspecified: Secondary | ICD-10-CM | POA: Diagnosis not present

## 2020-07-27 DIAGNOSIS — R0902 Hypoxemia: Secondary | ICD-10-CM | POA: Diagnosis not present

## 2020-07-28 DIAGNOSIS — J411 Mucopurulent chronic bronchitis: Secondary | ICD-10-CM | POA: Diagnosis not present

## 2020-08-05 DIAGNOSIS — J449 Chronic obstructive pulmonary disease, unspecified: Secondary | ICD-10-CM | POA: Diagnosis not present

## 2020-08-05 DIAGNOSIS — R0902 Hypoxemia: Secondary | ICD-10-CM | POA: Diagnosis not present

## 2020-08-22 DIAGNOSIS — J449 Chronic obstructive pulmonary disease, unspecified: Secondary | ICD-10-CM | POA: Diagnosis not present

## 2020-08-22 DIAGNOSIS — R0902 Hypoxemia: Secondary | ICD-10-CM | POA: Diagnosis not present

## 2020-08-22 DIAGNOSIS — J411 Mucopurulent chronic bronchitis: Secondary | ICD-10-CM | POA: Diagnosis not present

## 2020-08-22 DIAGNOSIS — J9801 Acute bronchospasm: Secondary | ICD-10-CM | POA: Diagnosis not present

## 2020-08-25 DIAGNOSIS — C50912 Malignant neoplasm of unspecified site of left female breast: Secondary | ICD-10-CM | POA: Diagnosis not present

## 2020-08-26 DIAGNOSIS — R0902 Hypoxemia: Secondary | ICD-10-CM | POA: Diagnosis not present

## 2020-08-26 DIAGNOSIS — J449 Chronic obstructive pulmonary disease, unspecified: Secondary | ICD-10-CM | POA: Diagnosis not present

## 2020-09-04 DIAGNOSIS — R0902 Hypoxemia: Secondary | ICD-10-CM | POA: Diagnosis not present

## 2020-09-04 DIAGNOSIS — J449 Chronic obstructive pulmonary disease, unspecified: Secondary | ICD-10-CM | POA: Diagnosis not present

## 2020-09-15 ENCOUNTER — Other Ambulatory Visit: Payer: Self-pay | Admitting: Adult Health

## 2020-09-15 DIAGNOSIS — Z9889 Other specified postprocedural states: Secondary | ICD-10-CM

## 2020-09-22 DIAGNOSIS — J9801 Acute bronchospasm: Secondary | ICD-10-CM | POA: Diagnosis not present

## 2020-09-22 DIAGNOSIS — J449 Chronic obstructive pulmonary disease, unspecified: Secondary | ICD-10-CM | POA: Diagnosis not present

## 2020-09-22 DIAGNOSIS — J411 Mucopurulent chronic bronchitis: Secondary | ICD-10-CM | POA: Diagnosis not present

## 2020-09-22 DIAGNOSIS — R0902 Hypoxemia: Secondary | ICD-10-CM | POA: Diagnosis not present

## 2020-09-26 DIAGNOSIS — R0902 Hypoxemia: Secondary | ICD-10-CM | POA: Diagnosis not present

## 2020-09-26 DIAGNOSIS — J449 Chronic obstructive pulmonary disease, unspecified: Secondary | ICD-10-CM | POA: Diagnosis not present

## 2020-09-28 DIAGNOSIS — J411 Mucopurulent chronic bronchitis: Secondary | ICD-10-CM | POA: Diagnosis not present

## 2020-09-28 DIAGNOSIS — J9801 Acute bronchospasm: Secondary | ICD-10-CM | POA: Diagnosis not present

## 2020-09-28 DIAGNOSIS — J301 Allergic rhinitis due to pollen: Secondary | ICD-10-CM | POA: Diagnosis not present

## 2020-10-05 DIAGNOSIS — J449 Chronic obstructive pulmonary disease, unspecified: Secondary | ICD-10-CM | POA: Diagnosis not present

## 2020-10-05 DIAGNOSIS — R0902 Hypoxemia: Secondary | ICD-10-CM | POA: Diagnosis not present

## 2020-10-12 DIAGNOSIS — J301 Allergic rhinitis due to pollen: Secondary | ICD-10-CM | POA: Diagnosis not present

## 2020-10-12 DIAGNOSIS — I5022 Chronic systolic (congestive) heart failure: Secondary | ICD-10-CM | POA: Diagnosis not present

## 2020-10-12 DIAGNOSIS — J9801 Acute bronchospasm: Secondary | ICD-10-CM | POA: Diagnosis not present

## 2020-10-12 DIAGNOSIS — J410 Simple chronic bronchitis: Secondary | ICD-10-CM | POA: Diagnosis not present

## 2020-10-22 DIAGNOSIS — J449 Chronic obstructive pulmonary disease, unspecified: Secondary | ICD-10-CM | POA: Diagnosis not present

## 2020-10-22 DIAGNOSIS — R0902 Hypoxemia: Secondary | ICD-10-CM | POA: Diagnosis not present

## 2020-10-22 DIAGNOSIS — J411 Mucopurulent chronic bronchitis: Secondary | ICD-10-CM | POA: Diagnosis not present

## 2020-10-22 DIAGNOSIS — J9801 Acute bronchospasm: Secondary | ICD-10-CM | POA: Diagnosis not present

## 2020-10-26 ENCOUNTER — Ambulatory Visit
Admission: RE | Admit: 2020-10-26 | Discharge: 2020-10-26 | Disposition: A | Discharge status: Home / Self Care | Payer: Medicare PPO | Source: Ambulatory Visit | Attending: Adult Health | Admitting: Adult Health

## 2020-10-26 ENCOUNTER — Other Ambulatory Visit: Payer: Self-pay

## 2020-10-26 DIAGNOSIS — Z9889 Other specified postprocedural states: Secondary | ICD-10-CM

## 2020-10-26 DIAGNOSIS — J449 Chronic obstructive pulmonary disease, unspecified: Secondary | ICD-10-CM | POA: Diagnosis not present

## 2020-10-26 DIAGNOSIS — Z853 Personal history of malignant neoplasm of breast: Secondary | ICD-10-CM | POA: Diagnosis not present

## 2020-10-26 DIAGNOSIS — R922 Inconclusive mammogram: Secondary | ICD-10-CM | POA: Diagnosis not present

## 2020-10-26 DIAGNOSIS — R0902 Hypoxemia: Secondary | ICD-10-CM | POA: Diagnosis not present

## 2020-10-27 DIAGNOSIS — J410 Simple chronic bronchitis: Secondary | ICD-10-CM | POA: Diagnosis not present

## 2020-10-27 DIAGNOSIS — I110 Hypertensive heart disease with heart failure: Secondary | ICD-10-CM | POA: Diagnosis not present

## 2020-10-27 DIAGNOSIS — J301 Allergic rhinitis due to pollen: Secondary | ICD-10-CM | POA: Diagnosis not present

## 2020-10-27 DIAGNOSIS — I5022 Chronic systolic (congestive) heart failure: Secondary | ICD-10-CM | POA: Diagnosis not present

## 2020-10-28 IMAGING — CT CT ANGIO CHEST
2 of 6 series · 18 of 36 positions shown · IV contrast (OMNIPAQUE 350)
Comparison: None.

CLINICAL DATA: Shortness of breath with exertion x1 week.

EXAM:
CT ANGIOGRAPHY CHEST WITH CONTRAST
TECHNIQUE: Multidetector CT imaging of the chest was performed using the
standard protocol during bolus administration of intravenous
contrast. Multiplanar CT image reconstructions and MIPs were
obtained to evaluate the vascular anatomy.
CONTRAST:  100mL OMNIPAQUE IOHEXOL 350 MG/ML SOLN

[Series 7: thins · axial · 0.64mm/px · z∈[+1486,+1754]mm · 17 of 301 slices shown]
[im 17/301  lung]
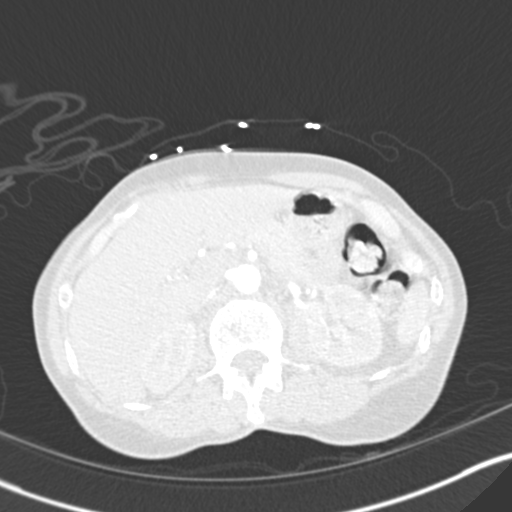
[im 34/301  mediastinal]
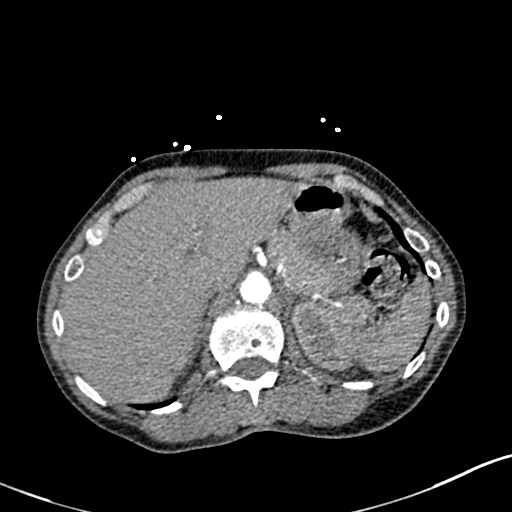
[im 51/301  lung]
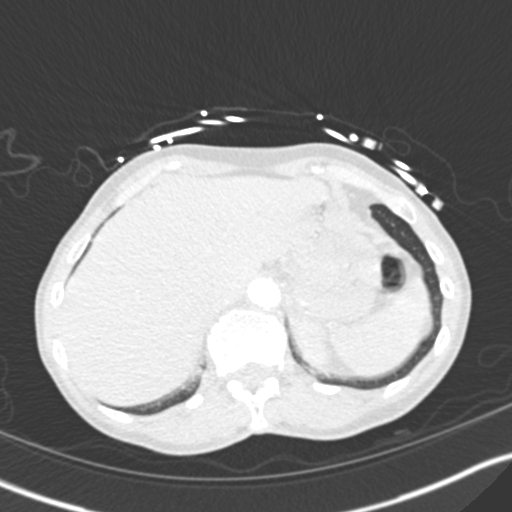
[im 67/301  mediastinal]
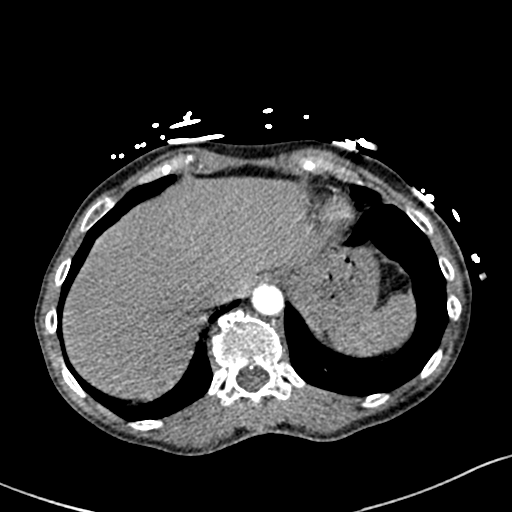
[im 84/301  lung]
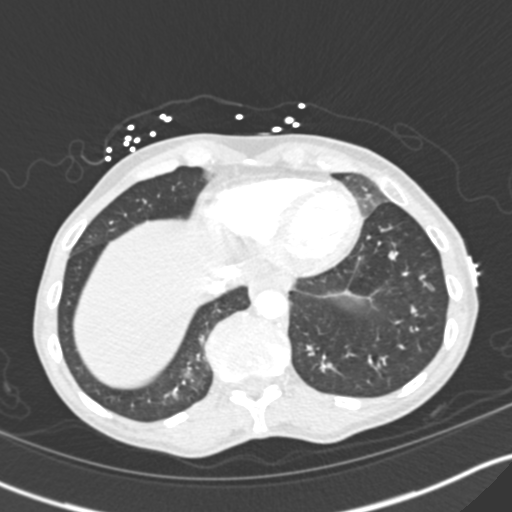
[im 101/301  mediastinal]
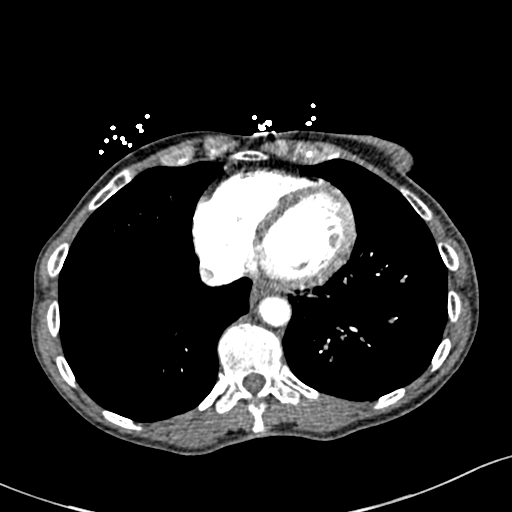
[im 117/301  lung]
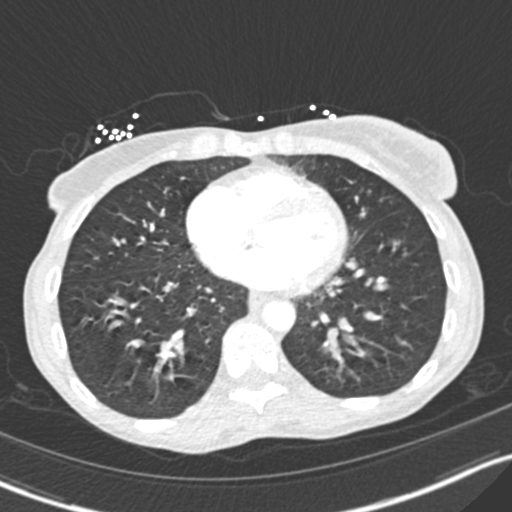
[im 134/301  mediastinal]
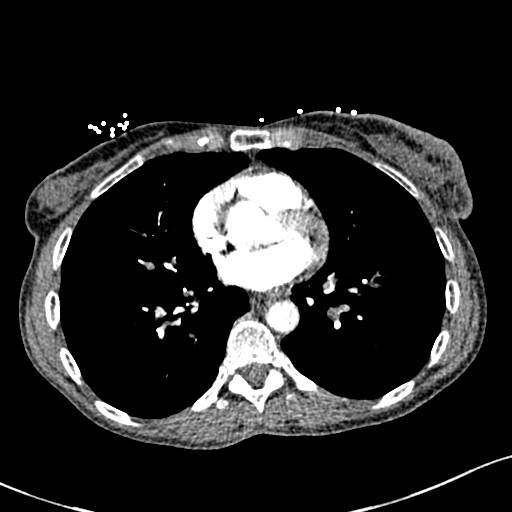
[im 151/301  lung]
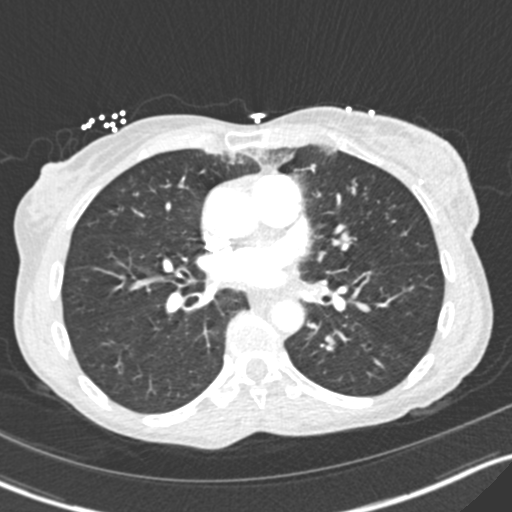
[im 167/301  mediastinal]
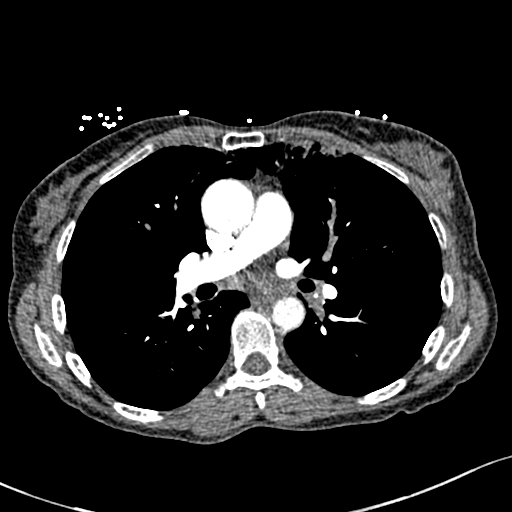
[im 184/301  lung]
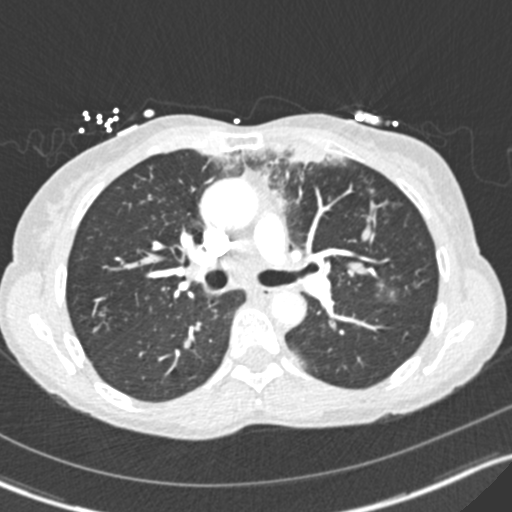
[im 201/301  mediastinal]
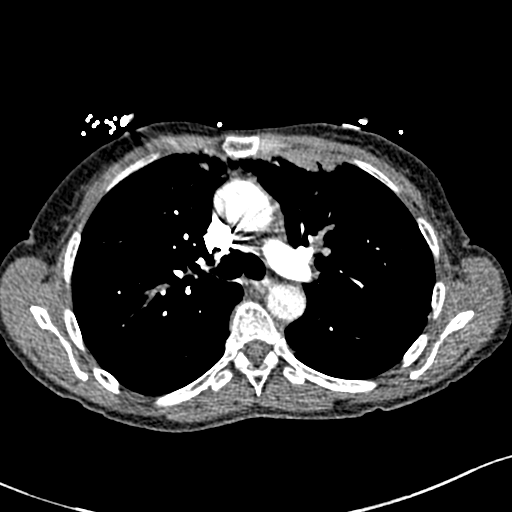
[im 217/301  lung]
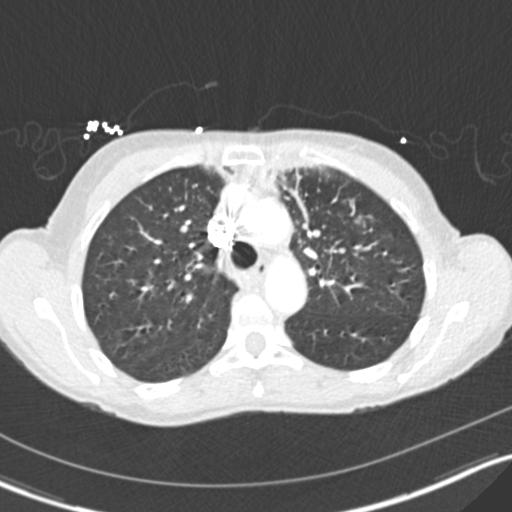
[im 234/301  mediastinal]
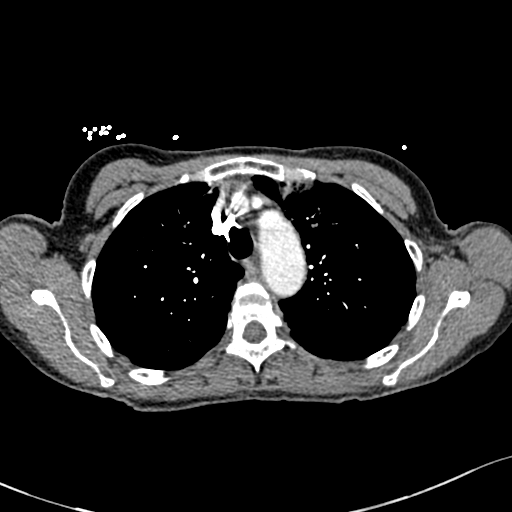
[im 251/301  lung]
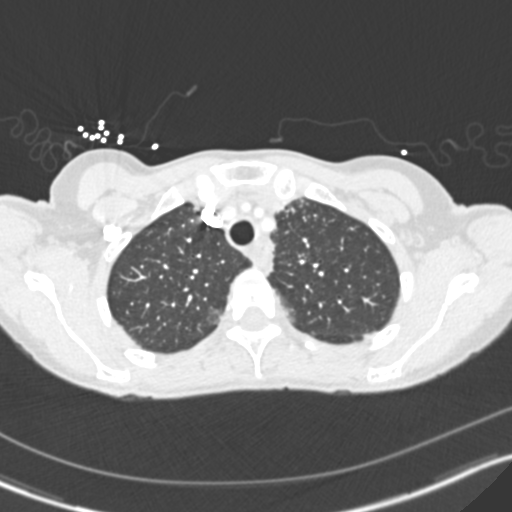
[im 267/301  mediastinal]
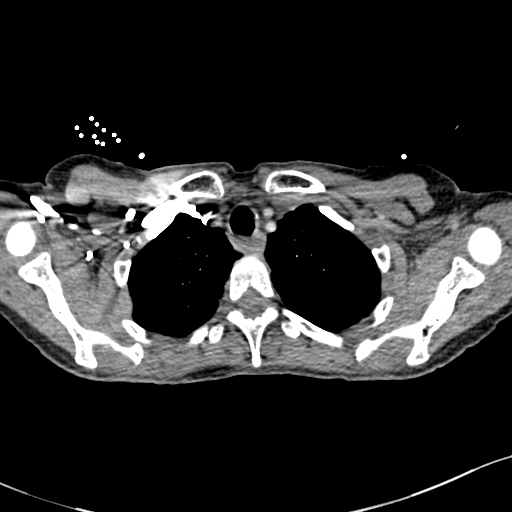
[im 284/301  lung]
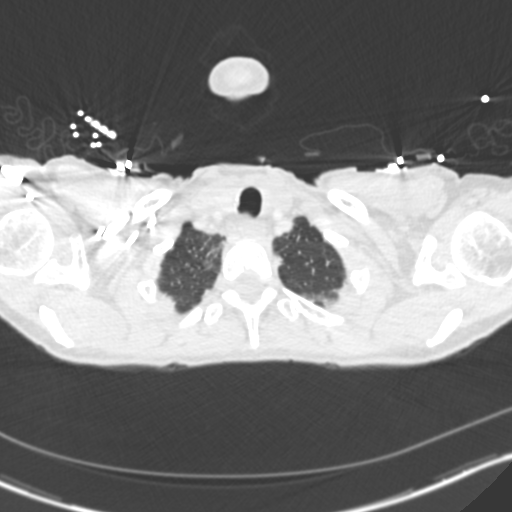

[Series 9: coronal mpr · coronal · 0.53mm/px · 1 of 99 slices shown]
[im 50/99  mediastinal]
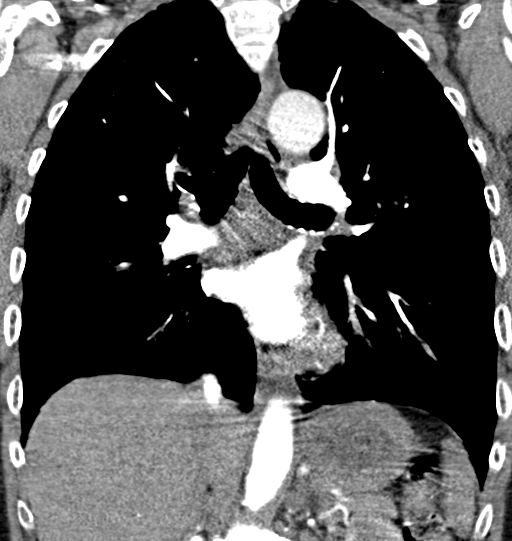

[18 of 36 positions shown; findings below may reference images not displayed]

FINDINGS: Cardiovascular: There is mild calcification of the aortic arch.
Satisfactory opacification of the pulmonary arteries to the
segmental level. No evidence of pulmonary embolism. Normal heart
size. No pericardial effusion.

Mediastinum/Nodes: No enlarged mediastinal, hilar, or axillary lymph
nodes. Thyroid gland, trachea, and esophagus demonstrate no
significant findings.

Lungs/Pleura: Mild bilateral predominantly anteromedial upper lobe
infiltrates are seen, left greater than right.

There is no evidence of a pleural effusion or pneumothorax.

Upper Abdomen: No acute abnormality.

Musculoskeletal: No chest wall abnormality. No acute or significant
osseous findings.

Review of the MIP images confirms the above findings.
IMPRESSION: 1. Mild bilateral upper lobe infiltrates, left greater than right.

## 2020-11-04 DIAGNOSIS — J449 Chronic obstructive pulmonary disease, unspecified: Secondary | ICD-10-CM | POA: Diagnosis not present

## 2020-11-04 DIAGNOSIS — R0902 Hypoxemia: Secondary | ICD-10-CM | POA: Diagnosis not present

## 2020-11-22 DIAGNOSIS — J9801 Acute bronchospasm: Secondary | ICD-10-CM | POA: Diagnosis not present

## 2020-11-22 DIAGNOSIS — J411 Mucopurulent chronic bronchitis: Secondary | ICD-10-CM | POA: Diagnosis not present

## 2020-11-22 DIAGNOSIS — J449 Chronic obstructive pulmonary disease, unspecified: Secondary | ICD-10-CM | POA: Diagnosis not present

## 2020-11-22 DIAGNOSIS — R0902 Hypoxemia: Secondary | ICD-10-CM | POA: Diagnosis not present

## 2020-11-24 DIAGNOSIS — J301 Allergic rhinitis due to pollen: Secondary | ICD-10-CM | POA: Diagnosis not present

## 2020-11-24 NOTE — Assessment & Plan Note (Signed)
01/22/2019:Patient palpated a left breast mass. Mammogram showed a 0.9cm mass in the left breast at the 3 o'clock position, no left axillary adenopathy. 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 T1BN0 stage Ia  Recommendations: 1. 02/21/2019: Left lumpectomy: Grade 2 IDC 0.9 cm with clear margins, 0/2 lymph nodes negative, ER 95%, PR 95%, Ki-67 15%, HER-2 negative, T1bN0 stage Ia 2. Oncotype DX: Recurrence score 3, distant recurrence at 9 years: 3% 3. Adjuvant radiation therapy12/15/2020-04/30/2019 4. Adjuvant antiestrogen therapywith anastrozole 1 mg daily started January 2021 5.Genetics consult ------------------------------------------------------------------------------------------------------------------------------------------------- Anastrozole toxicities: Denies any adverse effects.  She has mild hot flashes and joint stiffness from before.  These are unchanged.  Breast cancer surveillance: 1.  Breast exam 11/25/2020: Benign 2. Mammogram 10/26/2020: Benign breast density category C  Heart catheterization 10/11/2019: LV dysfunction EF 40% Chronic dry cough: Patient wants to see her primary care doctor and discuss an ENT referral

## 2020-11-24 NOTE — Progress Notes (Signed)
Patient Care Team: Iona Beard, MD as PCP - General (Family Medicine) Harl Bowie, Alphonse Guild, MD as PCP - Cardiology (Cardiology) Nicholas Lose, MD as Consulting Physician (Hematology and Oncology) Gery Pray, MD as Consulting Physician (Radiation Oncology) Erroll Luna, MD as Consulting Physician (General Surgery) Jacelyn Pi, MD as Referring Physician (Endocrinology)  DIAGNOSIS:    ICD-10-CM   1. Malignant neoplasm of upper-outer quadrant of left breast in female, estrogen receptor positive ([place])  C50.412    Z17.0       SUMMARY OF ONCOLOGIC HISTORY: Oncology History  Malignant neoplasm of upper-outer quadrant of left breast in female, estrogen receptor positive ([place])  01/22/2019 Initial Diagnosis   Patient palpated a left breast mass. Mammogram showed a 0.9cm mass in the left breast at the 3 o'clock position, no left axillary adenopathy. 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    02/07/2019 Genetic Testing   Negative genetic testing: no pathogenic variants identified on the [facility] Breast Cancer STAT panel + Common Hereditary Cancers panel. 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 The report date is 02/07/2019.  The STAT Breast cancer panel offered by [facility] includes sequencing and rearrangement analysis for the following 9 genes:  ATM, BRCA1, BRCA2, CDH1, CHEK2, PALB2, PTEN, STK11 and TP53.  The Common Hereditary Cancers Panel offered by [facility] includes sequencing and/or deletion duplication testing of the following 47 genes: APC, ATM, AXIN2, BARD1, BMPR1A, BRCA1, BRCA2, BRIP1, CDH1, CDK4, CDKN2A (p14ARF), CDKN2A (p16INK4a), CHEK2, CTNNA1, DICER1, EPCAM (Deletion/duplication testing only), GREM1 (promoter region deletion/duplication testing only), KIT, MEN1, MLH1, MSH2, MSH3, MSH6, MUTYH, NBN, NF1, NHTL1, PALB2, PDGFRA, PMS2, POLD1, POLE, PTEN, RAD50, RAD51C, RAD51D, SDHB, SDHC, SDHD, SMAD4, SMARCA4.  STK11, TP53, TSC1, TSC2, and VHL.  The following genes were evaluated for sequence changes only: SDHA and HOXB13 c.251G>A variant only.   02/21/2019 Surgery   Left lumpectomy ([place]) ([id number]): IDC, 0.9cm, grade 2, clear margins, 2 left axillary lymph nodes negative.   02/21/2019 Cancer Staging   Staging form: Breast, AJCC 8th Edition - Pathologic stage from 02/21/2019: Stage IA (pT1b, pN0, cM0, G2, ER+, PR+, HER2-)    03/04/2019 Oncotype testing   The Oncotype DX score was 3 predicting a risk of outside the breast recurrence over the next 9 years of 3% if the patient's only systemic therapy is tamoxifen for 5 years.     04/01/2019 - 04/30/2019 Radiation Therapy   The patient initially received a dose of 40.05 Gy in 15 fractions to the breast using whole-breast tangent fields. This was delivered using a 3-D conformal technique. The pt received a boost delivering an additional 10 Gy in 5 fractions using a electron boost with 55mV electrons. The total dose was 50.05 Gy.    04/2019 - 04/2024 Anti-estrogen oral therapy   Anastrozole     CHIEF COMPLIANT: Follow-up of left breast cancer on anastrozole  INTERVAL HISTORY: Patricia BUCCIERIis a 65y.o. with above-mentioned history of left breast cancer who underwent a lumpectomy, radiation, and is currently on antiestrogen therapy with anastrozole. Mammogram on 10/26/20 showed no evidence of malignancy bilaterally. She presents to the clinic today for follow-up.  She is tolerating anastrozole extremely well.  She denies any hot flashes.  Does have some joint stiffness which she had even before starting antiestrogen therapy.  He is unchanged.  Denies any lumps or nodules in the breast.  She has slight difficulty with moving her arms behind her  back.  ALLERGIES:  is allergic to hydromet [hydrocodone bit-homatrop mbr].  MEDICATIONS:  Current Outpatient Medications  Medication Sig Dispense Refill   albuterol (VENTOLIN HFA) 108 (90 Base) MCG/ACT  inhaler Inhale 1 puff into the lungs every 4 (four) hours as needed for wheezing or shortness of breath.     alendronate (FOSAMAX) 70 MG tablet Take 70 mg by mouth every Sunday.      anastrozole (ARIMIDEX) 1 MG tablet Take 1 tablet (1 mg total) by mouth daily. 90 tablet 3   aspirin 81 MG tablet Take 81 mg by mouth daily.     azithromycin (ZITHROMAX Z-PAK) 250 MG tablet Take 500 mg (2 tablets) on day 1 and then 1 tablet 250 mg daily for 4 days and then stop 6 each 0   b complex vitamins tablet Take 1 tablet by mouth daily.     calcium carbonate (OS-CAL) 600 MG TABS Take 600 mg by mouth daily with breakfast.     cholecalciferol (VITAMIN D3) 25 MCG (1000 UT) tablet Take 1,000 Units by mouth daily.     loratadine (CLARITIN) 10 MG tablet Take 10 mg by mouth every other day.     metoprolol succinate (TOPROL XL) 25 MG 24 hr tablet Take 1.5 tablets (37.5 mg total) by mouth daily. 45 tablet 6   montelukast (SINGULAIR) 10 MG tablet Take 10 mg by mouth at bedtime.     Multiple Vitamin (MULTIVITAMIN) capsule Take 1 capsule by mouth daily.     OXYGEN Inhale 1.5-2 L into the lungs at bedtime.      pantoprazole (PROTONIX) 40 MG tablet Take 40 mg by mouth daily.     predniSONE (DELTASONE) 20 MG tablet Take 40 mg (2 tablets) daily for 5 days the stop 10 tablet 0   sacubitril-valsartan (ENTRESTO) 24-26 MG Take 1 tablet by mouth 2 (two) times daily. 180 tablet 3   vitamin E 100 UNIT capsule Take 100 Units by mouth daily.     zinc gluconate 50 MG tablet Take 50 mg by mouth daily.     No current facility-administered medications for this visit.    PHYSICAL EXAMINATION: ECOG PERFORMANCE STATUS: 1 - Symptomatic but completely ambulatory  Vitals:   11/25/20 1018  BP: (!) 141/78  Pulse: 94  Resp: 18  Temp: (!) 97.5 F (36.4 C)  SpO2: 96%   Filed Weights   11/25/20 1018  Weight: 123 lb 8 oz (56 kg)    BREAST: No palpable masses or nodules in either right or left breasts. No palpable axillary  supraclavicular or infraclavicular adenopathy no breast tenderness or nipple discharge. (exam performed in the presence of a chaperone)  LABORATORY DATA:  I have reviewed the data as listed CMP Latest Ref Rng & Units 05/06/2020 10/11/2019 10/11/2019  Glucose 70 - 99 mg/dL 162(H) - -  BUN 8 - 23 mg/dL 17 - -  Creatinine 0.44 - 1.00 mg/dL 0.77 - -  Sodium 135 - 145 mmol/L 138 142 141  Potassium 3.5 - 5.1 mmol/L 3.7 3.6 3.7  Chloride 98 - 111 mmol/L 101 - -  CO2 22 - 32 mmol/L 28 - -  Calcium 8.9 - 10.3 mg/dL 9.0 - -  Total Protein 6.5 - 8.1 g/dL 7.2 - -  Total Bilirubin 0.3 - 1.2 mg/dL 0.3 - -  Alkaline Phos 38 - 126 U/L 50 - -  AST 15 - 41 U/L 29 - -  ALT 0 - 44 U/L 26 - -    Lab Results  Component Value Date   WBC 6.8 05/06/2020   HGB 14.1 05/06/2020   HCT 44.7 05/06/2020   MCV 94.1 05/06/2020   PLT 246 05/06/2020   NEUTROABS 5.3 05/06/2020    ASSESSMENT & PLAN:  Malignant neoplasm of upper-outer quadrant of left breast in female, estrogen receptor positive ([place]) 01/22/2019:Patient palpated a left breast mass. Mammogram showed a 0.9cm mass in the left breast at the 3 o'clock position, no left axillary adenopathy. 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  T1BN0 stage Ia   Recommendations: 1. 02/21/2019: Left lumpectomy: Grade 2 IDC 0.9 cm with clear margins, 0/2 lymph nodes negative, ER 95%, PR 95%, Ki-67 15%, HER-2 negative, T1b N0 stage Ia 2. Oncotype DX: Recurrence score 3, distant recurrence at 9 years: 3% 3. Adjuvant radiation therapy 04/02/2019-04/30/2019 4. Adjuvant antiestrogen therapy with anastrozole 1 mg daily started January 2021 5.  Genetics consult ------------------------------------------------------------------------------------------------------------------------------------------------- Anastrozole toxicities: Denies any adverse effects.  She has mild hot flashes and joint stiffness from before.  These are unchanged.   Breast  cancer surveillance: 1.  Breast exam 11/25/2020: Benign 2. Mammogram 10/26/2020: Benign breast density category C She had a bone density test to Dr. Rueben Bash office and she was instructed to continue with Fosamax with calcium and vitamin D.  Heart catheterization 10/11/2019: LV dysfunction EF 40% Return to clinic in 1 year for follow-up      No orders of the defined types were placed in this encounter.  The patient has a good understanding of the overall plan. she agrees with it. she will call with any problems that may develop before the next visit here.  Total time spent: 20 mins including face to face time and time spent for planning, charting and coordination of care  Rulon Eisenmenger, MD, MPH 11/25/2020  I, Thana Ates, am acting as scribe for Dr. Nicholas Lose.  I have reviewed the above documentation for accuracy and completeness, and I agree with the above.

## 2020-11-25 ENCOUNTER — Other Ambulatory Visit: Payer: Self-pay

## 2020-11-25 ENCOUNTER — Inpatient Hospital Stay: Payer: Medicare PPO | Attending: Hematology and Oncology | Admitting: Hematology and Oncology

## 2020-11-25 DIAGNOSIS — Z170 Estrogen receptor positive status [ER+]: Secondary | ICD-10-CM

## 2020-11-25 DIAGNOSIS — C50412 Malignant neoplasm of upper-outer quadrant of left female breast: Secondary | ICD-10-CM

## 2020-11-25 DIAGNOSIS — Z79811 Long term (current) use of aromatase inhibitors: Secondary | ICD-10-CM | POA: Diagnosis not present

## 2020-11-25 MED ORDER — ANASTROZOLE 1 MG PO TABS: 1.0000 mg | ORAL_TABLET | Freq: Every day | ORAL | 3 refills | Status: DC

## 2020-11-26 DIAGNOSIS — J449 Chronic obstructive pulmonary disease, unspecified: Secondary | ICD-10-CM | POA: Diagnosis not present

## 2020-11-26 DIAGNOSIS — R0902 Hypoxemia: Secondary | ICD-10-CM | POA: Diagnosis not present

## 2020-11-27 ENCOUNTER — Telehealth: Payer: Self-pay | Admitting: Hematology and Oncology

## 2020-11-27 NOTE — Telephone Encounter (Signed)
Scheduled per 8/10 los. Called pt and left a msg

## 2020-12-05 DIAGNOSIS — J449 Chronic obstructive pulmonary disease, unspecified: Secondary | ICD-10-CM | POA: Diagnosis not present

## 2020-12-05 DIAGNOSIS — R0902 Hypoxemia: Secondary | ICD-10-CM | POA: Diagnosis not present

## 2020-12-08 DIAGNOSIS — J301 Allergic rhinitis due to pollen: Secondary | ICD-10-CM | POA: Diagnosis not present

## 2020-12-08 DIAGNOSIS — I5022 Chronic systolic (congestive) heart failure: Secondary | ICD-10-CM | POA: Diagnosis not present

## 2020-12-08 DIAGNOSIS — I110 Hypertensive heart disease with heart failure: Secondary | ICD-10-CM | POA: Diagnosis not present

## 2020-12-08 DIAGNOSIS — J410 Simple chronic bronchitis: Secondary | ICD-10-CM | POA: Diagnosis not present

## 2020-12-18 DIAGNOSIS — I509 Heart failure, unspecified: Secondary | ICD-10-CM | POA: Diagnosis not present

## 2020-12-18 DIAGNOSIS — Z79811 Long term (current) use of aromatase inhibitors: Secondary | ICD-10-CM | POA: Diagnosis not present

## 2020-12-18 DIAGNOSIS — J301 Allergic rhinitis due to pollen: Secondary | ICD-10-CM | POA: Diagnosis not present

## 2020-12-18 DIAGNOSIS — I110 Hypertensive heart disease with heart failure: Secondary | ICD-10-CM | POA: Diagnosis not present

## 2020-12-18 DIAGNOSIS — M810 Age-related osteoporosis without current pathological fracture: Secondary | ICD-10-CM | POA: Diagnosis not present

## 2020-12-18 DIAGNOSIS — Z7983 Long term (current) use of bisphosphonates: Secondary | ICD-10-CM | POA: Diagnosis not present

## 2020-12-18 DIAGNOSIS — C50919 Malignant neoplasm of unspecified site of unspecified female breast: Secondary | ICD-10-CM | POA: Diagnosis not present

## 2020-12-18 DIAGNOSIS — K219 Gastro-esophageal reflux disease without esophagitis: Secondary | ICD-10-CM | POA: Diagnosis not present

## 2020-12-18 DIAGNOSIS — J449 Chronic obstructive pulmonary disease, unspecified: Secondary | ICD-10-CM | POA: Diagnosis not present

## 2020-12-23 DIAGNOSIS — J411 Mucopurulent chronic bronchitis: Secondary | ICD-10-CM | POA: Diagnosis not present

## 2020-12-23 DIAGNOSIS — R0902 Hypoxemia: Secondary | ICD-10-CM | POA: Diagnosis not present

## 2020-12-23 DIAGNOSIS — J449 Chronic obstructive pulmonary disease, unspecified: Secondary | ICD-10-CM | POA: Diagnosis not present

## 2020-12-23 DIAGNOSIS — J9801 Acute bronchospasm: Secondary | ICD-10-CM | POA: Diagnosis not present

## 2020-12-27 DIAGNOSIS — R0902 Hypoxemia: Secondary | ICD-10-CM | POA: Diagnosis not present

## 2020-12-27 DIAGNOSIS — J449 Chronic obstructive pulmonary disease, unspecified: Secondary | ICD-10-CM | POA: Diagnosis not present

## 2021-01-05 DIAGNOSIS — J449 Chronic obstructive pulmonary disease, unspecified: Secondary | ICD-10-CM | POA: Diagnosis not present

## 2021-01-05 DIAGNOSIS — R0902 Hypoxemia: Secondary | ICD-10-CM | POA: Diagnosis not present

## 2021-01-08 ENCOUNTER — Other Ambulatory Visit: Payer: Self-pay | Admitting: Cardiology

## 2021-01-19 DIAGNOSIS — R0902 Hypoxemia: Secondary | ICD-10-CM | POA: Diagnosis not present

## 2021-01-19 DIAGNOSIS — R0602 Shortness of breath: Secondary | ICD-10-CM | POA: Diagnosis not present

## 2021-01-22 ENCOUNTER — Encounter: Payer: Self-pay | Admitting: Cardiology

## 2021-01-22 ENCOUNTER — Ambulatory Visit: Payer: Medicare PPO | Admitting: Cardiology

## 2021-01-22 ENCOUNTER — Other Ambulatory Visit: Payer: Self-pay

## 2021-01-22 VITALS — BP 126/80 | HR 108 | Ht 64.0 in | Wt 119.0 lb

## 2021-01-22 DIAGNOSIS — I5022 Chronic systolic (congestive) heart failure: Secondary | ICD-10-CM

## 2021-01-22 DIAGNOSIS — J411 Mucopurulent chronic bronchitis: Secondary | ICD-10-CM | POA: Diagnosis not present

## 2021-01-22 DIAGNOSIS — R0902 Hypoxemia: Secondary | ICD-10-CM | POA: Diagnosis not present

## 2021-01-22 DIAGNOSIS — J9801 Acute bronchospasm: Secondary | ICD-10-CM | POA: Diagnosis not present

## 2021-01-22 DIAGNOSIS — J449 Chronic obstructive pulmonary disease, unspecified: Secondary | ICD-10-CM | POA: Diagnosis not present

## 2021-01-22 NOTE — Patient Instructions (Signed)
Medication Instructions:  Your physician recommends that you continue on your current medications as directed. Please refer to the Current Medication list given to you today.  *If you need a refill on your cardiac medications before your next appointment, please call your pharmacy*   Lab Work: None If you have labs (blood work) drawn today and your tests are completely normal, you will receive your results only by: [place] (if you have MyChart) OR A paper copy in the mail If you have any lab test that is abnormal or we need to change your treatment, we will call you to review the results.   Testing/Procedures: Your physician has requested that you have an echocardiogram. Echocardiography is a painless test that uses sound waves to create images of your heart. It provides your doctor with information about the size and shape of your heart and how well your heart's chambers and valves are working. This procedure takes approximately one hour. There are no restrictions for this procedure.    Follow-Up: At [facility], you and your health needs are our priority.  As part of our continuing mission to provide you with exceptional heart care, we have created designated Provider Care Teams.  These Care Teams include your primary Cardiologist (physician) and Advanced Practice Providers (APPs -  Physician Assistants and Nurse Practitioners) who all work together to provide you with the care you need, when you need it.  We recommend signing up for the patient portal called "MyChart".  Sign up information is provided on this After Visit Summary.  MyChart is used to connect with patients for Virtual Visits (Telemedicine).  Patients are able to view lab/test results, encounter notes, upcoming appointments, etc.  Non-urgent messages can be sent to your provider as well.   To learn more about what you can do with MyChart, go to [email or web address].    Your next appointment:   Pending Follow  UP   Other Instructions

## 2021-01-22 NOTE — Progress Notes (Signed)
Clinical Summary Patricia Nunez is a 65 y.o.female seen today for follow up of the following medical problems.    1. Chronic systolic HF   - 05/8364 echo LVEF 30-35%, Normal RV function - 09/2019 cath: normal coronaries. Mean PA 12, PCWP 1, LVEDP 1, CI 3.4  02/2020 echo LVEF 45-50%.     - some recent SOB. NO recent edema. +cough +wheezing. Can have yellowish greed sptum.  - pcp started prednisone, symptosm improving-      2. Chronic Tachycardia - can have some palpitations with high levels of activity but not at rest - reports his was an issue since her admission Jan 2021 with pneumona - CT PE at that time was negative.    - home HRs 90s to 110 chronically   3. 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 Breast cancer - s/p lumpectomy - has had radiation treatment    5. Hyperthyroid - followed by endo       Past Medical History:  Diagnosis Date   Cancer ([place])    left breast   Chronic systolic (congestive) heart failure ([place])    a. EF 30-35% by echo in 08/2019 b. cath in 09/2019 showing normal cors and EF at 40-45%   Colon polyps    Family history of breast cancer    Family history of leukemia    Hypertension    Personal history of radiation therapy    Dec 2020- Jan 2021   PONV (postoperative nausea and vomiting)    Thyroid disease    h/o hyperactive, just being followed by endocrinologist     Allergies  Allergen Reactions   Hydromet [Hydrocodone Bit-Homatrop Mbr] Nausea And Vomiting     Current Outpatient Medications  Medication Sig Dispense Refill   albuterol (VENTOLIN HFA) 108 (90 Base) MCG/ACT inhaler Inhale 1 puff into the lungs every 4 (four) hours as needed for wheezing or shortness of breath.     alendronate (FOSAMAX) 70 MG tablet Take 70 mg by mouth every Sunday.      anastrozole (ARIMIDEX) 1 MG tablet Take 1 tablet (1 mg total) by mouth daily. 90 tablet 3   aspirin 81 MG tablet Take 81 mg by mouth daily.     b complex  vitamins tablet Take 1 tablet by mouth daily.     calcium carbonate (OS-CAL) 600 MG TABS Take 600 mg by mouth daily with breakfast.     cholecalciferol (VITAMIN D3) 25 MCG (1000 UT) tablet Take 1,000 Units by mouth daily.     loratadine (CLARITIN) 10 MG tablet Take 10 mg by mouth every other day.     metoprolol succinate (TOPROL-XL) 25 MG 24 hr tablet TAKE 1 & 1/2 (ONE & ONE-HALF) TABLETS BY MOUTH ONCE DAILY 45 tablet 11   montelukast (SINGULAIR) 10 MG tablet Take 10 mg by mouth at bedtime.     Multiple Vitamin (MULTIVITAMIN) capsule Take 1 capsule by mouth daily.     OXYGEN Inhale 1.5-2 L into the lungs at bedtime.      pantoprazole (PROTONIX) 40 MG tablet Take 40 mg by mouth daily.     sacubitril-valsartan (ENTRESTO) 24-26 MG Take 1 tablet by mouth 2 (two) times daily. 180 tablet 3   vitamin E 100 UNIT capsule Take 100 Units by mouth daily.     zinc gluconate 50 MG tablet Take 50 mg by mouth daily.     No current facility-administered medications for this visit.  Past Surgical History:  Procedure Laterality Date   ABDOMINAL HYSTERECTOMY  2000   BREAST LUMPECTOMY Left 2020   BREAST LUMPECTOMY WITH RADIOACTIVE SEED AND SENTINEL LYMPH NODE BIOPSY Left 02/21/2019   Procedure: LEFT BREAST LUMPECTOMY WITH RADIOACTIVE SEED;  Surgeon: Erroll Luna, MD;  Location: [facility];  Service: General;  Laterality: Left;   CATARACT EXTRACTION W/PHACO Left 03/10/2014   Procedure: CATARACT EXTRACTION PHACO AND INTRAOCULAR LENS PLACEMENT LEFT EYE;  Surgeon: Tonny Annette Liotta, MD;  Location: [facility];  Service: Ophthalmology;  Laterality: Left;  CDE 3.50   CATARACT EXTRACTION W/PHACO Right 04/14/2014   Procedure: CATARACT EXTRACTION PHACO AND INTRAOCULAR LENS PLACEMENT (IOC);  Surgeon: Tonny Makenzie Weisner, MD;  Location: [facility];  Service: Ophthalmology;  Laterality: Right;  CDE 3.35   COLONOSCOPY  11/2008   Dr. Gala Romney: normal. Next colonoscopy in 2015 due to h/o polyps by Dr. Tamala Julian and path unknown   COLONOSCOPY N/A 12/12/2014    Procedure: COLONOSCOPY;  Surgeon: Daneil Dolin, MD;  [facility];  Service: Endoscopy;  Laterality: N/A;  1230   GANGLION CYST EXCISION Left    x2-wrist- [place]   RIGHT/LEFT HEART CATH AND CORONARY ANGIOGRAPHY N/A 10/11/2019   Procedure: RIGHT/LEFT HEART CATH AND CORONARY ANGIOGRAPHY;  Surgeon: Wellington Hampshire, MD;  Location: [facility];  Service: Cardiovascular;  Laterality: N/A;   SENTINEL NODE BIOPSY Left 02/21/2019   Procedure: Left Sentinel Lymph Node Mapping;  Surgeon: Erroll Luna, MD;  Location: [place] [facility] [place];  Service: General;  Laterality: Left;     Allergies  Allergen Reactions   Hydromet [Hydrocodone Bit-Homatrop Mbr] Nausea And Vomiting      Family History  Problem Relation Age of Onset   Breast cancer Sister        diagnosed in her 30s   Stroke Sister    Breast cancer Maternal Grandmother        diagnosed in her 12s or 21s   Breast cancer Sister        diagnosed in her 44s   Stroke Mother    Leukemia Maternal Aunt        diagnosed in her 54s   Colon cancer Neg Hx      Social History Patricia Nunez reports that she has never smoked. She has never used smokeless tobacco. Patricia Nunez reports no history of alcohol use.   Review of Systems CONSTITUTIONAL: No weight loss, fever, chills, weakness or fatigue.  HEENT: Eyes: No visual loss, blurred vision, double vision or yellow sclerae.No hearing loss, sneezing, congestion, runny nose or sore throat.  SKIN: No rash or itching.  CARDIOVASCULAR: per hpi RESPIRATORY: per hpi GASTROINTESTINAL: No anorexia, nausea, vomiting or diarrhea. No abdominal pain or blood.  GENITOURINARY: No burning on urination, no polyuria NEUROLOGICAL: No headache, dizziness, syncope, paralysis, ataxia, numbness or tingling in the extremities. No change in bowel or bladder control.  MUSCULOSKELETAL: No muscle, back pain, joint pain or stiffness.  LYMPHATICS: No enlarged nodes. No history of splenectomy.  PSYCHIATRIC: No  history of depression or anxiety.  ENDOCRINOLOGIC: No reports of sweating, cold or heat intolerance. No polyuria or polydipsia.  Marland Kitchen   Physical Examination Today's Vitals   01/22/21 1115  BP: 126/80  Pulse: (!) 108  SpO2: 97%  Weight: 119 lb (54 kg)  Height: 5\' 4"  (1.626 m)   Body mass index is 20.43 kg/m.  Gen: resting comfortably, no acute distress HEENT: no scleral icterus, pupils equal round and reactive, no palptable cervical adenopathy,  CV: RRR, no  mr/g no jvd Resp: Clear to auscultation bilaterally GI: abdomen is soft, non-tender, non-distended, normal bowel sounds, no hepatosplenomegaly MSK: extremities are warm, no edema.  Skin: warm, no rash Neuro:  no focal deficits Psych: appropriate affect   Diagnostic Studies  08/2019 echo IMPRESSIONS     1. Left ventricular ejection fraction, by estimation, is 30 to 35%. The  left ventricle has moderately decreased function. The left ventricle  demonstrates global hypokinesis with some regional variation. Left  ventricular diastolic parameters are  indeterminate. Patient appeared to be in sinus tachycardia throughout the  study.   2. Right ventricular systolic function is normal. The right ventricular  size is normal. There is normal pulmonary artery systolic pressure. The  estimated right ventricular systolic pressure is 62.8 mmHg.   3. The mitral valve is grossly normal. Trivial mitral valve  regurgitation.   4. The aortic valve is tricuspid. Aortic valve regurgitation is not  visualized. Mild aortic valve sclerosis is present, with no evidence of  aortic valve stenosis.   5. The inferior vena cava is normal in size with greater than 50%  respiratory variability, suggesting right atrial pressure of 3 mmHg.      02/2020 echo IMPRESSIONS     1. Left ventricular ejection fraction, by estimation, is 45 to 50%. The  left ventricle has mildly decreased function. The left ventricle  demonstrates global hypokinesis.  Left ventricular diastolic parameters  were normal.   2. Right ventricular systolic function is normal. The right ventricular  size is normal. There is normal pulmonary artery systolic pressure. The  estimated right ventricular systolic pressure is 36.6 mmHg.   3. The mitral valve is grossly normal. Trivial mitral valve  regurgitation.   4. The aortic valve is tricuspid. There is mild calcification of the  aortic valve. Aortic valve regurgitation is not visualized.   5. The inferior vena cava is normal in size with greater than 50%  respiratory variability, suggesting right atrial pressure of 3 mmHg.      Assessment and Plan   1. Chronic systolic HF - LVEF has nearly normalized - she is euvoelmic today - repeat echo, she was low normal to mildly decreased by last echo. Depdending on LVEF change may consider further titration of CHF medications.       Arnoldo Lenis, M.D.

## 2021-01-26 DIAGNOSIS — R0902 Hypoxemia: Secondary | ICD-10-CM | POA: Diagnosis not present

## 2021-01-26 DIAGNOSIS — J449 Chronic obstructive pulmonary disease, unspecified: Secondary | ICD-10-CM | POA: Diagnosis not present

## 2021-02-02 DIAGNOSIS — I5022 Chronic systolic (congestive) heart failure: Secondary | ICD-10-CM | POA: Diagnosis not present

## 2021-02-02 DIAGNOSIS — R0902 Hypoxemia: Secondary | ICD-10-CM | POA: Diagnosis not present

## 2021-02-02 DIAGNOSIS — R0602 Shortness of breath: Secondary | ICD-10-CM | POA: Diagnosis not present

## 2021-02-04 DIAGNOSIS — R0902 Hypoxemia: Secondary | ICD-10-CM | POA: Diagnosis not present

## 2021-02-04 DIAGNOSIS — J449 Chronic obstructive pulmonary disease, unspecified: Secondary | ICD-10-CM | POA: Diagnosis not present

## 2021-02-22 DIAGNOSIS — J9801 Acute bronchospasm: Secondary | ICD-10-CM | POA: Diagnosis not present

## 2021-02-22 DIAGNOSIS — R0902 Hypoxemia: Secondary | ICD-10-CM | POA: Diagnosis not present

## 2021-02-22 DIAGNOSIS — J449 Chronic obstructive pulmonary disease, unspecified: Secondary | ICD-10-CM | POA: Diagnosis not present

## 2021-02-22 DIAGNOSIS — J411 Mucopurulent chronic bronchitis: Secondary | ICD-10-CM | POA: Diagnosis not present

## 2021-02-25 ENCOUNTER — Other Ambulatory Visit: Payer: Self-pay

## 2021-02-25 ENCOUNTER — Ambulatory Visit (HOSPITAL_COMMUNITY)
Admission: RE | Admit: 2021-02-25 | Discharge: 2021-02-25 | Disposition: A | Discharge status: Home / Self Care | Payer: Medicare PPO | Source: Ambulatory Visit | Attending: Cardiology | Admitting: Cardiology

## 2021-02-25 DIAGNOSIS — I5022 Chronic systolic (congestive) heart failure: Secondary | ICD-10-CM | POA: Diagnosis not present

## 2021-02-25 LAB — ECHOCARDIOGRAM COMPLETE
AR max vel: 1.28 cm2
AV Area VTI: 1.26 cm2
AV Area mean vel: 1.16 cm2
AV Mean grad: 2.9 mmHg
AV Peak grad: 4.6 mmHg
Ao pk vel: 1.07 m/s
Area-P 1/2: 4.31 cm2
S' Lateral: 2.9 cm

## 2021-02-25 NOTE — Progress Notes (Signed)
*  PRELIMINARY RESULTS* Echocardiogram 2D Echocardiogram has been performed.  Samuel Germany 02/25/2021, 12:23 PM

## 2021-02-26 ENCOUNTER — Telehealth: Payer: Self-pay | Admitting: Cardiology

## 2021-02-26 DIAGNOSIS — R0902 Hypoxemia: Secondary | ICD-10-CM | POA: Diagnosis not present

## 2021-02-26 DIAGNOSIS — J449 Chronic obstructive pulmonary disease, unspecified: Secondary | ICD-10-CM | POA: Diagnosis not present

## 2021-02-26 NOTE — Telephone Encounter (Signed)
Pt returning call for results... please advise  

## 2021-02-26 NOTE — Telephone Encounter (Signed)
Pt notified by [facility] of echo results

## 2021-03-08 DIAGNOSIS — I5022 Chronic systolic (congestive) heart failure: Secondary | ICD-10-CM | POA: Diagnosis not present

## 2021-03-08 DIAGNOSIS — I110 Hypertensive heart disease with heart failure: Secondary | ICD-10-CM | POA: Diagnosis not present

## 2021-03-08 DIAGNOSIS — J069 Acute upper respiratory infection, unspecified: Secondary | ICD-10-CM | POA: Diagnosis not present

## 2021-03-22 DIAGNOSIS — J218 Acute bronchiolitis due to other specified organisms: Secondary | ICD-10-CM | POA: Diagnosis not present

## 2021-03-23 ENCOUNTER — Other Ambulatory Visit: Payer: Self-pay | Admitting: *Deleted

## 2021-03-23 MED ORDER — ENTRESTO 24-26 MG PO TABS: 1.0000 | ORAL_TABLET | Freq: Two times a day (BID) | ORAL | 3 refills | Status: DC

## 2021-04-05 DIAGNOSIS — Z0000 Encounter for general adult medical examination without abnormal findings: Secondary | ICD-10-CM | POA: Diagnosis not present

## 2021-04-05 DIAGNOSIS — J218 Acute bronchiolitis due to other specified organisms: Secondary | ICD-10-CM | POA: Diagnosis not present

## 2021-04-21 DIAGNOSIS — H40003 Preglaucoma, unspecified, bilateral: Secondary | ICD-10-CM | POA: Diagnosis not present

## 2021-04-26 ENCOUNTER — Telehealth: Payer: Self-pay | Admitting: *Deleted

## 2021-04-26 DIAGNOSIS — M25541 Pain in joints of right hand: Secondary | ICD-10-CM | POA: Diagnosis not present

## 2021-04-26 DIAGNOSIS — I10 Essential (primary) hypertension: Secondary | ICD-10-CM | POA: Diagnosis not present

## 2021-04-26 DIAGNOSIS — J301 Allergic rhinitis due to pollen: Secondary | ICD-10-CM | POA: Diagnosis not present

## 2021-04-26 NOTE — Telephone Encounter (Signed)
Received a fax stating that patient has been approved to receive Entresto at no cost until 04/17/2022.

## 2021-04-27 ENCOUNTER — Other Ambulatory Visit (HOSPITAL_COMMUNITY): Payer: Self-pay | Admitting: Family Medicine

## 2021-04-27 ENCOUNTER — Ambulatory Visit (HOSPITAL_COMMUNITY)
Admission: RE | Admit: 2021-04-27 | Discharge: 2021-04-27 | Disposition: A | Discharge status: Home / Self Care | Payer: Medicare PPO | Source: Ambulatory Visit | Attending: Family Medicine | Admitting: Family Medicine

## 2021-04-27 ENCOUNTER — Other Ambulatory Visit: Payer: Self-pay

## 2021-04-27 DIAGNOSIS — R609 Edema, unspecified: Secondary | ICD-10-CM | POA: Diagnosis not present

## 2021-04-27 DIAGNOSIS — M7989 Other specified soft tissue disorders: Secondary | ICD-10-CM | POA: Diagnosis not present

## 2021-04-28 ENCOUNTER — Other Ambulatory Visit: Payer: Self-pay | Admitting: *Deleted

## 2021-04-28 MED ORDER — ENTRESTO 24-26 MG PO TABS: 1.0000 | ORAL_TABLET | Freq: Two times a day (BID) | ORAL | 3 refills | Status: DC

## 2021-05-11 DIAGNOSIS — J449 Chronic obstructive pulmonary disease, unspecified: Secondary | ICD-10-CM | POA: Diagnosis not present

## 2021-05-11 DIAGNOSIS — R0902 Hypoxemia: Secondary | ICD-10-CM | POA: Diagnosis not present

## 2021-05-11 DIAGNOSIS — Z79811 Long term (current) use of aromatase inhibitors: Secondary | ICD-10-CM | POA: Diagnosis not present

## 2021-05-11 DIAGNOSIS — I110 Hypertensive heart disease with heart failure: Secondary | ICD-10-CM | POA: Diagnosis not present

## 2021-05-11 DIAGNOSIS — C50919 Malignant neoplasm of unspecified site of unspecified female breast: Secondary | ICD-10-CM | POA: Diagnosis not present

## 2021-05-11 DIAGNOSIS — K219 Gastro-esophageal reflux disease without esophagitis: Secondary | ICD-10-CM | POA: Diagnosis not present

## 2021-05-11 DIAGNOSIS — J301 Allergic rhinitis due to pollen: Secondary | ICD-10-CM | POA: Diagnosis not present

## 2021-05-11 DIAGNOSIS — M810 Age-related osteoporosis without current pathological fracture: Secondary | ICD-10-CM | POA: Diagnosis not present

## 2021-05-11 DIAGNOSIS — I509 Heart failure, unspecified: Secondary | ICD-10-CM | POA: Diagnosis not present

## 2021-05-25 DIAGNOSIS — J301 Allergic rhinitis due to pollen: Secondary | ICD-10-CM | POA: Diagnosis not present

## 2021-05-25 DIAGNOSIS — R0981 Nasal congestion: Secondary | ICD-10-CM | POA: Diagnosis not present

## 2021-05-25 DIAGNOSIS — R059 Cough, unspecified: Secondary | ICD-10-CM | POA: Diagnosis not present

## 2021-06-08 DIAGNOSIS — J9801 Acute bronchospasm: Secondary | ICD-10-CM | POA: Diagnosis not present

## 2021-06-08 DIAGNOSIS — J301 Allergic rhinitis due to pollen: Secondary | ICD-10-CM | POA: Diagnosis not present

## 2021-06-08 DIAGNOSIS — R0600 Dyspnea, unspecified: Secondary | ICD-10-CM | POA: Diagnosis not present

## 2021-06-08 DIAGNOSIS — R0902 Hypoxemia: Secondary | ICD-10-CM | POA: Diagnosis not present

## 2021-06-08 DIAGNOSIS — R0982 Postnasal drip: Secondary | ICD-10-CM | POA: Diagnosis not present

## 2021-06-15 ENCOUNTER — Other Ambulatory Visit (HOSPITAL_COMMUNITY): Payer: Self-pay | Admitting: Radiology

## 2021-06-15 DIAGNOSIS — R0902 Hypoxemia: Secondary | ICD-10-CM

## 2021-06-15 DIAGNOSIS — J9801 Acute bronchospasm: Secondary | ICD-10-CM

## 2021-06-22 DIAGNOSIS — J301 Allergic rhinitis due to pollen: Secondary | ICD-10-CM | POA: Diagnosis not present

## 2021-06-22 DIAGNOSIS — I5022 Chronic systolic (congestive) heart failure: Secondary | ICD-10-CM | POA: Diagnosis not present

## 2021-06-22 DIAGNOSIS — J219 Acute bronchiolitis, unspecified: Secondary | ICD-10-CM | POA: Diagnosis not present

## 2021-06-22 DIAGNOSIS — R0902 Hypoxemia: Secondary | ICD-10-CM | POA: Diagnosis not present

## 2021-06-22 DIAGNOSIS — I10 Essential (primary) hypertension: Secondary | ICD-10-CM | POA: Diagnosis not present

## 2021-06-23 DIAGNOSIS — M810 Age-related osteoporosis without current pathological fracture: Secondary | ICD-10-CM | POA: Diagnosis not present

## 2021-06-23 DIAGNOSIS — E039 Hypothyroidism, unspecified: Secondary | ICD-10-CM | POA: Diagnosis not present

## 2021-06-29 DIAGNOSIS — M810 Age-related osteoporosis without current pathological fracture: Secondary | ICD-10-CM | POA: Diagnosis not present

## 2021-06-29 DIAGNOSIS — E063 Autoimmune thyroiditis: Secondary | ICD-10-CM | POA: Diagnosis not present

## 2021-06-29 DIAGNOSIS — I10 Essential (primary) hypertension: Secondary | ICD-10-CM | POA: Diagnosis not present

## 2021-06-29 DIAGNOSIS — C50919 Malignant neoplasm of unspecified site of unspecified female breast: Secondary | ICD-10-CM | POA: Diagnosis not present

## 2021-06-29 DIAGNOSIS — E039 Hypothyroidism, unspecified: Secondary | ICD-10-CM | POA: Diagnosis not present

## 2021-07-22 ENCOUNTER — Ambulatory Visit (HOSPITAL_COMMUNITY)
Admission: RE | Admit: 2021-07-22 | Discharge: 2021-07-22 | Disposition: A | Discharge status: Home / Self Care | Payer: Medicare PPO | Source: Ambulatory Visit | Attending: Family Medicine | Admitting: Family Medicine

## 2021-07-22 DIAGNOSIS — R0902 Hypoxemia: Secondary | ICD-10-CM | POA: Insufficient documentation

## 2021-07-22 DIAGNOSIS — J9801 Acute bronchospasm: Secondary | ICD-10-CM | POA: Diagnosis not present

## 2021-07-22 LAB — PULMONARY FUNCTION TEST
DL/VA % pred: 91 %
DL/VA: 3.81 ml/min/mmHg/L
DLCO unc % pred: 65 %
DLCO unc: 12.96 ml/min/mmHg
FEF 25-75 Post: 0.8 L/sec
FEF 25-75 Pre: 0.72 L/sec
FEF2575-%Change-Post: 11 %
FEF2575-%Pred-Post: 44 %
FEF2575-%Pred-Pre: 39 %
FEV1-%Change-Post: 6 %
FEV1-%Pred-Post: 82 %
FEV1-%Pred-Pre: 77 %
FEV1-Post: 1.58 L
FEV1-Pre: 1.48 L
FEV1FVC-%Change-Post: 2 %
FEV1FVC-%Pred-Pre: 69 %
FEV6-%Change-Post: 0 %
FEV6-%Pred-Post: 114 %
FEV6-%Pred-Pre: 113 %
FEV6-Post: 2.7 L
FEV6-Pre: 2.69 L
FEV6FVC-%Change-Post: -2 %
FEV6FVC-%Pred-Post: 99 %
FEV6FVC-%Pred-Pre: 102 %
FVC-%Change-Post: 4 %
FVC-%Pred-Post: 115 %
FVC-%Pred-Pre: 111 %
FVC-Post: 2.85 L
FVC-Pre: 2.73 L
Post FEV1/FVC ratio: 55 %
Post FEV6/FVC ratio: 96 %
Pre FEV1/FVC ratio: 54 %
Pre FEV6/FVC Ratio: 98 %
RV % pred: 415 %
RV: 8.85 L
TLC % pred: 224 %
TLC: 11.35 L

## 2021-07-22 MED ORDER — ALBUTEROL SULFATE (2.5 MG/3ML) 0.083% IN NEBU
2.5000 mg | INHALATION_SOLUTION | Freq: Once | RESPIRATORY_TRACT | Status: AC
  Administered 2021-07-22: 2.5 mg via RESPIRATORY_TRACT

## 2021-07-26 DIAGNOSIS — J301 Allergic rhinitis due to pollen: Secondary | ICD-10-CM | POA: Diagnosis not present

## 2021-07-26 DIAGNOSIS — J219 Acute bronchiolitis, unspecified: Secondary | ICD-10-CM | POA: Diagnosis not present

## 2021-07-29 ENCOUNTER — Encounter (HOSPITAL_COMMUNITY): Payer: Medicare PPO

## 2021-08-16 DIAGNOSIS — J301 Allergic rhinitis due to pollen: Secondary | ICD-10-CM | POA: Diagnosis not present

## 2021-08-16 DIAGNOSIS — J219 Acute bronchiolitis, unspecified: Secondary | ICD-10-CM | POA: Diagnosis not present

## 2021-08-23 DIAGNOSIS — J301 Allergic rhinitis due to pollen: Secondary | ICD-10-CM | POA: Diagnosis not present

## 2021-08-23 DIAGNOSIS — J219 Acute bronchiolitis, unspecified: Secondary | ICD-10-CM | POA: Diagnosis not present

## 2021-08-31 ENCOUNTER — Ambulatory Visit (INDEPENDENT_AMBULATORY_CARE_PROVIDER_SITE_OTHER): Payer: Self-pay | Admitting: *Deleted

## 2021-08-31 VITALS — Ht 64.0 in | Wt 125.0 lb

## 2021-08-31 DIAGNOSIS — Z86010 Personal history of colonic polyps: Secondary | ICD-10-CM

## 2021-08-31 NOTE — Progress Notes (Signed)
Gastroenterology Pre-Procedure Review ? ?Request Date: 08/31/2021 ?Requesting Physician: Last TCS done 12/12/2014 by Dr. Gala Romney, normal colon, hx of colonic polyps (found on first TCS done years ago by Dr. Irving Shows per pt), 5 year recall ? ?PATIENT REVIEW QUESTIONS: The patient responded to the following health history questions as indicated:   ? ?1. Diabetes Melitis: no ?2. Joint replacements in the past 12 months: no ?3. Major health problems in the past 3 months: no ?4. Has an artificial valve or MVP: no ?5. Has a defibrillator: no ?6. Has been advised in past to take antibiotics in advance of a procedure like teeth cleaning: no ?7. Family history of colon cancer: no ?8. Alcohol Use: no ?9. Illicit drug Use: no ?10. History of sleep apnea: no ?11. History of coronary artery or other vascular stents placed within the last 12 months: no ?12. History of any prior anesthesia complications: yes, n/v ?13. Body mass index is 21.46 kg/m?. ?   ?MEDICATIONS & ALLERGIES:    ?Patient reports the following regarding taking any blood thinners:   ?Plavix? no ?Aspirin? Yes, 81 mg daily ?Coumadin? no ?Brilinta? no ?Xarelto? no ?Eliquis? no ?Pradaxa? no ?Savaysa? no ?Effient? no ? ?Patient confirms/reports the following medications:  ?Current Outpatient Medications  ?Medication Sig Dispense Refill  ? albuterol (VENTOLIN HFA) 108 (90 Base) MCG/ACT inhaler Inhale 1 puff into the lungs every 4 (four) hours as needed for wheezing or shortness of breath.    ? alendronate (FOSAMAX) 70 MG tablet Take 70 mg by mouth every Sunday.     ? anastrozole (ARIMIDEX) 1 MG tablet Take 1 tablet (1 mg total) by mouth daily. 90 tablet 3  ? aspirin 81 MG tablet Take 81 mg by mouth daily.    ? b complex vitamins tablet Take 1 tablet by mouth daily.    ? calcium carbonate (OS-CAL) 600 MG TABS Take 600 mg by mouth daily with breakfast.    ? cholecalciferol (VITAMIN D3) 25 MCG (1000 UT) tablet Take 1,000 Units by mouth daily.    ? metoprolol succinate  (TOPROL-XL) 25 MG 24 hr tablet TAKE 1 & 1/2 (ONE & ONE-HALF) TABLETS BY MOUTH ONCE DAILY 45 tablet 11  ? montelukast (SINGULAIR) 10 MG tablet Take 10 mg by mouth at bedtime.    ? Multiple Vitamin (MULTIVITAMIN) capsule Take 1 capsule by mouth daily.    ? OXYGEN Inhale 1.5-2 L into the lungs at bedtime.     ? pantoprazole (PROTONIX) 40 MG tablet Take 40 mg by mouth daily.    ? sacubitril-valsartan (ENTRESTO) 24-26 MG Take 1 tablet by mouth 2 (two) times daily. 180 tablet 3  ? vitamin E 100 UNIT capsule Take 100 Units by mouth daily.    ? zinc gluconate 50 MG tablet Take 50 mg by mouth daily.    ? ?No current facility-administered medications for this visit.  ? ? ?Patient confirms/reports the following allergies:  ?Allergies  ?Allergen Reactions  ? Hydromet [Hydrocodone Bit-Homatrop Mbr] Nausea And Vomiting  ? ? ?No orders of the defined types were placed in this encounter. ? ? ?AUTHORIZATION INFORMATION ?Primary Insurance: [employer],  [place] #: [id number],  Group #: [id number] ?Pre-Cert / Josem Kaufmann required:  ?Pre-Cert / Auth #:  ? ?SCHEDULE INFORMATION: ?Procedure has been scheduled as follows:  ?Date: , Time:   ?Location: [facility] with Dr. Gala Romney ? ?This Gastroenterology Pre-Precedure Review Form is being routed to the following provider(s): Neil Crouch, PA-C ?  ?

## 2021-09-06 DIAGNOSIS — J219 Acute bronchiolitis, unspecified: Secondary | ICD-10-CM | POA: Diagnosis not present

## 2021-09-06 DIAGNOSIS — J301 Allergic rhinitis due to pollen: Secondary | ICD-10-CM | POA: Diagnosis not present

## 2021-09-14 NOTE — Progress Notes (Signed)
Spoke to pt.  She was informed that we will be back in touch with her once August procedure schedules are available.  She was made aware that Dr. Roseanne Kaufman schedules are full until then.  She was happy about it because she wanted to wait until August anyway due to last procedure date was in August.

## 2021-09-14 NOTE — Progress Notes (Signed)
Ok to schedule. History of heart failure but most recent ECHO normal.  ASA 3.

## 2021-09-15 NOTE — Progress Notes (Signed)
Routed to [facility] to arrange August procedure. (per discussion with Reba)

## 2021-09-20 DIAGNOSIS — Z170 Estrogen receptor positive status [ER+]: Secondary | ICD-10-CM | POA: Diagnosis not present

## 2021-09-20 DIAGNOSIS — C50412 Malignant neoplasm of upper-outer quadrant of left female breast: Secondary | ICD-10-CM | POA: Diagnosis not present

## 2021-09-23 ENCOUNTER — Other Ambulatory Visit: Payer: Self-pay | Admitting: Adult Health

## 2021-09-23 DIAGNOSIS — Z1231 Encounter for screening mammogram for malignant neoplasm of breast: Secondary | ICD-10-CM

## 2021-10-04 DIAGNOSIS — J301 Allergic rhinitis due to pollen: Secondary | ICD-10-CM | POA: Diagnosis not present

## 2021-10-15 ENCOUNTER — Telehealth: Payer: Self-pay | Admitting: *Deleted

## 2021-10-15 NOTE — Telephone Encounter (Signed)
LMOVM to call back to schedule TCS with Dr. Rourk, ASA 3 

## 2021-10-25 ENCOUNTER — Encounter: Payer: Self-pay | Admitting: *Deleted

## 2021-10-25 NOTE — Telephone Encounter (Signed)
Letter mailed

## 2021-10-27 ENCOUNTER — Encounter: Payer: Self-pay | Admitting: *Deleted

## 2021-10-27 ENCOUNTER — Ambulatory Visit
Admission: RE | Admit: 2021-10-27 | Discharge: 2021-10-27 | Disposition: A | Discharge status: Home / Self Care | Payer: Medicare PPO | Source: Ambulatory Visit | Attending: Adult Health | Admitting: Adult Health

## 2021-10-27 DIAGNOSIS — Z1231 Encounter for screening mammogram for malignant neoplasm of breast: Secondary | ICD-10-CM

## 2021-10-27 MED ORDER — PEG 3350-KCL-NA BICARB-NACL 420 G PO SOLR
ORAL | 0 refills | Status: DC
Start: 2021-10-27 — End: 2021-11-26

## 2021-10-27 NOTE — Addendum Note (Signed)
Addended by: Cheron Every on: 10/27/2021 11:25 AM   Modules accepted: Orders

## 2021-10-27 NOTE — Telephone Encounter (Signed)
Pt called in. She has been scheduled for 8/23 at 11:30am. Aware will mail instructions/pre-op appt. Rx sent to pharmacy  [place] approved via [employer]. Auth# [id number], DOS: 12/08/2021 - 03/08/2022

## 2021-11-01 DIAGNOSIS — J9801 Acute bronchospasm: Secondary | ICD-10-CM | POA: Diagnosis not present

## 2021-11-01 DIAGNOSIS — M65312 Trigger thumb, left thumb: Secondary | ICD-10-CM | POA: Diagnosis not present

## 2021-11-01 DIAGNOSIS — J301 Allergic rhinitis due to pollen: Secondary | ICD-10-CM | POA: Diagnosis not present

## 2021-11-15 DIAGNOSIS — I5022 Chronic systolic (congestive) heart failure: Secondary | ICD-10-CM | POA: Diagnosis not present

## 2021-11-15 DIAGNOSIS — I10 Essential (primary) hypertension: Secondary | ICD-10-CM | POA: Diagnosis not present

## 2021-11-15 DIAGNOSIS — J301 Allergic rhinitis due to pollen: Secondary | ICD-10-CM | POA: Diagnosis not present

## 2021-11-15 DIAGNOSIS — E039 Hypothyroidism, unspecified: Secondary | ICD-10-CM | POA: Diagnosis not present

## 2021-11-15 DIAGNOSIS — M65312 Trigger thumb, left thumb: Secondary | ICD-10-CM | POA: Diagnosis not present

## 2021-11-15 DIAGNOSIS — C50412 Malignant neoplasm of upper-outer quadrant of left female breast: Secondary | ICD-10-CM | POA: Diagnosis not present

## 2021-11-23 ENCOUNTER — Ambulatory Visit: Payer: Medicare PPO | Admitting: Cardiology

## 2021-11-23 ENCOUNTER — Encounter: Payer: Self-pay | Admitting: Cardiology

## 2021-11-23 VITALS — BP 106/60 | HR 98 | Ht 62.0 in | Wt 119.0 lb

## 2021-11-23 DIAGNOSIS — I5022 Chronic systolic (congestive) heart failure: Secondary | ICD-10-CM | POA: Diagnosis not present

## 2021-11-23 NOTE — Patient Instructions (Signed)
Medication Instructions:  Your physician has recommended you make the following change in your medication:  Stop Aspirin   Labwork: None  Testing/Procedures: None  Follow-Up: Follow up with Dr. Harl Bowie in 1 year.   Any Other Special Instructions Will Be Listed Below (If Applicable).     If you need a refill on your cardiac medications before your next appointment, please call your pharmacy.

## 2021-11-23 NOTE — Addendum Note (Signed)
Addended by: Berlinda Last on: 11/23/2021 02:38 PM   Modules accepted: Orders

## 2021-11-23 NOTE — Progress Notes (Signed)
Clinical Summary Patricia Nunez is a 66 y.o.female seen today for follow up of the following medical problems.    1. Chronic systolic HF   - 11/9209 echo LVEF 30-35%, Normal RV function - 09/2019 cath: normal coronaries. Mean PA 12, PCWP 1, LVEDP 1, CI 3.4   02/2020 echo LVEF 45-50%.       02/2021 echo: LVEF 55-60%, no WMAs - recent issues with sinus congestion/cough, SOB. Better with prednisone. Upcoming appt with allergist - no recent edema - compliant with meds     2. Chronic Tachycardia - can have some palpitations with high levels of activity but not at rest - reports his was an issue since her admission Jan 2021 with pneumona - CT PE at that time was negative.       3. 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 Breast cancer - s/p lumpectomy - has had radiation treatment    5. Hyperthyroid - followed by endo Past Medical History:  Diagnosis Date   Cancer ([place])    left breast   Chronic systolic (congestive) heart failure ([place])    a. EF 30-35% by echo in 08/2019 b. cath in 09/2019 showing normal cors and EF at 40-45%   Colon polyps    Family history of breast cancer    Family history of leukemia    Hypertension    Personal history of radiation therapy    Dec 2020- Jan 2021   PONV (postoperative nausea and vomiting)    Thyroid disease    h/o hyperactive, just being followed by endocrinologist     Allergies  Allergen Reactions   Hydromet [Hydrocodone Bit-Homatrop Mbr] Nausea And Vomiting     Current Outpatient Medications  Medication Sig Dispense Refill   albuterol (VENTOLIN HFA) 108 (90 Base) MCG/ACT inhaler Inhale 1 puff into the lungs every 4 (four) hours as needed for wheezing or shortness of breath.     alendronate (FOSAMAX) 70 MG tablet Take 70 mg by mouth every Sunday.      anastrozole (ARIMIDEX) 1 MG tablet Take 1 tablet (1 mg total) by mouth daily. 90 tablet 3   aspirin 81 MG tablet Take 81 mg by mouth daily.     b complex  vitamins tablet Take 1 tablet by mouth daily.     calcium carbonate (OS-CAL) 600 MG TABS Take 600 mg by mouth daily with breakfast.     cholecalciferol (VITAMIN D3) 25 MCG (1000 UT) tablet Take 1,000 Units by mouth daily.     metoprolol succinate (TOPROL-XL) 25 MG 24 hr tablet TAKE 1 & 1/2 (ONE & ONE-HALF) TABLETS BY MOUTH ONCE DAILY 45 tablet 11   montelukast (SINGULAIR) 10 MG tablet Take 10 mg by mouth at bedtime.     Multiple Vitamin (MULTIVITAMIN) capsule Take 1 capsule by mouth daily.     OXYGEN Inhale 1.5-2 L into the lungs at bedtime.      pantoprazole (PROTONIX) 40 MG tablet Take 40 mg by mouth daily.     polyethylene glycol-electrolytes (NULYTELY) 420 g solution As directed 4000 mL 0   sacubitril-valsartan (ENTRESTO) 24-26 MG Take 1 tablet by mouth 2 (two) times daily. 180 tablet 3   vitamin E 100 UNIT capsule Take 100 Units by mouth daily.     zinc gluconate 50 MG tablet Take 50 mg by mouth daily.     No current facility-administered medications for this visit.     Past Surgical History:  Procedure  Laterality Date   ABDOMINAL HYSTERECTOMY  2000   BREAST LUMPECTOMY Left 2020   BREAST LUMPECTOMY WITH RADIOACTIVE SEED AND SENTINEL LYMPH NODE BIOPSY Left 02/21/2019   Procedure: LEFT BREAST LUMPECTOMY WITH RADIOACTIVE SEED;  Surgeon: Erroll Luna, MD;  Location: [facility];  Service: General;  Laterality: Left;   CATARACT EXTRACTION W/PHACO Left 03/10/2014   Procedure: CATARACT EXTRACTION PHACO AND INTRAOCULAR LENS PLACEMENT LEFT EYE;  Surgeon: Tonny Tymeir Weathington, MD;  Location: [facility];  Service: Ophthalmology;  Laterality: Left;  CDE 3.50   CATARACT EXTRACTION W/PHACO Right 04/14/2014   Procedure: CATARACT EXTRACTION PHACO AND INTRAOCULAR LENS PLACEMENT (IOC);  Surgeon: Tonny Orra Nolde, MD;  Location: [facility];  Service: Ophthalmology;  Laterality: Right;  CDE 3.35   COLONOSCOPY  11/2008   Dr. Gala Romney: normal. Next colonoscopy in 2015 due to h/o polyps by Dr. Tamala Julian and path unknown   COLONOSCOPY N/A  12/12/2014   Procedure: COLONOSCOPY;  Surgeon: Daneil Dolin, MD;  Location: [facility];  Service: Endoscopy;  Laterality: N/A;  1230   GANGLION CYST EXCISION Left    x2-wrist- [place]   RIGHT/LEFT HEART CATH AND CORONARY ANGIOGRAPHY N/A 10/11/2019   Procedure: RIGHT/LEFT HEART CATH AND CORONARY ANGIOGRAPHY;  Surgeon: Wellington Hampshire, MD;  Location: [facility];  Service: Cardiovascular;  Laterality: N/A;   SENTINEL NODE BIOPSY Left 02/21/2019   Procedure: Left Sentinel Lymph Node Mapping;  Surgeon: Erroll Luna, MD;  Location: [facility];  Service: General;  Laterality: Left;     Allergies  Allergen Reactions   Hydromet [Hydrocodone Bit-Homatrop Mbr] Nausea And Vomiting      Family History  Problem Relation Age of Onset   Breast cancer Sister        diagnosed in her 9s   Stroke Sister    Breast cancer Maternal Grandmother        diagnosed in her 12s or 19s   Breast cancer Sister        diagnosed in her 79s   Stroke Mother    Leukemia Maternal Aunt        diagnosed in her 70s   Colon cancer Neg Hx      Social History Patricia Nunez reports that she has never smoked. She has never used smokeless tobacco. Patricia Nunez reports no history of alcohol use.   Review of Systems CONSTITUTIONAL: No weight loss, fever, chills, weakness or fatigue.  HEENT: Eyes: No visual loss, blurred vision, double vision or yellow sclerae.No hearing loss, sneezing, congestion, runny nose or sore throat.  SKIN: No rash or itching.  CARDIOVASCULAR: per hpi RESPIRATORY: No shortness of breath, cough or sputum.  GASTROINTESTINAL: No anorexia, nausea, vomiting or diarrhea. No abdominal pain or blood.  GENITOURINARY: No burning on urination, no polyuria NEUROLOGICAL: No headache, dizziness, syncope, paralysis, ataxia, numbness or tingling in the extremities. No change in bowel or bladder control.  MUSCULOSKELETAL: No muscle, back pain, joint pain or stiffness.  LYMPHATICS: No enlarged nodes. No  history of splenectomy.  PSYCHIATRIC: No history of depression or anxiety.  ENDOCRINOLOGIC: No reports of sweating, cold or heat intolerance. No polyuria or polydipsia.  Marland Kitchen   Physical Examination Today's Vitals   11/23/21 1417  BP: 106/60  Pulse: 98  SpO2: 96%  Weight: 119 lb (54 kg)  Height: '5\' 2"'$  (1.575 m)   Body mass index is 21.77 kg/m.  Gen: resting comfortably, no acute distress HEENT: no scleral icterus, pupils equal round and reactive, no palptable cervical adenopathy,  CV: RRR, no  m/r/g no jvd Resp: Clear to auscultation bilaterally GI: abdomen is soft, non-tender, non-distended, normal bowel sounds, no hepatosplenomegaly MSK: extremities are warm, no edema.  Skin: warm, no rash Neuro:  no focal deficits Psych: appropriate affect   Diagnostic Studies  08/2019 echo IMPRESSIONS     1. Left ventricular ejection fraction, by estimation, is 30 to 35%. The  left ventricle has moderately decreased function. The left ventricle  demonstrates global hypokinesis with some regional variation. Left  ventricular diastolic parameters are  indeterminate. Patient appeared to be in sinus tachycardia throughout the  study.   2. Right ventricular systolic function is normal. The right ventricular  size is normal. There is normal pulmonary artery systolic pressure. The  estimated right ventricular systolic pressure is 30.0 mmHg.   3. The mitral valve is grossly normal. Trivial mitral valve  regurgitation.   4. The aortic valve is tricuspid. Aortic valve regurgitation is not  visualized. Mild aortic valve sclerosis is present, with no evidence of  aortic valve stenosis.   5. The inferior vena cava is normal in size with greater than 50%  respiratory variability, suggesting right atrial pressure of 3 mmHg.      02/2020 echo IMPRESSIONS     1. Left ventricular ejection fraction, by estimation, is 45 to 50%. The  left ventricle has mildly decreased function. The left ventricle   demonstrates global hypokinesis. Left ventricular diastolic parameters  were normal.   2. Right ventricular systolic function is normal. The right ventricular  size is normal. There is normal pulmonary artery systolic pressure. The  estimated right ventricular systolic pressure is 76.2 mmHg.   3. The mitral valve is grossly normal. Trivial mitral valve  regurgitation.   4. The aortic valve is tricuspid. There is mild calcification of the  aortic valve. Aortic valve regurgitation is not visualized.   5. The inferior vena cava is normal in size with greater than 50%  respiratory variability, suggesting right atrial pressure of 3 mmHg.    02/2021 echo IMPRESSIONS     1. Left ventricular ejection fraction, by estimation, is 55 to 60%. The  left ventricle has normal function. The left ventricle has no regional  wall motion abnormalities. Left ventricular diastolic parameters were  normal.   2. Right ventricular systolic function is normal. The right ventricular  size is normal. There is normal pulmonary artery systolic pressure.   3. The mitral valve is normal in structure. No evidence of mitral valve  regurgitation. No evidence of mitral stenosis.   4. The tricuspid valve is abnormal.   5. The aortic valve is tricuspid. There is mild calcification of the  aortic valve. There is mild thickening of the aortic valve. Aortic valve  regurgitation is not visualized. No aortic stenosis is present.   6. The inferior vena cava is normal in size with greater than 50%  respiratory variability, suggesting right atrial pressure of 3 mmHg.   Assessment and Plan   1. Chronic systolic HF/HFimpEF - LVEF has normalized, no recent symptoms.  - continue to monitor, continue current meds   F/u 1 year     Arnoldo Lenis, M.D

## 2021-11-24 NOTE — Assessment & Plan Note (Signed)
01/22/2019:Patient palpated a left breast mass. Mammogram showed a 0.9cm mass in the left breast at the 3 o'clock position, no left axillary adenopathy. 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 T1BN0 stage Ia  Recommendations: 1. 02/21/2019: Left lumpectomy: Grade 2 IDC 0.9 cm with clear margins, 0/2 lymph nodes negative, ER 95%, PR 95%, Ki-67 15%, HER-2 negative, T1bN0 stage Ia 2. Oncotype DX: Recurrence score 3, distant recurrence at 9 years: 3% 3. Adjuvant radiation therapy12/15/2020-04/30/2019 4. Adjuvant antiestrogen therapywith anastrozole 1 mg daily startedJanuary 2021 5.Genetics consult ------------------------------------------------------------------------------------------------------------------------------------------------- Anastrozoletoxicities:Denies any adverse effects. She has mild hot flashes and joint stiffness from before. These are unchanged.  Breast cancer surveillance: 1.Breast exam 11/25/2021: Benign 2.Mammogram 10/28/2020: Benign breast density category C She had a bone density test to Dr. Rueben Bash office and she was instructed to continue with Fosamax with calcium and vitamin D.  Heart catheterization 10/11/2019: LV dysfunction EF 40% Return to clinic in 1 year for follow-up

## 2021-11-25 ENCOUNTER — Inpatient Hospital Stay: Payer: Medicare PPO | Attending: Hematology and Oncology | Admitting: Hematology and Oncology

## 2021-11-25 ENCOUNTER — Other Ambulatory Visit: Payer: Self-pay

## 2021-11-25 DIAGNOSIS — Z170 Estrogen receptor positive status [ER+]: Secondary | ICD-10-CM | POA: Diagnosis not present

## 2021-11-25 DIAGNOSIS — Z79811 Long term (current) use of aromatase inhibitors: Secondary | ICD-10-CM | POA: Insufficient documentation

## 2021-11-25 DIAGNOSIS — C50412 Malignant neoplasm of upper-outer quadrant of left female breast: Secondary | ICD-10-CM | POA: Insufficient documentation

## 2021-11-25 MED ORDER — ANASTROZOLE 1 MG PO TABS: 1.0000 mg | ORAL_TABLET | Freq: Every day | ORAL | 3 refills | Status: DC

## 2021-11-25 NOTE — Progress Notes (Signed)
Patient Care Team: Iona Beard, MD as PCP - General (Family Medicine) Harl Bowie, Alphonse Guild, MD as PCP - Cardiology (Cardiology) Nicholas Lose, MD as Consulting Physician (Hematology and Oncology) Gery Pray, MD as Consulting Physician (Radiation Oncology) Erroll Luna, MD as Consulting Physician (General Surgery) Jacelyn Pi, MD as Referring Physician (Endocrinology)  DIAGNOSIS:  Encounter Diagnosis  Name Primary?   Malignant neoplasm of upper-outer quadrant of left breast in female, estrogen receptor positive ([place])     SUMMARY OF ONCOLOGIC HISTORY: Oncology History  Malignant neoplasm of upper-outer quadrant of left breast in female, estrogen receptor positive ([place])  01/22/2019 Initial Diagnosis   Patient palpated a left breast mass. Mammogram showed a 0.9cm mass in the left breast at the 3 o'clock position, no left axillary adenopathy. 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    02/07/2019 Genetic Testing   Negative genetic testing: no pathogenic variants identified on the [facility] Breast Cancer STAT panel + Common Hereditary Cancers panel. 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 The report date is 02/07/2019.  The STAT Breast cancer panel offered by [facility] includes sequencing and rearrangement analysis for the following 9 genes:  ATM, BRCA1, BRCA2, CDH1, CHEK2, PALB2, PTEN, STK11 and TP53.  The Common Hereditary Cancers Panel offered by [facility] includes sequencing and/or deletion duplication testing of the following 47 genes: APC, ATM, AXIN2, BARD1, BMPR1A, BRCA1, BRCA2, BRIP1, CDH1, CDK4, CDKN2A (p14ARF), CDKN2A (p16INK4a), CHEK2, CTNNA1, DICER1, EPCAM (Deletion/duplication testing only), GREM1 (promoter region deletion/duplication testing only), KIT, MEN1, MLH1, MSH2, MSH3, MSH6, MUTYH, NBN, NF1, NHTL1, PALB2, PDGFRA, PMS2, POLD1, POLE, PTEN, RAD50, RAD51C, RAD51D, SDHB, SDHC, SDHD, SMAD4, SMARCA4.  STK11, TP53, TSC1, TSC2, and VHL.  The following genes were evaluated for sequence changes only: SDHA and HOXB13 c.251G>A variant only.   02/21/2019 Surgery   Left lumpectomy ([place]) ([id number]): IDC, 0.9cm, grade 2, clear margins, 2 left axillary lymph nodes negative.   02/21/2019 Cancer Staging   Staging form: Breast, AJCC 8th Edition - Pathologic stage from 02/21/2019: Stage IA (pT1b, pN0, cM0, G2, ER+, PR+, HER2-)    03/04/2019 Oncotype testing   The Oncotype DX score was 3 predicting a risk of outside the breast recurrence over the next 9 years of 3% if the patient's only systemic therapy is tamoxifen for 5 years.     04/01/2019 - 04/30/2019 Radiation Therapy   The patient initially received a dose of 40.05 Gy in 15 fractions to the breast using whole-breast tangent fields. This was delivered using a 3-D conformal technique. The pt received a boost delivering an additional 10 Gy in 5 fractions using a electron boost with 34mV electrons. The total dose was 50.05 Gy.    04/2019 - 04/2024 Anti-estrogen oral therapy   Anastrozole     CHIEF COMPLIANT: Follow-up on anastrozole therapy  INTERVAL HISTORY: SJOLEIGH MINEAUis a 66year old with above-mentioned history of left breast cancer treated with lumpectomy radiation and is currently on antiestrogen therapy with anastrozole and appears to be tolerating it reasonably well.  Her only complaint is trigger finger and right thumb.  Other than that she has very mild hot flashes and very mild joint stiffness or achiness.  Denies any lumps or nodules in the breast.  Occasionally she feels spasms in the left axillary area.   ALLERGIES:  is allergic to hydromet [hydrocodone bit-homatrop mbr].  MEDICATIONS:  Current Outpatient Medications  Medication Sig Dispense Refill   albuterol (VENTOLIN  HFA) 108 (90 Base) MCG/ACT inhaler Inhale 1 puff into the lungs every 4 (four) hours as needed for wheezing or shortness of breath.     alendronate  (FOSAMAX) 70 MG tablet Take 70 mg by mouth every Sunday.      anastrozole (ARIMIDEX) 1 MG tablet Take 1 tablet (1 mg total) by mouth daily. 90 tablet 3   b complex vitamins tablet Take 1 tablet by mouth daily.     calcium carbonate (OS-CAL) 600 MG TABS Take 600 mg by mouth daily with breakfast.     cholecalciferol (VITAMIN D3) 25 MCG (1000 UT) tablet Take 1,000 Units by mouth daily.     metoprolol succinate (TOPROL-XL) 25 MG 24 hr tablet TAKE 1 & 1/2 (ONE & ONE-HALF) TABLETS BY MOUTH ONCE DAILY 45 tablet 11   montelukast (SINGULAIR) 10 MG tablet Take 10 mg by mouth at bedtime.     Multiple Vitamin (MULTIVITAMIN) capsule Take 1 capsule by mouth daily.     OXYGEN Inhale 1.5-2 L into the lungs at bedtime.      pantoprazole (PROTONIX) 40 MG tablet Take 40 mg by mouth daily.     polyethylene glycol-electrolytes (NULYTELY) 420 g solution As directed 4000 mL 0   sacubitril-valsartan (ENTRESTO) 24-26 MG Take 1 tablet by mouth 2 (two) times daily. 180 tablet 3   vitamin E 100 UNIT capsule Take 100 Units by mouth daily.     zinc gluconate 50 MG tablet Take 50 mg by mouth daily.     No current facility-administered medications for this visit.    PHYSICAL EXAMINATION: ECOG PERFORMANCE STATUS: 1 - Symptomatic but completely ambulatory  Vitals:   11/25/21 1009  BP: 128/75  Pulse: 93  Resp: 18  Temp: (!) 97.5 F (36.4 C)  SpO2: 98%   Filed Weights   11/25/21 1009  Weight: 121 lb 8 oz (55.1 kg)      LABORATORY DATA:  I have reviewed the data as listed    Latest Ref Rng & Units 05/06/2020   10:39 AM 10/11/2019    8:23 AM 10/11/2019    8:20 AM  CMP  Glucose 70 - 99 mg/dL 162     BUN 8 - 23 mg/dL 17     Creatinine 0.44 - 1.00 mg/dL 0.77     Sodium 135 - 145 mmol/L 138  142  142    141   Potassium 3.5 - 5.1 mmol/L 3.7  3.6  3.6    3.7   Chloride 98 - 111 mmol/L 101     CO2 22 - 32 mmol/L 28     Calcium 8.9 - 10.3 mg/dL 9.0     Total Protein 6.5 - 8.1 g/dL 7.2     Total Bilirubin 0.3 -  1.2 mg/dL 0.3     Alkaline Phos 38 - 126 U/L 50     AST 15 - 41 U/L 29     ALT 0 - 44 U/L 26       Lab Results  Component Value Date   WBC 6.8 05/06/2020   HGB 14.1 05/06/2020   HCT 44.7 05/06/2020   MCV 94.1 05/06/2020   PLT 246 05/06/2020   NEUTROABS 5.3 05/06/2020    ASSESSMENT & PLAN:  Malignant neoplasm of upper-outer quadrant of left breast in female, estrogen receptor positive ([place]) 01/22/2019:Patient palpated a left breast mass. Mammogram showed a 0.9cm mass in the left breast at the 3 o'clock position, no left axillary adenopathy. Biopsy showed IDC, grade 2, HER-2 -  by FISH, ER+ 95%, PR+ 95%, Ki67 15%.  T1BN0 stage Ia   Recommendations: 1. 02/21/2019: Left lumpectomy: Grade 2 IDC 0.9 cm with clear margins, 0/2 lymph nodes negative, ER 95%, PR 95%, Ki-67 15%, HER-2 negative, T1b N0 stage Ia 2. Oncotype DX: Recurrence score 3, distant recurrence at 9 years: 3% 3. Adjuvant radiation therapy 04/02/2019-04/30/2019 4. Adjuvant antiestrogen therapy with anastrozole 1 mg daily started January 2021 5.  Genetics consult ------------------------------------------------------------------------------------------------------------------------------------------------- Anastrozole toxicities: Denies any adverse effects.  She has mild hot flashes and joint stiffness from before.  These are unchanged.   Breast cancer surveillance: 1.  Breast exam 11/25/2021: Benign 2. Mammogram 10/28/2020: Benign breast density category C  Currently on Fosamax with calcium and vitamin D   Heart catheterization 10/11/2019: LV dysfunction EF 40% follows with cardiology and apparently its gotten significantly better. Return to clinic in 1 year for follow-up     No orders of the defined types were placed in this encounter.  The patient has a good understanding of the overall plan. she agrees with it. she will call with any problems that may develop before the next visit here. Total time spent: 30 mins  including face to face time and time spent for planning, charting and co-ordination of care   Harriette Ohara, MD 11/25/21

## 2021-11-26 ENCOUNTER — Encounter: Payer: Self-pay | Admitting: Allergy & Immunology

## 2021-11-26 ENCOUNTER — Ambulatory Visit: Payer: Medicare PPO | Admitting: Allergy & Immunology

## 2021-11-26 VITALS — BP 110/68 | HR 100 | Temp 98.5°F | Resp 16 | Ht 62.0 in | Wt 120.0 lb

## 2021-11-26 DIAGNOSIS — J3089 Other allergic rhinitis: Secondary | ICD-10-CM | POA: Diagnosis not present

## 2021-11-26 DIAGNOSIS — J4540 Moderate persistent asthma, uncomplicated: Secondary | ICD-10-CM | POA: Diagnosis not present

## 2021-11-26 DIAGNOSIS — J302 Other seasonal allergic rhinitis: Secondary | ICD-10-CM

## 2021-11-26 MED ORDER — LEVOCETIRIZINE DIHYDROCHLORIDE 5 MG PO TABS: 5.0000 mg | ORAL_TABLET | Freq: Every evening | ORAL | 5 refills | Status: DC

## 2021-11-26 MED ORDER — MONTELUKAST SODIUM 10 MG PO TABS: 10.0000 mg | ORAL_TABLET | Freq: Every day | ORAL | 5 refills | Status: AC

## 2021-11-26 MED ORDER — BUDESONIDE-FORMOTEROL FUMARATE 80-4.5 MCG/ACT IN AERO: 1.0000 | INHALATION_SPRAY | Freq: Two times a day (BID) | RESPIRATORY_TRACT | 5 refills | Status: DC

## 2021-11-26 NOTE — Progress Notes (Signed)
NEW PATIENT  Date of Service/Encounter:  11/26/21  Consult requested by: Iona Beard, MD   Assessment:   Moderate persistent asthma without complication   Seasonal and perennial allergic rhinitis (grasses, weeds, trees, indoor molds, outdoor molds, dust mites, cat, dog, and cockroach)  Plan/Recommendations:   1. Seasonal and perennial allergic rhinitis - Testing today showed: grasses, weeds, trees, indoor molds, outdoor molds, dust mites, cat, dog, and cockroach. - Copy of test results provided.  - Avoidance measures provided. - Stop taking: your current medications - Start taking: Xyzal (levocetirizine) '5mg'$  tablet once daily and Singulair (montelukast) '10mg'$  daily - Singulair can cause irritability and bad dreams, rarely, so STOP Singulair if this happens and call us!  - You can use an extra dose of the antihistamine, if needed, for breakthrough symptoms.  - Consider nasal saline rinses 1-2 times daily to remove allergens from the nasal cavities as well as help with mucous clearance (this is especially helpful to do before the nasal sprays are given) - Consider allergy shots as a means of long-term control (these are a CURATIVE treatment).  - Allergy shots "re-train" and "reset" the immune system to ignore environmental allergens and decrease the resulting immune response to those allergens (sneezing, itchy watery eyes, runny nose, nasal congestion, etc).    - Allergy shots improve symptoms in 75-85% of patients.  - We can discuss more at the next appointment if the medications are not working for you.  2. Chronic cough - Lung testing actually looked fairly normal, but it got even better with the albuterol treatment. - We are going to start you on a daily controller medication to help keep your symptoms under better control. - We are starting Symbicort which contains a long acting albuterol combined with an inhaled steroid. - Spacer sample and demonstration provided. - Daily  controller medication(s): Symbicort 80/4.36mg ONE PUFF twice daily with spacer - Prior to physical activity: albuterol 2 puffs 10-15 minutes before physical activity. - Rescue medications: albuterol 4 puffs every 4-6 hours as needed - Changes during respiratory infections or worsening symptoms: Increase Symbicort to TWO PUFFS twice daily for ONE TO TWO WEEKS. - Asthma control goals:  * Full participation in all desired activities (may need albuterol before activity) * Albuterol use two time or less a week on average (not counting use with activity) * Cough interfering with sleep two time or less a month * Oral steroids no more than once a year * No hospitalizations  3. Return in about 4 weeks (around 12/24/2021).    This note in its entirety was forwarded to the Provider who requested this consultation.  Subjective:   Patricia OBIis a 66y.o. female presenting today for evaluation of  Chief Complaint  Patient presents with   Allergic Rhinitis     Congestion, sneezing, headaches, cough w phlegm, itchy watery eyes, around ears is itchy. Has some SOB sometimes. When given prednisone it goes away and when she stops it it'll come back.     SDarrel Hooverhas a history of the following: Patient Active Problem List   Diagnosis Date Noted   Chronic systolic heart failure ([facility]    PNA (pneumonia) 05/13/2019   Acute respiratory failure with hypoxia ([facility] 05/13/2019   Genetic testing 02/07/2019   Family history of breast cancer    Family history of leukemia    Malignant neoplasm of upper-outer quadrant of left breast in female, estrogen receptor positive ([facility] 01/22/2019   History of colonic  polyps 11/25/2014    History obtained from: chart review and patient.  Patricia Nunez was referred by Iona Beard, MD.     Iona Beard, MD as PCP - General (Family Medicine) Harl Bowie Alphonse Guild, MD as PCP - Cardiology (Cardiology) Nicholas Lose, MD as Consulting Physician (Hematology and  Oncology) Gery Pray, MD as Consulting Physician (Radiation Oncology) Erroll Luna, MD as Consulting Physician (General Surgery) Jacelyn Pi, MD as Referring Physician (Endocrinology)  Patricia Nunez is a 66 y.o. female presenting for an evaluation of breathing problems .   Asthma/Respiratory Symptom History: She reports that she cannot get rid of congestion. She has had issues for a long time. Her PCP has been putting her on prednisone which seems to help a lot, but when she stops it returns quickly. She is thinking that it started during [place]. She did have breast cancer with radiation treatment in 2020 into 2021. Then she had COVID pneumonia in 2021.  She was treated with Augmentin as well. Review of the notes show that she did not have a positive COVID swab. Regardless, she has not felt the same since that time. Prior to the radiation, she had no problems. She estimates that she as been on prednisone around 3-4 times since 2021. She is only on albuterol and she has not used it in 4-5 months. She has oxygen at home. She had a nighttime sleep study and she was noted to be hypoxic. She has not used her oxygen in 8-10 months. She was never on any daily inhaler medication.   She does have a history of CHF. She is followed by Dr. Harl Bowie. She had a study that demonstrated no blockages. She was reporting   Allergic Rhinitis Symptom History: She was started on montelukast due to congestion. This has been going on for one year.  She did see Dr. Lucia Gaskins in February 2022. She was diagnosed with a septal deviation. Kelliann was not interested in surgery.  She has been on Claritin, Allegra, and Zyrtec without much relief.  She does Flonase on board as well.  She sees Endocrinology for thyroid issues. She has seen Dr. Mindi Curling for her endocrinology needs. She goes there once per year. She is scheduled for a bone density. She did not stay on her thyroid medications because she was having adverse reactions to this.    Otherwise, there is no history of other atopic diseases, including food allergies, drug allergies, stinging insect allergies, eczema, urticaria, or contact dermatitis. There is no significant infectious history. Vaccinations are up to date.    Past Medical History: Patient Active Problem List   Diagnosis Date Noted   Chronic systolic heart failure (HCC)    PNA (pneumonia) 05/13/2019   Acute respiratory failure with hypoxia ([place]) 05/13/2019   Genetic testing 02/07/2019   Family history of breast cancer    Family history of leukemia    Malignant neoplasm of upper-outer quadrant of left breast in female, estrogen receptor positive ([place]) 01/22/2019   History of colonic polyps 11/25/2014    Medication List:  Allergies as of 11/26/2021       Reactions   Hydromet [hydrocodone Bit-homatrop Mbr] Nausea And Vomiting        Medication List        Accurate as of November 26, 2021  1:10 PM. If you have any questions, ask your nurse or doctor.          STOP taking these medications    OXYGEN Stopped by: Valentina Shaggy, MD  polyethylene glycol-electrolytes 420 g solution Commonly known as: NuLYTELY Stopped by: Valentina Shaggy, MD       TAKE these medications    albuterol 108 (90 Base) MCG/ACT inhaler Commonly known as: VENTOLIN HFA Inhale 1 puff into the lungs every 4 (four) hours as needed for wheezing or shortness of breath.   alendronate 70 MG tablet Commonly known as: FOSAMAX Take 70 mg by mouth every Sunday.   anastrozole 1 MG tablet Commonly known as: ARIMIDEX Take 1 tablet (1 mg total) by mouth daily.   b complex vitamins tablet Take 1 tablet by mouth daily.   budesonide-formoterol 80-4.5 MCG/ACT inhaler Commonly known as: Symbicort Inhale 1 puff into the lungs in the morning and at bedtime. Started by: Valentina Shaggy, MD   calcium carbonate 600 MG Tabs tablet Commonly known as: OS-CAL Take 600 mg by mouth daily with breakfast.    cholecalciferol 25 MCG (1000 UNIT) tablet Commonly known as: VITAMIN D3 Take 1,000 Units by mouth daily.   Entresto 24-26 MG Generic drug: sacubitril-valsartan Take 1 tablet by mouth 2 (two) times daily.   levocetirizine 5 MG tablet Commonly known as: XYZAL Take 1 tablet (5 mg total) by mouth every evening. Started by: Valentina Shaggy, MD   melatonin 3 MG Tabs tablet 1 tablet at bedtime as needed   metoprolol succinate 25 MG 24 hr tablet Commonly known as: TOPROL-XL TAKE 1 & 1/2 (ONE & ONE-HALF) TABLETS BY MOUTH ONCE DAILY   montelukast 10 MG tablet Commonly known as: SINGULAIR Take 1 tablet (10 mg total) by mouth at bedtime.   multivitamin capsule Take 1 capsule by mouth daily.   pantoprazole 40 MG tablet Commonly known as: PROTONIX Take 40 mg by mouth daily.   Vitamin C 500 MG Caps See admin instructions.   vitamin E 45 MG (100 UNITS) capsule Take 100 Units by mouth daily.   zinc gluconate 50 MG tablet Take 50 mg by mouth daily.        Birth History: non-contributory  Developmental History: non-contributory  Past Surgical History: Past Surgical History:  Procedure Laterality Date   ABDOMINAL HYSTERECTOMY  2000   BREAST LUMPECTOMY Left 2020   BREAST LUMPECTOMY WITH RADIOACTIVE SEED AND SENTINEL LYMPH NODE BIOPSY Left 02/21/2019   Procedure: LEFT BREAST LUMPECTOMY WITH RADIOACTIVE SEED;  Surgeon: Erroll Luna, MD;  Location: [facility];  Service: General;  Laterality: Left;   CATARACT EXTRACTION W/PHACO Left 03/10/2014   Procedure: CATARACT EXTRACTION PHACO AND INTRAOCULAR LENS PLACEMENT LEFT EYE;  Surgeon: Tonny Branch, MD;  Location: [facility];  Service: Ophthalmology;  Laterality: Left;  CDE 3.50   CATARACT EXTRACTION W/PHACO Right 04/14/2014   Procedure: CATARACT EXTRACTION PHACO AND INTRAOCULAR LENS PLACEMENT ([facility]);  Surgeon: Tonny Branch, MD;  Location: [facility];  Service: Ophthalmology;  Laterality: Right;  CDE 3.35   COLONOSCOPY  11/2008   Dr. Gala Romney:  normal. Next colonoscopy in 2015 due to h/o polyps by Dr. Tamala Julian and path unknown   COLONOSCOPY N/A 12/12/2014   Procedure: COLONOSCOPY;  Surgeon: Daneil Dolin, MD;  [facility];  Service: Endoscopy;  Laterality: N/A;  1230   GANGLION CYST EXCISION Left    x2-wrist- [place]   RIGHT/LEFT HEART CATH AND CORONARY ANGIOGRAPHY N/A 10/11/2019   Procedure: RIGHT/LEFT HEART CATH AND CORONARY ANGIOGRAPHY;  Surgeon: Wellington Hampshire, MD;  Location: [facility];  Service: Cardiovascular;  Laterality: N/A;   SENTINEL NODE BIOPSY Left 02/21/2019   Procedure: Left Sentinel Lymph Node  Mapping;  Surgeon: Erroll Luna, MD;  Location: [facility];  Service: General;  Laterality: Left;     Family History: Family History  Problem Relation Age of Onset   Breast cancer Sister        diagnosed in her 74s   Stroke Sister    Breast cancer Maternal Grandmother        diagnosed in her 84s or 31s   Breast cancer Sister        diagnosed in her 27s   Stroke Mother    Leukemia Maternal Aunt        diagnosed in her 69s   Colon cancer Neg Hx      Social History: Abbigaile lives at home by herself.  She lives in a house that is 68 years old.  There is wood throughout the home.  She has electric heating and window units and fans for cooling.  There are no animals inside or outside of the home.  She does have dust mite covers on her bedding.  There is no tobacco exposure.  She is retired.  She denies exposure to fumes, chemicals, or dust. 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 She has been fully retired for 5 years.  She has 1 son who lives in [place].   Review of Systems  Constitutional: Negative.  Negative for chills, fever, malaise/fatigue and weight loss.  HENT:  Positive for congestion and sinus pain. Negative for ear discharge and ear pain.   Eyes:  Negative for pain, discharge and redness.  Respiratory:   Positive for cough and shortness of breath. Negative for sputum production and wheezing.   Cardiovascular: Negative.  Negative for chest pain and palpitations.  Gastrointestinal:  Negative for abdominal pain, constipation, diarrhea, heartburn, nausea and vomiting.  Skin: Negative.  Negative for itching and rash.  Neurological:  Negative for dizziness and headaches.  Endo/Heme/Allergies:  Positive for environmental allergies. Does not bruise/bleed easily.       Objective:   Blood pressure 110/68, pulse 100, temperature 98.5 F (36.9 C), resp. rate 16, height '5\' 2"'$  (1.575 m), weight 120 lb (54.4 kg), SpO2 95 %. Body mass index is 21.95 kg/m.     Physical Exam Constitutional:      Appearance: She is well-developed.  HENT:     Head: Normocephalic and atraumatic.     Right Ear: Tympanic membrane, ear canal and external ear normal. No drainage, swelling or tenderness. Tympanic membrane is not injected, scarred, erythematous, retracted or bulging.     Left Ear: Tympanic membrane, ear canal and external ear normal. No drainage, swelling or tenderness. Tympanic membrane is not injected, scarred, erythematous, retracted or bulging.     Nose: No nasal deformity, septal deviation, mucosal edema or rhinorrhea.     Right Turbinates: Enlarged and swollen.     Left Turbinates: Enlarged and swollen.     Right Sinus: No maxillary sinus tenderness or frontal sinus tenderness.     Left Sinus: No maxillary sinus tenderness or frontal sinus tenderness.     Comments: Clear rhinorrhea bilaterally.    Mouth/Throat:     Mouth: Mucous membranes are not pale and not dry.     Pharynx: Uvula midline.  Eyes:     General: Lids are normal. Allergic shiner present.        Right eye: No discharge.        Left eye:  No discharge.     Conjunctiva/sclera: Conjunctivae normal.     Right eye: Right conjunctiva is not injected. No chemosis.    Left eye: Left conjunctiva is not injected. No chemosis.    Pupils:  Pupils are equal, round, and reactive to light.  Cardiovascular:     Rate and Rhythm: Normal rate and regular rhythm.     Heart sounds: Normal heart sounds.  Pulmonary:     Effort: Pulmonary effort is normal. No tachypnea, accessory muscle usage or respiratory distress.     Breath sounds: Normal breath sounds. No wheezing, rhonchi or rales.     Comments: No crackles or wheezes.  Chest:     Chest wall: No tenderness.  Abdominal:     Tenderness: There is no abdominal tenderness. There is no guarding or rebound.  Lymphadenopathy:     Head:     Right side of head: No submandibular, tonsillar or occipital adenopathy.     Left side of head: No submandibular, tonsillar or occipital adenopathy.     Cervical: No cervical adenopathy.  Skin:    Coloration: Skin is not pale.     Findings: No abrasion, erythema, petechiae or rash. Rash is not papular, urticarial or vesicular.  Neurological:     Mental Status: She is alert.  Psychiatric:        Behavior: Behavior is cooperative.      Diagnostic studies:    Spirometry: results normal (FEV1: 1.81/99%, FVC: 2.79/120%, FEV1/FVC: 65%).    Spirometry consistent with mild obstructive disease. Xopenex four puffs via MDI treatment given in clinic with improvement in FEV1 and FVC, but not significant per ATS criteria.  Allergy Studies:    Airborne Adult Perc - 11/26/21 1032     Time Antigen Placed 1032    Allergen Manufacturer Greer    Location Back    Number of Test 59    Panel 1 Select    1. Control-Buffer 50% Glycerol Negative    2. Control-Histamine 1 mg/ml 3+    3. Albumin saline Negative    4. Surprise Negative    5. [place] Negative    6. Johnson Negative    7. Bourbon Blue Negative    8. Meadow Fescue Negative    9. Perennial Rye Negative    10. Sweet Vernal Negative    11. Timothy Negative    12. Cocklebur Negative    13. Burweed Marshelder Negative    14. Ragweed, short Negative    15. Ragweed, Giant Negative    16. Plantain,   English Negative    17. Lamb's Quarters Negative    18. Sheep Sorrell Negative    19. Rough Pigweed Negative    20. Marsh Elder, Rough Negative    21. Mugwort, Common Negative    22. Ash mix Negative    23. Birch mix Negative    24. Beech American Negative    25. Box, Elder Negative    26. Cedar, red Negative    27. Cottonwood, [place] Negative    28. Elm mix Negative    29. Hickory Negative    30. Maple mix Negative    31. Oak, [place] mix Negative    32. Pecan Pollen Negative    33. Pine mix Negative    34. Sycamore Eastern Negative    35. Elberta, Black Pollen Negative    36. Alternaria alternata Negative    37. Cladosporium Herbarum Negative    38. Aspergillus mix Negative    39. Penicillium  mix Negative    40. Bipolaris sorokiniana (Helminthosporium) Negative    41. Drechslera spicifera (Curvularia) Negative    42. Mucor plumbeus Negative    43. Fusarium moniliforme Negative    44. Aureobasidium pullulans (pullulara) Negative    45. Rhizopus oryzae Negative    46. Botrytis cinera Negative    47. Epicoccum nigrum Negative    48. Phoma betae Negative    49. Candida Albicans Negative    50. Trichophyton mentagrophytes Negative    51. Mite, D Farinae  5,000 AU/ml Negative    52. Mite, D Pteronyssinus  5,000 AU/ml Negative    53. Cat Hair 10,000 BAU/ml Negative    54.  Dog Epithelia Negative    55. Mixed Feathers Negative    56. Horse Epithelia Negative    57. Cockroach, German Negative    58. Mouse Negative    59. Tobacco Leaf Negative             Intradermal - 11/26/21 1107     Time Antigen Placed 1107    Allergen Manufacturer Lavella Hammock    Location Arm    Number of Test 15    Intradermal Select    Control Negative    [place] 1+    Johnson Negative    7 Grass Negative    Ragweed mix Negative    Weed mix 2+    Tree mix 2+    Mold 1 1+    Mold 2 3+    Mold 3 Negative    Mold 4 2+    Cat 1+    Dog 1+    Cockroach 2+    Mite mix 4+    Other Omitted              Allergy testing results were read and interpreted by myself, documented by clinical staff.         Salvatore Marvel, MD Allergy and [facility]

## 2021-11-26 NOTE — Patient Instructions (Addendum)
1. Seasonal and perennial allergic rhinitis - Testing today showed: grasses, weeds, trees, indoor molds, outdoor molds, dust mites, cat, dog, and cockroach. - Copy of test results provided.  - Avoidance measures provided. - Stop taking: your current medications - Start taking: Xyzal (levocetirizine) '5mg'$  tablet once daily and Singulair (montelukast) '10mg'$  daily - Singulair can cause irritability and bad dreams, rarely, so STOP Singulair if this happens and call us!  - You can use an extra dose of the antihistamine, if needed, for breakthrough symptoms.  - Consider nasal saline rinses 1-2 times daily to remove allergens from the nasal cavities as well as help with mucous clearance (this is especially helpful to do before the nasal sprays are given) - Consider allergy shots as a means of long-term control (these are a CURATIVE treatment).  - Allergy shots "re-train" and "reset" the immune system to ignore environmental allergens and decrease the resulting immune response to those allergens (sneezing, itchy watery eyes, runny nose, nasal congestion, etc).    - Allergy shots improve symptoms in 75-85% of patients.  - We can discuss more at the next appointment if the medications are not working for you.  2. Chronic cough - Lung testing actually looked fairly normal, but it got even better with the albuterol treatment. - We are going to start you on a daily controller medication to help keep your symptoms under better control. - We are starting Symbicort which contains a long acting albuterol combined with an inhaled steroid. - Spacer sample and demonstration provided. - Daily controller medication(s): Symbicort 80/4.37mg ONE PUFF twice daily with spacer - Prior to physical activity: albuterol 2 puffs 10-15 minutes before physical activity. - Rescue medications: albuterol 4 puffs every 4-6 hours as needed - Changes during respiratory infections or worsening symptoms: Increase Symbicort to TWO PUFFS  twice daily for ONE TO TWO WEEKS. - Asthma control goals:  * Full participation in all desired activities (may need albuterol before activity) * Albuterol use two time or less a week on average (not counting use with activity) * Cough interfering with sleep two time or less a month * Oral steroids no more than once a year * No hospitalizations  3. Return in about 4 weeks (around 12/24/2021).    Please inform [facility] any Emergency Department visits, hospitalizations, or changes in symptoms. Call [facility] going to the ED for breathing or allergy symptoms since we might be able to fit you in for a sick visit. Feel free to contact [facility] with any questions, problems, or concerns.  It was a pleasure to meet you today! You are such a delight today!   Websites that have reliable patient information: 1. American Academy of Asthma, Allergy, and Immunology: www.aaaai.org 2. Food Allergy Research and Education (FARE): foodallergy.org 3. Mothers of Asthmatics: http://www.asthmacommunitynetwork.org 4. American College of Allergy, Asthma, and Immunology: www.acaai.org   COVID-19 Vaccine Information can be found at: [email or web address] questions related to vaccine distribution or appointments, please email [email or web address] or call [phone]   We realize that you might be concerned about having an allergic reaction to the COVID19 vaccines. To help with that concern, WE ARE OFFERING THE COVID19 VACCINES IN OUR OFFICE! Ask the front desk for dates!     "Like" [facility] Facebook and Instagram for our latest updates!      A healthy democracy works best when ANew [employer] Life Insuranceparticipate! Make sure you are registered to vote! If you have moved or changed any of your contact information, you  will need to get this updated before voting!  In some cases, you MAY be able to register to vote online:  [email or web address]       Airborne Adult Perc - 11/26/21 1032     Time Antigen Placed [place]    Location Back    Number of Test 59    Panel 1 Select    1. Control-Buffer 50% Glycerol Negative    2. Control-Histamine 1 mg/ml 3+    3. Albumin saline Negative    4. [place] Negative    5. [place] Negative    6. Johnson Negative    7. [place] Blue Negative    8. Meadow Fescue Negative    9. Perennial Rye Negative    10. Sweet Vernal Negative    11. Timothy Negative    12. Cocklebur Negative    13. Burweed Marshelder Negative    14. Ragweed, short Negative    15. Ragweed, Giant Negative    16. Plantain,  English Negative    17. Lamb's Quarters Negative    18. Sheep Sorrell Negative    19. Rough Pigweed Negative    20. Marsh Elder, Rough Negative    21. Mugwort, Common Negative    22. Ash mix Negative    23. Birch mix Negative    24. Beech American Negative    25. Box, Elder Negative    26. Cedar, red Negative    27. Cottonwood, [place] Negative    28. Elm mix Negative    29. Hickory Negative    30. Maple mix Negative    31. Oak, [place] mix Negative    32. Pecan Pollen Negative    33. Pine mix Negative    34. Sycamore Eastern Negative    35. Lucas, Black Pollen Negative    36. Alternaria alternata Negative    37. Cladosporium Herbarum Negative    38. Aspergillus mix Negative    39. Penicillium mix Negative    40. Bipolaris sorokiniana (Helminthosporium) Negative    41. Drechslera spicifera (Curvularia) Negative    42. Mucor plumbeus Negative    43. Fusarium moniliforme Negative    44. Aureobasidium pullulans (pullulara) Negative    45. Rhizopus oryzae Negative    46. Botrytis cinera Negative    47. Epicoccum nigrum Negative    48. Phoma betae Negative    49. Candida Albicans Negative    50. Trichophyton mentagrophytes Negative    51. Mite, D Farinae  5,000 AU/ml Negative    52. Mite, D Pteronyssinus   5,000 AU/ml Negative    53. Cat Hair 10,000 BAU/ml Negative    54.  Dog Epithelia Negative    55. Mixed Feathers Negative    56. Horse Epithelia Negative    57. Cockroach, German Negative    58. Mouse Negative    59. Tobacco Leaf Negative             Intradermal - 11/26/21 1107     Time Antigen Placed 1107    Allergen Manufacturer Lavella Hammock    Location Arm    Number of Test 15    Intradermal Select    Control Negative    [place] 1+    Johnson Negative    7 Grass Negative    Ragweed mix Negative    Weed mix 2+    Tree mix 2+    Mold 1 1+    Mold 2 3+    Mold 3 Negative  Mold 4 2+    Cat 1+    Dog 1+    Cockroach 2+    Mite mix 4+    Other Omitted             Reducing Pollen Exposure  The American Academy of Allergy, Asthma and Immunology suggests the following steps to reduce your exposure to pollen during allergy seasons.    Do not hang sheets or clothing out to dry; pollen may collect on these items. Do not mow lawns or spend time around freshly cut grass; mowing stirs up pollen. Keep windows closed at night.  Keep car windows closed while driving. Minimize morning activities outdoors, a time when pollen counts are usually at their highest. Stay indoors as much as possible when pollen counts or humidity is high and on windy days when pollen tends to remain in the air longer. Use air conditioning when possible.  Many air conditioners have filters that trap the pollen spores. Use a HEPA room air filter to remove pollen form the indoor air you breathe.  Control of Mold Allergen   Mold and fungi can grow on a variety of surfaces provided certain temperature and moisture conditions exist.  Outdoor molds grow on plants, decaying vegetation and soil.  The major outdoor mold, Alternaria and Cladosporium, are found in very high numbers during hot and dry conditions.  Generally, a late Summer - Fall peak is seen for common outdoor fungal spores.  Rain will temporarily  lower outdoor mold spore count, but counts rise rapidly when the rainy period ends.  The most important indoor molds are Aspergillus and Penicillium.  Dark, humid and poorly ventilated basements are ideal sites for mold growth.  The next most common sites of mold growth are the bathroom and the kitchen.  Outdoor (Seasonal) Mold Control  Positive outdoor molds via skin testing: Alternaria and Cladosporium  Use air conditioning and keep windows closed Avoid exposure to decaying vegetation. Avoid leaf raking. Avoid grain handling. Consider wearing a face mask if working in moldy areas.   Indoor (Perennial) Mold Control   Positive indoor molds via skin testing: Aspergillus, Penicillium, Fusarium, Aureobasidium (Pullulara), and Rhizopus  Maintain humidity below 50%. Clean washable surfaces with 5% bleach solution. Remove sources e.g. contaminated carpets.    Control of Dust Mite Allergen    Dust mites play a major role in allergic asthma and rhinitis.  They occur in environments with high humidity wherever human skin is found.  Dust mites absorb humidity from the atmosphere (ie, they do not drink) and feed on organic matter (including shed human and animal skin).  Dust mites are a microscopic type of insect that you cannot see with the naked eye.  High levels of dust mites have been detected from mattresses, pillows, carpets, upholstered furniture, bed covers, clothes, soft toys and any woven material.  The principal allergen of the dust mite is found in its feces.  A gram of dust may contain 1,000 mites and 250,000 fecal particles.  Mite antigen is easily measured in the air during house cleaning activities.  Dust mites do not bite and do not cause harm to humans, other than by triggering allergies/asthma.    Ways to decrease your exposure to dust mites in your home:  Encase mattresses, box springs and pillows with a mite-impermeable barrier or cover   Wash sheets, blankets and drapes  weekly in hot water (130 F) with detergent and dry them in a dryer on the hot setting.  Have the room cleaned frequently with a vacuum cleaner and a damp dust-mop.  For carpeting or rugs, vacuuming with a vacuum cleaner equipped with a high-efficiency particulate air (HEPA) filter.  The dust mite allergic individual should not be in a room which is being cleaned and should wait 1 hour after cleaning before going into the room. Do not sleep on upholstered furniture (eg, couches).   If possible removing carpeting, upholstered furniture and drapery from the home is ideal.  Horizontal blinds should be eliminated in the rooms where the person spends the most time (bedroom, study, television room).  Washable vinyl, roller-type shades are optimal. Remove all non-washable stuffed toys from the bedroom.  Wash stuffed toys weekly like sheets and blankets above.   Reduce indoor humidity to less than 50%.  Inexpensive humidity monitors can be purchased at most hardware stores.  Do not use a humidifier as can make the problem worse and are not recommended.  Control of Dog or Cat Allergen  Avoidance is the best way to manage a dog or cat allergy. If you have a dog or cat and are allergic to dog or cats, consider removing the dog or cat from the home. If you have a dog or cat but don't want to find it a new home, or if your family wants a pet even though someone in the household is allergic, here are some strategies that may help keep symptoms at bay:  Keep the pet out of your bedroom and restrict it to only a few rooms. Be advised that keeping the dog or cat in only one room will not limit the allergens to that room. Don't pet, hug or kiss the dog or cat; if you do, wash your hands with soap and water. High-efficiency particulate air (HEPA) cleaners run continuously in a bedroom or living room can reduce allergen levels over time. Regular use of a high-efficiency vacuum cleaner or a central vacuum can reduce  allergen levels. Giving your dog or cat a bath at least once a week can reduce airborne allergen.  Control of Cockroach Allergen  Cockroach allergen has been identified as an important cause of acute attacks of asthma, especially in urban settings.  There are fifty-five species of cockroach that exist in the [place], however only three, the [place], [place] species produce allergen that can affect patients with Asthma.  Allergens can be obtained from fecal particles, egg casings and secretions from cockroaches.    Remove food sources. Reduce access to water. Seal access and entry points. Spray runways with 0.5-1% Diazinon or Chlorpyrifos Blow boric acid power under stoves and refrigerator. Place bait stations (hydramethylnon) at feeding sites.  Allergy Shots   Allergies are the result of a chain reaction that starts in the immune system. Your immune system controls how your body defends itself. For instance, if you have an allergy to pollen, your immune system identifies pollen as an invader or allergen. Your immune system overreacts by producing antibodies called Immunoglobulin E (IgE). These antibodies travel to cells that release chemicals, causing an allergic reaction.  The concept behind allergy immunotherapy, whether it is received in the form of shots or tablets, is that the immune system can be desensitized to specific allergens that trigger allergy symptoms. Although it requires time and patience, the payback can be long-term relief.  How Do Allergy Shots Work?  Allergy shots work much like a vaccine. Your body responds to injected amounts of a particular allergen given in increasing  doses, eventually developing a resistance and tolerance to it. Allergy shots can lead to decreased, minimal or no allergy symptoms.  There generally are two phases: build-up and maintenance. Build-up often ranges from three to six months and involves receiving injections with  increasing amounts of the allergens. The shots are typically given once or twice a week, though more rapid build-up schedules are sometimes used.  The maintenance phase begins when the most effective dose is reached. This dose is different for each person, depending on how allergic you are and your response to the build-up injections. Once the maintenance dose is reached, there are longer periods between injections, typically two to four weeks.  Occasionally doctors give cortisone-type shots that can temporarily reduce allergy symptoms. These types of shots are different and should not be confused with allergy immunotherapy shots.  Who Can Be Treated with Allergy Shots?  Allergy shots may be a good treatment approach for people with allergic rhinitis (hay fever), allergic asthma, conjunctivitis (eye allergy) or stinging insect allergy.   Before deciding to begin allergy shots, you should consider:   The length of allergy season and the severity of your symptoms  Whether medications and/or changes to your environment can control your symptoms  Your desire to avoid long-term medication use  Time: allergy immunotherapy requires a major time commitment  Cost: may vary depending on your insurance coverage  Allergy shots for children age 95 and older are effective and often well tolerated. They might prevent the onset of new allergen sensitivities or the progression to asthma.  Allergy shots are not started on patients who are pregnant but can be continued on patients who become pregnant while receiving them. In some patients with other medical conditions or who take certain common medications, allergy shots may be of risk. It is important to mention other medications you talk to your allergist.   When Will I Feel Better?  Some may experience decreased allergy symptoms during the build-up phase. For others, it may take as long as 12 months on the maintenance dose. If there is no improvement after  a year of maintenance, your allergist will discuss other treatment options with you.  If you aren't responding to allergy shots, it may be because there is not enough dose of the allergen in your vaccine or there are missing allergens that were not identified during your allergy testing. Other reasons could be that there are high levels of the allergen in your environment or major exposure to non-allergic triggers like tobacco smoke.  What Is the Length of Treatment?  Once the maintenance dose is reached, allergy shots are generally continued for three to five years. The decision to stop should be discussed with your allergist at that time. Some people may experience a permanent reduction of allergy symptoms. Others may relapse and a longer course of allergy shots can be considered.  What Are the Possible Reactions?  The two types of adverse reactions that can occur with allergy shots are local and systemic. Common local reactions include very mild redness and swelling at the injection site, which can happen immediately or several hours after. A systemic reaction, which is less common, affects the entire body or a particular body system. They are usually mild and typically respond quickly to medications. Signs include increased allergy symptoms such as sneezing, a stuffy nose or hives.  Rarely, a serious systemic reaction called anaphylaxis can develop. Symptoms include swelling in the throat, wheezing, a feeling of tightness in the chest, nausea  or dizziness. Most serious systemic reactions develop within 30 minutes of allergy shots. This is why it is strongly recommended you wait in your doctor's office for 30 minutes after your injections. Your allergist is trained to watch for reactions, and his or her staff is trained and equipped with the proper medications to identify and treat them.  Who Should Administer Allergy Shots?  The preferred location for receiving shots is your prescribing  allergist's office. Injections can sometimes be given at another facility where the physician and staff are trained to recognize and treat reactions, and have received instructions by your prescribing allergist.

## 2021-12-03 ENCOUNTER — Encounter (HOSPITAL_COMMUNITY)
Admission: RE | Admit: 2021-12-03 | Discharge: 2021-12-03 | Disposition: A | Discharge status: Home / Self Care | Payer: Medicare PPO | Source: Ambulatory Visit | Attending: Internal Medicine | Admitting: Internal Medicine

## 2021-12-08 ENCOUNTER — Ambulatory Visit (HOSPITAL_COMMUNITY): Payer: Medicare PPO | Admitting: Certified Registered Nurse Anesthetist

## 2021-12-08 ENCOUNTER — Ambulatory Visit (HOSPITAL_COMMUNITY)
Admission: RE | Admit: 2021-12-08 | Discharge: 2021-12-08 | Disposition: A | Discharge status: Home / Self Care | Payer: Medicare PPO | Attending: Internal Medicine | Admitting: Internal Medicine

## 2021-12-08 ENCOUNTER — Encounter (HOSPITAL_COMMUNITY)
Admission: RE | Disposition: A | Discharge status: Home / Self Care | Payer: Self-pay | Source: Home / Self Care | Attending: Internal Medicine

## 2021-12-08 ENCOUNTER — Encounter (HOSPITAL_COMMUNITY): Payer: Self-pay | Admitting: Internal Medicine

## 2021-12-08 ENCOUNTER — Ambulatory Visit (HOSPITAL_BASED_OUTPATIENT_CLINIC_OR_DEPARTMENT_OTHER): Payer: Medicare PPO | Admitting: Certified Registered Nurse Anesthetist

## 2021-12-08 ENCOUNTER — Other Ambulatory Visit: Payer: Self-pay

## 2021-12-08 DIAGNOSIS — Z86010 Personal history of colonic polyps: Secondary | ICD-10-CM

## 2021-12-08 DIAGNOSIS — I5022 Chronic systolic (congestive) heart failure: Secondary | ICD-10-CM | POA: Insufficient documentation

## 2021-12-08 DIAGNOSIS — I110 Hypertensive heart disease with heart failure: Secondary | ICD-10-CM | POA: Diagnosis not present

## 2021-12-08 DIAGNOSIS — Z1211 Encounter for screening for malignant neoplasm of colon: Secondary | ICD-10-CM | POA: Insufficient documentation

## 2021-12-08 DIAGNOSIS — Z09 Encounter for follow-up examination after completed treatment for conditions other than malignant neoplasm: Secondary | ICD-10-CM | POA: Diagnosis not present

## 2021-12-08 HISTORY — PX: COLONOSCOPY WITH PROPOFOL: SHX5780

## 2021-12-08 SURGERY — COLONOSCOPY WITH PROPOFOL
Anesthesia: General

## 2021-12-08 MED ORDER — PROPOFOL 500 MG/50ML IV EMUL
INTRAVENOUS | Status: DC | PRN
  Administered 2021-12-08: 75 ug/kg/min via INTRAVENOUS

## 2021-12-08 MED ORDER — LACTATED RINGERS IV SOLN: INTRAVENOUS | Status: DC | PRN

## 2021-12-08 MED ORDER — PROPOFOL 10 MG/ML IV BOLUS
INTRAVENOUS | Status: DC | PRN
  Administered 2021-12-08: 20 mg via INTRAVENOUS
  Administered 2021-12-08: 10 mg via INTRAVENOUS
  Administered 2021-12-08: 30 mg via INTRAVENOUS

## 2021-12-08 NOTE — H&P (Signed)
$'@LOGO's$ @   Primary Care Physician:  Iona Beard, MD Primary Gastroenterologist:  Dr. Gala Romney     66 year old lady with a distant history of colonic polyps  -  Dr. Tamala Julian; negative colonoscopy 2016.  She is here for surveillance examination.   No GI symptoms. Past Medical History:  Diagnosis Date   Cancer [facility])    left breast   Chronic systolic (congestive) heart failure ([place])    a. EF 30-35% by echo in 08/2019 b. cath in 09/2019 showing normal cors and EF at 40-45%   Colon polyps    Family history of breast cancer    Family history of leukemia    Hypertension    Personal history of radiation therapy    Dec 2020- Jan 2021   PONV (postoperative nausea and vomiting)    Thyroid disease    h/o hyperactive, just being followed by endocrinologist    Past Surgical History:  Procedure Laterality Date   ABDOMINAL HYSTERECTOMY  2000   BREAST LUMPECTOMY Left 2020   BREAST LUMPECTOMY WITH RADIOACTIVE SEED AND SENTINEL LYMPH NODE BIOPSY Left 02/21/2019   Procedure: LEFT BREAST LUMPECTOMY WITH RADIOACTIVE SEED;  Surgeon: Erroll Luna, MD;  Location: [facility];  Service: General;  Laterality: Left;   CATARACT EXTRACTION W/PHACO Left 03/10/2014   Procedure: CATARACT EXTRACTION PHACO AND INTRAOCULAR LENS PLACEMENT LEFT EYE;  Surgeon: Tonny Branch, MD;  Location: [facility];  Service: Ophthalmology;  Laterality: Left;  CDE 3.50   CATARACT EXTRACTION W/PHACO Right 04/14/2014   Procedure: CATARACT EXTRACTION PHACO AND INTRAOCULAR LENS PLACEMENT (IOC);  Surgeon: Tonny Branch, MD;  Location: [facility];  Service: Ophthalmology;  Laterality: Right;  CDE 3.35   COLONOSCOPY  11/2008   Dr. Gala Romney: normal. Next colonoscopy in 2015 due to h/o polyps by Dr. Tamala Julian and path unknown   COLONOSCOPY N/A 12/12/2014   Procedure: COLONOSCOPY;  Surgeon: Daneil Dolin, MD;  Location: [facility];  Service: Endoscopy;  Laterality: N/A;  1230   GANGLION CYST EXCISION Left    x2-wrist- [place]   RIGHT/LEFT HEART CATH AND CORONARY  ANGIOGRAPHY N/A 10/11/2019   Procedure: RIGHT/LEFT HEART CATH AND CORONARY ANGIOGRAPHY;  Surgeon: Wellington Hampshire, MD;  Location: [place] [facility];  Service: Cardiovascular;  Laterality: N/A;   SENTINEL NODE BIOPSY Left 02/21/2019   Procedure: Left Sentinel Lymph Node Mapping;  Surgeon: Erroll Luna, MD;  Location: [place];  Service: General;  Laterality: Left;    Prior to Admission medications   Medication Sig Start Date End Date Taking? Authorizing Provider  albuterol (VENTOLIN HFA) 108 (90 Base) MCG/ACT inhaler Inhale 1 puff into the lungs every 4 (four) hours as needed for wheezing or shortness of breath. 05/06/19  Yes [provider]  alendronate (FOSAMAX) 70 MG tablet Take 70 mg by mouth every Sunday.  01/12/19  Yes [provider]  anastrozole (ARIMIDEX) 1 MG tablet Take 1 tablet (1 mg total) by mouth daily. 11/25/21  Yes Nicholas Lose, MD  b complex vitamins tablet Take 1 tablet by mouth daily.   Yes [provider]  budesonide-formoterol (SYMBICORT) 80-4.5 MCG/ACT inhaler Inhale 1 puff into the lungs in the morning and at bedtime. 11/26/21 12/26/21 Yes Valentina Shaggy, MD  calcium carbonate (OS-CAL) 600 MG TABS Take 600 mg by mouth daily with breakfast.   Yes [provider]  cholecalciferol (VITAMIN D3) 25 MCG (1000 UT) tablet Take 1,000 Units by mouth daily.   Yes [provider]  levocetirizine (XYZAL) 5 MG tablet Take 1  tablet (5 mg total) by mouth every evening. 11/26/21  Yes Valentina Shaggy, MD  metoprolol succinate (TOPROL-XL) 25 MG 24 hr tablet TAKE 1 & 1/2 (ONE & ONE-HALF) TABLETS BY MOUTH ONCE DAILY 01/11/21  Yes Branch, Alphonse Guild, MD  montelukast (SINGULAIR) 10 MG tablet Take 1 tablet (10 mg total) by mouth at bedtime. 11/26/21 12/26/21 Yes Valentina Shaggy, MD  Multiple Vitamin (MULTIVITAMIN) capsule Take 1 capsule by mouth daily.   Yes [provider]  pantoprazole (PROTONIX) 40 MG tablet Take 40 mg by mouth  daily.   Yes [provider]  sacubitril-valsartan (ENTRESTO) 24-26 MG Take 1 tablet by mouth 2 (two) times daily. 04/28/21  Yes BranchAlphonse Guild, MD  vitamin E 100 UNIT capsule Take 100 Units by mouth daily.   Yes [provider]  zinc gluconate 50 MG tablet Take 50 mg by mouth daily.   Yes [provider]  Ascorbic Acid (VITAMIN C) 500 MG CAPS See admin instructions.    [provider]  melatonin 3 MG TABS tablet 1 tablet at bedtime as needed    [provider]    Allergies as of 10/27/2021 - Review Complete 08/31/2021  Allergen Reaction Noted   Hydromet [hydrocodone bit-homatrop mbr] Nausea And Vomiting 02/08/2019    Family History  Problem Relation Age of Onset   Breast cancer Sister        diagnosed in her 32s   Stroke Sister    Breast cancer Maternal Grandmother        diagnosed in her 10s or 34s   Breast cancer Sister        diagnosed in her 64s   Stroke Mother    Leukemia Maternal Aunt        diagnosed in her 54s   Colon cancer Neg Hx     Social History   Socioeconomic History   Marital status: Divorced    Spouse name: Not on file   Number of children: 1   Years of education: Not on file   Highest education level: Not on file  Occupational History   Occupation: school system  Tobacco Use   Smoking status: Never   Smokeless tobacco: Never   Tobacco comments:    Never smoked  Vaping Use   Vaping Use: Never used  Substance and Sexual Activity   Alcohol use: No    Alcohol/week: 0.0 standard drinks of alcohol   Drug use: No   Sexual activity: Yes    Birth control/protection: Surgical  Other Topics Concern   Not on file  Social History Narrative   Not on file   Social Determinants of Health   Financial Resource Strain: Not on file  Food Insecurity: Not on file  Transportation Needs: Not on file  Physical Activity: Not on file  Stress: Not on file  Social Connections: Not on file  Intimate Partner Violence:  Not on file    Review of Systems: See HPI, otherwise negative ROS  Physical Exam: BP (!) 147/74   Pulse 77   Temp (!) 97.5 F (36.4 C) (Oral)   Resp 17   Wt 54.4 kg   SpO2 100%   BMI 21.95 kg/m  General:   Alert,  Well-developed, well-nourished, pleasant and cooperative in NAD Mouth:  No deformity or lesions. Neck:  Supple; no masses or thyromegaly. No significant cervical adenopathy. Lungs:  Clear throughout to auscultation.   No wheezes, crackles, or rhonchi. No acute distress. Heart:  Regular rate and  rhythm; no murmurs, clicks, rubs,  or gallops. Abdomen: Non-distended, normal bowel sounds.  Soft and nontender without appreciable mass or hepatosplenomegaly.  Pulses:  Normal pulses noted. Extremities:  Without clubbing or edema.  Impression/Plan:    66 year old lady with a distant history of colonic polyps; negative exam [id number].  Here for surveillance colonoscopy. The risks, benefits, limitations, alternatives and imponderables have been reviewed with the patient. Questions have been answered. All parties are agreeable.    Notice: This dictation was prepared with Dragon dictation along with smaller phrase technology. Any transcriptional errors that result from this process are unintentional and may not be corrected upon review.

## 2021-12-08 NOTE — Anesthesia Preprocedure Evaluation (Signed)
Anesthesia Evaluation  Patient identified by MRN, date of birth, ID band Patient awake    Reviewed: Allergy & Precautions, H&P , NPO status , Patient's Chart, lab work & pertinent test results, reviewed documented beta blocker date and time   Airway Mallampati: II  TM Distance: >3 FB Neck ROM: full    Dental no notable dental hx.    Pulmonary neg pulmonary ROS,    Pulmonary exam normal breath sounds clear to auscultation       Cardiovascular Exercise Tolerance: Good hypertension, negative cardio ROS   Rhythm:regular Rate:Normal     Neuro/Psych negative neurological ROS  negative psych ROS   GI/Hepatic negative GI ROS, Neg liver ROS,   Endo/Other  negative endocrine ROS  Renal/GU negative Renal ROS  negative genitourinary   Musculoskeletal   Abdominal   Peds  Hematology negative hematology ROS (+)   Anesthesia Other Findings 1. Left ventricular ejection fraction, by estimation, is 55 to 60%. The  left ventricle has normal function. The left ventricle has no regional  wall motion abnormalities. Left ventricular diastolic parameters were  normal.  2. Right ventricular systolic function is normal. The right ventricular  size is normal. There is normal pulmonary artery systolic pressure.  3. The mitral valve is normal in structure. No evidence of mitral valve  regurgitation. No evidence of mitral stenosis.  4. The tricuspid valve is abnormal.  5. The aortic valve is tricuspid. There is mild calcification of the  aortic valve. There is mild thickening of the aortic valve. Aortic valve  regurgitation is not visualized. No aortic stenosis is present.  6. The inferior vena cava is normal in size with greater than 50%  respiratory variability, suggesting right atrial pressure of 3 mmHg.   Reproductive/Obstetrics negative OB ROS                             Anesthesia Physical Anesthesia  Plan  ASA: 2  Anesthesia Plan: General   Post-op Pain Management:    Induction:   PONV Risk Score and Plan: Propofol infusion  Airway Management Planned:   Additional Equipment:   Intra-op Plan:   Post-operative Plan:   Informed Consent: I have reviewed the patients History and Physical, chart, labs and discussed the procedure including the risks, benefits and alternatives for the proposed anesthesia with the patient or authorized representative who has indicated his/her understanding and acceptance.     Dental Advisory Given  Plan Discussed with: CRNA  Anesthesia Plan Comments:         Anesthesia Quick Evaluation

## 2021-12-08 NOTE — Transfer of Care (Signed)
Immediate Anesthesia Transfer of Care Note  Patient: Patricia Nunez  Procedure(s) Performed: COLONOSCOPY WITH PROPOFOL  Patient Location: PACU  Anesthesia Type:MAC  Level of Consciousness: awake, alert  and oriented  Airway & Oxygen Therapy: Patient Spontanous Breathing  Post-op Assessment: Report given to RN and Post -op Vital signs reviewed and stable  Post vital signs: Reviewed and stable  Last Vitals:  Vitals Value Taken Time  BP 114/62   Temp    Pulse 74   Resp 16   SpO2 98%     Last Pain:  Vitals:   12/08/21 1133  TempSrc:   PainSc: 0-No pain      Patients Stated Pain Goal: 8 (38/32/91 9166)  Complications: No notable events documented.

## 2021-12-08 NOTE — Discharge Instructions (Signed)
  Colonoscopy Discharge Instructions  Read the instructions outlined below and refer to this sheet in the next few weeks. These discharge instructions provide you with general information on caring for yourself after you leave the hospital. Your doctor may also give you specific instructions. While your treatment has been planned according to the most current medical practices available, unavoidable complications occasionally occur. If you have any problems or questions after discharge, call Dr. Gala Romney at [phone]. ACTIVITY You may resume your regular activity, but move at a slower pace for the next 24 hours.  Take frequent rest periods for the next 24 hours.  Walking will help get rid of the air and reduce the bloated feeling in your belly (abdomen).  No driving for 24 hours (because of the medicine (anesthesia) used during the test).   Do not sign any important legal documents or operate any machinery for 24 hours (because of the anesthesia used during the test).  NUTRITION Drink plenty of fluids.  You may resume your normal diet as instructed by your doctor.  Begin with a light meal and progress to your normal diet. Heavy or fried foods are harder to digest and may make you feel sick to your stomach (nauseated).  Avoid alcoholic beverages for 24 hours or as instructed.  MEDICATIONS You may resume your normal medications unless your doctor tells you otherwise.  WHAT YOU CAN EXPECT TODAY Some feelings of bloating in the abdomen.  Passage of more gas than usual.  Spotting of blood in your stool or on the toilet paper.  IF YOU HAD POLYPS REMOVED DURING THE COLONOSCOPY: No aspirin products for 7 days or as instructed.  No alcohol for 7 days or as instructed.  Eat a soft diet for the next 24 hours.  FINDING OUT THE RESULTS OF YOUR TEST Not all test results are available during your visit. If your test results are not back during the visit, make an appointment with your caregiver to find out the  results. Do not assume everything is normal if you have not heard from your caregiver or the medical facility. It is important for you to follow up on all of your test results.  SEEK IMMEDIATE MEDICAL ATTENTION IF: You have more than a spotting of blood in your stool.  Your belly is swollen (abdominal distention).  You are nauseated or vomiting.  You have a temperature over 101.  You have abdominal pain or discomfort that is severe or gets worse throughout the day.      Your colonoscopy was normal today!    A future colonoscopy is not recommended unless you develop new symptoms   At patient request, I called Robby Sermon, son, at [phone] findings and recommendations

## 2021-12-09 NOTE — Anesthesia Postprocedure Evaluation (Signed)
Anesthesia Post Note  Patient: Patricia Nunez  Procedure(s) Performed: COLONOSCOPY WITH PROPOFOL  Patient location during evaluation: Phase II Anesthesia Type: General Level of consciousness: awake Pain management: pain level controlled Vital Signs Assessment: post-procedure vital signs reviewed and stable Respiratory status: spontaneous breathing and respiratory function stable Cardiovascular status: blood pressure returned to baseline and stable Postop Assessment: no headache and no apparent nausea or vomiting Anesthetic complications: no Comments: Late entry   No notable events documented.   Last Vitals:  Vitals:   12/08/21 1022 12/08/21 1200  BP: (!) 147/74 122/77  Pulse: 77 86  Resp: 17 19  Temp: (!) 36.4 C 36.7 C  SpO2: 100% 98%    Last Pain:  Vitals:   12/09/21 1426  TempSrc:   PainSc: 0-No pain                 Louann Sjogren

## 2021-12-13 DIAGNOSIS — M65311 Trigger thumb, right thumb: Secondary | ICD-10-CM | POA: Diagnosis not present

## 2021-12-13 DIAGNOSIS — I10 Essential (primary) hypertension: Secondary | ICD-10-CM | POA: Diagnosis not present

## 2021-12-13 DIAGNOSIS — J4540 Moderate persistent asthma, uncomplicated: Secondary | ICD-10-CM | POA: Diagnosis not present

## 2021-12-13 DIAGNOSIS — J301 Allergic rhinitis due to pollen: Secondary | ICD-10-CM | POA: Diagnosis not present

## 2021-12-13 DIAGNOSIS — E039 Hypothyroidism, unspecified: Secondary | ICD-10-CM | POA: Diagnosis not present

## 2021-12-21 DIAGNOSIS — J301 Allergic rhinitis due to pollen: Secondary | ICD-10-CM | POA: Diagnosis not present

## 2021-12-21 DIAGNOSIS — J4540 Moderate persistent asthma, uncomplicated: Secondary | ICD-10-CM | POA: Diagnosis not present

## 2021-12-23 ENCOUNTER — Ambulatory Visit: Payer: Medicare PPO | Admitting: Allergy & Immunology

## 2021-12-23 NOTE — Op Note (Signed)
[facility] Patient Name: Patricia Nunez Procedure Date: 12/08/2021 11:25 AM MRN: [id number] Date of Birth: [birth year] Attending MD: Norvel Richards , MD CSN: [id number] Age: 66 Admit Type: Outpatient Procedure:                Colonoscopy Indications:              High risk colon cancer surveillance: Personal                            history of colonic polyps Providers:                Norvel Richards, MD, Rosina Lowenstein, RN, Hughie Closs RN, RN, Ladoris Gene Technician, Technician Referring MD:              Medicines:                Propofol per Anesthesia Complications:            No immediate complications. Estimated Blood Loss:     Estimated blood loss: none. Procedure:                After obtaining informed consent, the colonoscope                            was passed under direct vision. Throughout the                            procedure, the patient's blood pressure, pulse, and                            oxygen saturations were monitored continuously. The                            [id number]) scope was introduced through the                            anus and advanced to the the cecum, identified by                            appendiceal orifice and ileocecal valve. Scope In: 11:39:13 AM Scope Out: 11:53:08 AM Scope Withdrawal Time: 0 hours 7 minutes 2 seconds  Total Procedure Duration: 0 hours 13 minutes 55 seconds  Findings:      The perianal and digital rectal examinations were normal.      The colon (entire examined portion) appeared normal.      The retroflexed view of the distal rectum and anal verge was normal and       showed no anal or rectal abnormalities. Impression:               - The entire examined colon is normal.                           - The distal rectum and anal verge are normal on  retroflexion view.                           - No specimens collected. Moderate Sedation:       Moderate (conscious) sedation was personally administered by an       anesthesia professional. The following parameters were monitored: oxygen       saturation, heart rate, blood pressure, respiratory rate, EKG, adequacy       of pulmonary ventilation, and response to care. Recommendation:           - Patient has a contact number available for                            emergencies. The signs and symptoms of potential                            delayed complications were discussed with the                            patient. Return to normal activities tomorrow.                            Written discharge instructions were provided to the                            patient.                           - Resume previous diet.                           - Continue present medications.                           - No repeat colonoscopy due to current age (74                            years or older).                           - Return to GI clinic (date not yet determined). Procedure Code(s):        --- Professional ---                           [phone], Colonoscopy, flexible; diagnostic, including                            collection of specimen(s) by brushing or washing,                            when performed (separate procedure) Diagnosis Code(s):        --- Professional ---                           Z86.010, Personal history of colonic polyps CPT copyright 2019 [employer]. All rights reserved. The codes documented in this report are preliminary  and upon coder review may  be revised to meet current compliance requirements. [name]. [name], MD Norvel Richards, MD 12/23/2021 12:15:19 PM This report has been signed electronically. Number of Addenda: 0

## 2021-12-31 ENCOUNTER — Encounter (HOSPITAL_COMMUNITY): Payer: Self-pay | Admitting: Internal Medicine

## 2022-01-04 ENCOUNTER — Other Ambulatory Visit: Payer: Self-pay | Admitting: Cardiology

## 2022-01-04 DIAGNOSIS — J301 Allergic rhinitis due to pollen: Secondary | ICD-10-CM | POA: Diagnosis not present

## 2022-01-04 DIAGNOSIS — J4540 Moderate persistent asthma, uncomplicated: Secondary | ICD-10-CM | POA: Diagnosis not present

## 2022-02-01 DIAGNOSIS — J301 Allergic rhinitis due to pollen: Secondary | ICD-10-CM | POA: Diagnosis not present

## 2022-02-01 DIAGNOSIS — J4540 Moderate persistent asthma, uncomplicated: Secondary | ICD-10-CM | POA: Diagnosis not present

## 2022-02-28 ENCOUNTER — Other Ambulatory Visit: Payer: Self-pay | Admitting: *Deleted

## 2022-02-28 MED ORDER — ENTRESTO 24-26 MG PO TABS: 1.0000 | ORAL_TABLET | Freq: Two times a day (BID) | ORAL | 3 refills | Status: AC

## 2022-02-28 MED ORDER — ENTRESTO 24-26 MG PO TABS: 1.0000 | ORAL_TABLET | Freq: Two times a day (BID) | ORAL | 3 refills | Status: DC

## 2022-03-08 DIAGNOSIS — J410 Simple chronic bronchitis: Secondary | ICD-10-CM | POA: Diagnosis not present

## 2022-03-08 DIAGNOSIS — J301 Allergic rhinitis due to pollen: Secondary | ICD-10-CM | POA: Diagnosis not present

## 2022-03-16 ENCOUNTER — Telehealth: Payer: Self-pay | Admitting: *Deleted

## 2022-03-16 NOTE — Telephone Encounter (Signed)
Pt notified that she has been approved for the [employer] ([employer]) patient assistance program. Pt is approved through 04/18/23.

## 2022-04-05 DIAGNOSIS — J301 Allergic rhinitis due to pollen: Secondary | ICD-10-CM | POA: Diagnosis not present

## 2022-04-05 DIAGNOSIS — J9801 Acute bronchospasm: Secondary | ICD-10-CM | POA: Diagnosis not present

## 2022-04-05 DIAGNOSIS — Z0000 Encounter for general adult medical examination without abnormal findings: Secondary | ICD-10-CM | POA: Diagnosis not present

## 2022-04-05 DIAGNOSIS — I110 Hypertensive heart disease with heart failure: Secondary | ICD-10-CM | POA: Diagnosis not present

## 2022-04-05 DIAGNOSIS — R0981 Nasal congestion: Secondary | ICD-10-CM | POA: Diagnosis not present

## 2022-04-05 DIAGNOSIS — J45901 Unspecified asthma with (acute) exacerbation: Secondary | ICD-10-CM | POA: Diagnosis not present

## 2022-04-05 DIAGNOSIS — J410 Simple chronic bronchitis: Secondary | ICD-10-CM | POA: Diagnosis not present

## 2022-04-05 DIAGNOSIS — I5022 Chronic systolic (congestive) heart failure: Secondary | ICD-10-CM | POA: Diagnosis not present

## 2022-04-10 DIAGNOSIS — Z6821 Body mass index (BMI) 21.0-21.9, adult: Secondary | ICD-10-CM | POA: Diagnosis not present

## 2022-04-10 DIAGNOSIS — J019 Acute sinusitis, unspecified: Secondary | ICD-10-CM | POA: Diagnosis not present

## 2022-04-25 DIAGNOSIS — H40013 Open angle with borderline findings, low risk, bilateral: Secondary | ICD-10-CM | POA: Diagnosis not present

## 2022-04-28 DIAGNOSIS — J449 Chronic obstructive pulmonary disease, unspecified: Secondary | ICD-10-CM | POA: Diagnosis not present

## 2022-04-28 DIAGNOSIS — R0902 Hypoxemia: Secondary | ICD-10-CM | POA: Diagnosis not present

## 2022-05-07 DIAGNOSIS — R0902 Hypoxemia: Secondary | ICD-10-CM | POA: Diagnosis not present

## 2022-05-07 DIAGNOSIS — J449 Chronic obstructive pulmonary disease, unspecified: Secondary | ICD-10-CM | POA: Diagnosis not present

## 2022-05-19 DIAGNOSIS — I10 Essential (primary) hypertension: Secondary | ICD-10-CM | POA: Diagnosis not present

## 2022-05-19 DIAGNOSIS — M65311 Trigger thumb, right thumb: Secondary | ICD-10-CM | POA: Diagnosis not present

## 2022-05-19 DIAGNOSIS — J45909 Unspecified asthma, uncomplicated: Secondary | ICD-10-CM | POA: Diagnosis not present

## 2022-05-19 DIAGNOSIS — J301 Allergic rhinitis due to pollen: Secondary | ICD-10-CM | POA: Diagnosis not present

## 2022-05-29 ENCOUNTER — Other Ambulatory Visit: Payer: Self-pay | Admitting: Allergy & Immunology

## 2022-05-29 DIAGNOSIS — J449 Chronic obstructive pulmonary disease, unspecified: Secondary | ICD-10-CM | POA: Diagnosis not present

## 2022-05-29 DIAGNOSIS — R0902 Hypoxemia: Secondary | ICD-10-CM | POA: Diagnosis not present

## 2022-06-06 DIAGNOSIS — R7303 Prediabetes: Secondary | ICD-10-CM | POA: Diagnosis not present

## 2022-06-06 DIAGNOSIS — I5022 Chronic systolic (congestive) heart failure: Secondary | ICD-10-CM | POA: Diagnosis not present

## 2022-06-06 DIAGNOSIS — I10 Essential (primary) hypertension: Secondary | ICD-10-CM | POA: Diagnosis not present

## 2022-06-07 DIAGNOSIS — R0902 Hypoxemia: Secondary | ICD-10-CM | POA: Diagnosis not present

## 2022-06-07 DIAGNOSIS — J449 Chronic obstructive pulmonary disease, unspecified: Secondary | ICD-10-CM | POA: Diagnosis not present

## 2022-06-27 DIAGNOSIS — J449 Chronic obstructive pulmonary disease, unspecified: Secondary | ICD-10-CM | POA: Diagnosis not present

## 2022-06-27 DIAGNOSIS — R0902 Hypoxemia: Secondary | ICD-10-CM | POA: Diagnosis not present

## 2022-06-30 DIAGNOSIS — M810 Age-related osteoporosis without current pathological fracture: Secondary | ICD-10-CM | POA: Diagnosis not present

## 2022-06-30 DIAGNOSIS — M8589 Other specified disorders of bone density and structure, multiple sites: Secondary | ICD-10-CM | POA: Diagnosis not present

## 2022-06-30 DIAGNOSIS — E039 Hypothyroidism, unspecified: Secondary | ICD-10-CM | POA: Diagnosis not present

## 2022-07-06 DIAGNOSIS — R0902 Hypoxemia: Secondary | ICD-10-CM | POA: Diagnosis not present

## 2022-07-06 DIAGNOSIS — J449 Chronic obstructive pulmonary disease, unspecified: Secondary | ICD-10-CM | POA: Diagnosis not present

## 2022-07-07 DIAGNOSIS — E063 Autoimmune thyroiditis: Secondary | ICD-10-CM | POA: Diagnosis not present

## 2022-07-07 DIAGNOSIS — M810 Age-related osteoporosis without current pathological fracture: Secondary | ICD-10-CM | POA: Diagnosis not present

## 2022-07-07 DIAGNOSIS — I10 Essential (primary) hypertension: Secondary | ICD-10-CM | POA: Diagnosis not present

## 2022-07-07 DIAGNOSIS — C50919 Malignant neoplasm of unspecified site of unspecified female breast: Secondary | ICD-10-CM | POA: Diagnosis not present

## 2022-07-07 DIAGNOSIS — E039 Hypothyroidism, unspecified: Secondary | ICD-10-CM | POA: Diagnosis not present

## 2022-07-15 ENCOUNTER — Other Ambulatory Visit: Payer: Self-pay | Admitting: Cardiology

## 2022-07-28 DIAGNOSIS — R0902 Hypoxemia: Secondary | ICD-10-CM | POA: Diagnosis not present

## 2022-07-28 DIAGNOSIS — J449 Chronic obstructive pulmonary disease, unspecified: Secondary | ICD-10-CM | POA: Diagnosis not present

## 2022-07-29 DIAGNOSIS — Z23 Encounter for immunization: Secondary | ICD-10-CM | POA: Diagnosis not present

## 2022-07-29 DIAGNOSIS — J4540 Moderate persistent asthma, uncomplicated: Secondary | ICD-10-CM | POA: Diagnosis not present

## 2022-07-29 DIAGNOSIS — J301 Allergic rhinitis due to pollen: Secondary | ICD-10-CM | POA: Diagnosis not present

## 2022-07-29 DIAGNOSIS — J4550 Severe persistent asthma, uncomplicated: Secondary | ICD-10-CM | POA: Diagnosis not present

## 2022-08-08 DIAGNOSIS — J45909 Unspecified asthma, uncomplicated: Secondary | ICD-10-CM | POA: Diagnosis not present

## 2022-08-08 DIAGNOSIS — E039 Hypothyroidism, unspecified: Secondary | ICD-10-CM | POA: Diagnosis not present

## 2022-08-08 DIAGNOSIS — J301 Allergic rhinitis due to pollen: Secondary | ICD-10-CM | POA: Diagnosis not present

## 2022-08-08 DIAGNOSIS — M65311 Trigger thumb, right thumb: Secondary | ICD-10-CM | POA: Diagnosis not present

## 2022-08-08 DIAGNOSIS — I10 Essential (primary) hypertension: Secondary | ICD-10-CM | POA: Diagnosis not present

## 2022-08-27 DIAGNOSIS — J449 Chronic obstructive pulmonary disease, unspecified: Secondary | ICD-10-CM | POA: Diagnosis not present

## 2022-08-27 DIAGNOSIS — R0902 Hypoxemia: Secondary | ICD-10-CM | POA: Diagnosis not present

## 2022-09-26 ENCOUNTER — Other Ambulatory Visit: Payer: Self-pay | Admitting: Family Medicine

## 2022-09-26 DIAGNOSIS — Z1231 Encounter for screening mammogram for malignant neoplasm of breast: Secondary | ICD-10-CM

## 2022-09-27 DIAGNOSIS — R0902 Hypoxemia: Secondary | ICD-10-CM | POA: Diagnosis not present

## 2022-09-27 DIAGNOSIS — J449 Chronic obstructive pulmonary disease, unspecified: Secondary | ICD-10-CM | POA: Diagnosis not present

## 2022-10-04 ENCOUNTER — Other Ambulatory Visit: Payer: Self-pay | Admitting: Allergy & Immunology

## 2022-10-08 DIAGNOSIS — I429 Cardiomyopathy, unspecified: Secondary | ICD-10-CM | POA: Diagnosis not present

## 2022-10-08 DIAGNOSIS — K219 Gastro-esophageal reflux disease without esophagitis: Secondary | ICD-10-CM | POA: Diagnosis not present

## 2022-10-08 DIAGNOSIS — J301 Allergic rhinitis due to pollen: Secondary | ICD-10-CM | POA: Diagnosis not present

## 2022-10-08 DIAGNOSIS — M810 Age-related osteoporosis without current pathological fracture: Secondary | ICD-10-CM | POA: Diagnosis not present

## 2022-10-08 DIAGNOSIS — Z79899 Other long term (current) drug therapy: Secondary | ICD-10-CM | POA: Diagnosis not present

## 2022-10-08 DIAGNOSIS — I130 Hypertensive heart and chronic kidney disease with heart failure and stage 1 through stage 4 chronic kidney disease, or unspecified chronic kidney disease: Secondary | ICD-10-CM | POA: Diagnosis not present

## 2022-10-08 DIAGNOSIS — C50919 Malignant neoplasm of unspecified site of unspecified female breast: Secondary | ICD-10-CM | POA: Diagnosis not present

## 2022-10-08 DIAGNOSIS — N182 Chronic kidney disease, stage 2 (mild): Secondary | ICD-10-CM | POA: Diagnosis not present

## 2022-10-08 DIAGNOSIS — J4489 Other specified chronic obstructive pulmonary disease: Secondary | ICD-10-CM | POA: Diagnosis not present

## 2022-10-27 ENCOUNTER — Encounter: Payer: Self-pay | Admitting: Cardiology

## 2022-10-27 ENCOUNTER — Ambulatory Visit: Payer: Medicare PPO | Attending: Cardiology | Admitting: Cardiology

## 2022-10-27 VITALS — BP 124/76 | HR 80 | Ht 62.0 in | Wt 115.0 lb

## 2022-10-27 DIAGNOSIS — I5032 Chronic diastolic (congestive) heart failure: Secondary | ICD-10-CM | POA: Diagnosis not present

## 2022-10-27 DIAGNOSIS — J449 Chronic obstructive pulmonary disease, unspecified: Secondary | ICD-10-CM | POA: Diagnosis not present

## 2022-10-27 DIAGNOSIS — R0902 Hypoxemia: Secondary | ICD-10-CM | POA: Diagnosis not present

## 2022-10-27 NOTE — Progress Notes (Signed)
Clinical Summary Patricia Nunez is a 67 y.o.female seen today for follow up of the following medical problems.    1. Chronic systolic HF   - 08/2019 echo LVEF 30-35%, Normal RV function - 09/2019 cath: normal coronaries. Mean PA 12, PCWP 1, LVEDP 1, CI 3.4  02/2020 echo LVEF 45-50%.   02/2021 echo: LVEF 55-60%, no WMAs  - no recent edema. No SOB/DOE - compliant with meds      2. Chronic Tachycardia - can have some palpitations with high levels of activity but not at rest - reports his was an issue since her admission Jan 2021 with pneumona - CT PE at that time was negative.        3. Nocturnal hypoxia - has home O2, has not needed   4. Breast cancer - s/p lumpectomy - has had radiation treatment    5. Hyperthyroid - followed by endo  6. Asthma - followe [facility]th[facility] Past Medical History:  Diagnosis Date   Cancer (HCC)    left breast   Chronic systolic (congestive) heart failure (HCC)    a. EF 30-35% by echo in 08/2019 b. cath in 09/2019 showing normal cors and EF at 40-45%   Colon polyps    Family history of breast cancer    Family history of leukemia    Hypertension    Personal history of radiation therapy    Dec 2020- Jan 2021   PONV (postoperative nausea and vomiting)    Thyroid disease    h/o hyperactive, just being followed by endocrinologist     Allergies  Allergen Reactions   Hydromet [Hydrocodone Bit-Homatrop Mbr] Nausea And Vomiting     Current Outpatient Medications  Medication Sig Dispense Refill   albuterol (VENTOLIN HFA) 108 (90 Base) MCG/ACT inhaler Inhale 1 puff into the lungs every 4 (four) hours as needed for wheezing or shortness of breath.     alendronate (FOSAMAX) 70 MG tablet Take 70 mg by mouth every Sunday.      anastrozole (ARIMIDEX) 1 MG tablet Take 1 tablet (1 mg total) by mouth daily. 90 tablet 3   Ascorbic Acid (VITAMIN C) 500 MG CAPS See admin instructions.     b complex vitamins tablet Take 1 tablet by mouth  daily.     budesonide-formoterol (SYMBICORT) 80-4.5 MCG/ACT inhaler Inhale 1 puff into the lungs in the morning and at bedtime. 1 each 5   calcium carbonate (OS-CAL) 600 MG TABS Take 600 mg by mouth daily with breakfast.     cholecalciferol (VITAMIN D3) 25 MCG (1000 UT) tablet Take 1,000 Units by mouth daily.     levocetirizine (XYZAL) 5 MG tablet TAKE 1 TABLET BY MOUTH ONCE DAILY IN THE EVENING 30 tablet 5   melatonin 3 MG TABS tablet 1 tablet at bedtime as needed     metoprolol succinate (TOPROL-XL) 25 MG 24 hr tablet TAKE 1 1/2 TABLETS BY MOUTH ONCE DAILY 45 tablet 3   montelukast (SINGULAIR) 10 MG tablet Take 1 tablet (10 mg total) by mouth at bedtime. 30 tablet 5   Multiple Vitamin (MULTIVITAMIN) capsule Take 1 capsule by mouth daily.     pantoprazole (PROTONIX) 40 MG tablet Take 40 mg by mouth daily.     sacubitril-valsartan (ENTRESTO) 24-26 MG Take 1 tablet by mouth 2 (two) times daily. 180 tablet 3   vitamin E 100 UNIT capsule Take 100 Units by mouth daily.     zinc gluconate 50 MG tablet Take 50 mg  by mouth daily.     No current facility-administered medications for this visit.     Past Surgical History:  Procedure Laterality Date   ABDOMINAL HYSTERECTOMY  2000   BREAST LUMPECTOMY Left 2020   BREAST LUMPECTOMY WITH RADIOACTIVE SEED AND SENTINEL LYMPH NODE BIOPSY Left 02/21/2019   Procedure: LEFT BREAST LUMPECTOMY WITH RADIOACTIVE SEED;  Surgeon: Harriette Bouillon, MD;  Location: [facility];  Service: General;  Laterality: Left;   CATARACT EXTRACTION W/PHACO Left 03/10/2014   Procedure: CATARACT EXTRACTION PHACO AND INTRAOCULAR LENS PLACEMENT LEFT EYE;  Surgeon: Gemma Payor, MD;  Location: [facility];  Service: Ophthalmology;  Laterality: Left;  CDE 3.50   CATARACT EXTRACTION W/PHACO Right 04/14/2014   Procedure: CATARACT EXTRACTION PHACO AND INTRAOCULAR LENS PLACEMENT (IOC);  Surgeon: Gemma Payor, MD;  Location: [facility];  Service: Ophthalmology;  Laterality: Right;  CDE 3.35   COLONOSCOPY   11/2008   Dr. Jena Gauss: normal. Next colonoscopy in 2015 due to h/o polyps by Dr. Katrinka Blazing and path unknown   COLONOSCOPY N/A 12/12/2014   Procedure: COLONOSCOPY;  Surgeon: Corbin Ade, MD;  Location: [facility];  Service: Endoscopy;  Laterality: N/A;  1230   COLONOSCOPY WITH PROPOFOL N/A 12/08/2021   Procedure: COLONOSCOPY WITH PROPOFOL;  Surgeon: Corbin Ade, MD;  Location: [facility];  Service: Endoscopy;  Laterality: N/A;  11:30am   GANGLION CYST EXCISION Left    x2-wrist- Smith   RIGHT/LEFT HEART CATH AND CORONARY ANGIOGRAPHY N/A 10/11/2019   Procedure: RIGHT/LEFT HEART CATH AND CORONARY ANGIOGRAPHY;  Surgeon: Iran Ouch, MD;  Location: [facility];  Service: Cardiovascular;  Laterality: N/A;   SENTINEL NODE BIOPSY Left 02/21/2019   Procedure: Left Sentinel Lymph Node Mapping;  Surgeon: Harriette Bouillon, MD;  Location: [facility];  Service: General;  Laterality: Left;     Allergies  Allergen Reactions   Hydromet [Hydrocodone Bit-Homatrop Mbr] Nausea And Vomiting      Family History  Problem Relation Age of Onset   Breast cancer Sister        diagnosed in her 43s   Stroke Sister    Breast cancer Maternal Grandmother        diagnosed in her 58s or 64s   Breast cancer Sister        diagnosed in her 54s   Stroke Mother    Leukemia Maternal Aunt        diagnosed in her 63s   Colon cancer Neg Hx      Social History Patricia Nunez reports that she has never smoked. She has never used smokeless tobacco. Patricia Nunez reports no history of alcohol use.   Review of Systems CONSTITUTIONAL: No weight loss, fever, chills, weakness or fatigue.  HEENT: Eyes: No visual loss, blurred vision, double vision or yellow sclerae.No hearing loss, sneezing, congestion, runny nose or sore throat.  SKIN: No rash or itching.  CARDIOVASCULAR: per hpi RESPIRATORY: No shortness of breath, cough or sputum.  GASTROINTESTINAL: No anorexia, nausea, vomiting or diarrhea. No abdominal pain or  blood.  GENITOURINARY: No burning on urination, no polyuria NEUROLOGICAL: No headache, dizziness, syncope, paralysis, ataxia, numbness or tingling in the extremities. No change in bowel or bladder control.  MUSCULOSKELETAL: No muscle, back pain, joint pain or stiffness.  LYMPHATICS: No enlarged nodes. No history of splenectomy.  PSYCHIATRIC: No history of depression or anxiety.  ENDOCRINOLOGIC: No reports of sweating, cold or heat intolerance. No polyuria or polydipsia.  Marland Kitchen   Physical Examination Today's Vitals  10/27/22 1551  BP: 124/76  Pulse: 80  SpO2: 98%  Weight: 115 lb (52.2 kg)  Height: 5\' 2"  (1.575 m)   Body mass index is 21.03 kg/m.  Gen: resting comfortably, no acute distress HEENT: no scleral icterus, pupils equal round and reactive, no palptable cervical adenopathy,  CV: RRR, no mrg, no jvd Resp: Clear to auscultation bilaterally GI: abdomen is soft, non-tender, non-distended, normal bowel sounds, no hepatosplenomegaly MSK: extremities are warm, no edema.  Skin: warm, no rash Neuro:  no focal deficits Psych: appropriate affect   Diagnostic Studies  08/2019 echo IMPRESSIONS     1. Left ventricular ejection fraction, by estimation, is 30 to 35%. The  left ventricle has moderately decreased function. The left ventricle  demonstrates global hypokinesis with some regional variation. Left  ventricular diastolic parameters are  indeterminate. Patient appeared to be in sinus tachycardia throughout the  study.   2. Right ventricular systolic function is normal. The right ventricular  size is normal. There is normal pulmonary artery systolic pressure. The  estimated right ventricular systolic pressure is 34.8 mmHg.   3. The mitral valve is grossly normal. Trivial mitral valve  regurgitation.   4. The aortic valve is tricuspid. Aortic valve regurgitation is not  visualized. Mild aortic valve sclerosis is present, with no evidence of  aortic valve stenosis.   5.  The inferior vena cava is normal in size with greater than 50%  respiratory variability, suggesting right atrial pressure of 3 mmHg.      02/2020 echo IMPRESSIONS     1. Left ventricular ejection fraction, by estimation, is 45 to 50%. The  left ventricle has mildly decreased function. The left ventricle  demonstrates global hypokinesis. Left ventricular diastolic parameters  were normal.   2. Right ventricular systolic function is normal. The right ventricular  size is normal. There is normal pulmonary artery systolic pressure. The  estimated right ventricular systolic pressure is 22.9 mmHg.   3. The mitral valve is grossly normal. Trivial mitral valve  regurgitation.   4. The aortic valve is tricuspid. There is mild calcification of the  aortic valve. Aortic valve regurgitation is not visualized.   5. The inferior vena cava is normal in size with greater than 50%  respiratory variability, suggesting right atrial pressure of 3 mmHg.    02/2021 echo IMPRESSIONS     1. Left ventricular ejection fraction, by estimation, is 55 to 60%. The  left ventricle has normal function. The left ventricle has no regional  wall motion abnormalities. Left ventricular diastolic parameters were  normal.   2. Right ventricular systolic function is normal. The right ventricular  size is normal. There is normal pulmonary artery systolic pressure.   3. The mitral valve is normal in structure. No evidence of mitral valve  regurgitation. No evidence of mitral stenosis.   4. The tricuspid valve is abnormal.   5. The aortic valve is tricuspid. There is mild calcification of the  aortic valve. There is mild thickening of the aortic valve. Aortic valve  regurgitation is not visualized. No aortic stenosis is present.   6. The inferior vena cava is normal in size with greater than 50%  respiratory variability, suggesting right atrial pressure of 3 mmHg.    Assessment and Plan   1. Chronic systolic  HF/HFimpEF - LVEF has normalized - she denies any recent symptoms - continue current meds  EKG today shows NSR  F/u 1 year, request labs from pcp    Erwin  Margy Clarks, M.D.

## 2022-10-27 NOTE — Patient Instructions (Signed)
Medication Instructions:  Your physician recommends that you continue on your current medications as directed. Please refer to the Current Medication list given to you today.  *If you need a refill on your cardiac medications before your next appointment, please call your pharmacy*   Lab Work: None If you have labs (blood work) drawn today and your tests are completely normal, you will receive your results only by: MyChart Message (if you have MyChart) OR A paper copy in the mail If you have any lab test that is abnormal or we need to change your treatment, we will call you to review the results.   Testing/Procedures: None   Follow-Up: At [facility], you and your health needs are our priority.  As part of our continuing mission to provide you with exceptional heart care, we have created designated Provider Care Teams.  These Care Teams include your primary Cardiologist (physician) and Advanced Practice Providers (APPs -  Physician Assistants and Nurse Practitioners) who all work together to provide you with the care you need, when you need it.  We recommend signing up for the patient portal called "MyChart".  Sign up information is provided on this After Visit Summary.  MyChart is used to connect with patients for Virtual Visits (Telemedicine).  Patients are able to view lab/test results, encounter notes, upcoming appointments, etc.  Non-urgent messages can be sent to your provider as well.   To learn more about what you can do with MyChart, go to [email or web address].    Your next appointment:   1 year(s)  Provider:   Jonathan Branch, MD    Other Instructions    

## 2022-10-28 ENCOUNTER — Encounter: Payer: Self-pay | Admitting: Family Medicine

## 2022-10-29 ENCOUNTER — Other Ambulatory Visit: Payer: Self-pay | Admitting: Allergy & Immunology

## 2022-11-01 ENCOUNTER — Ambulatory Visit
Admission: RE | Admit: 2022-11-01 | Discharge: 2022-11-01 | Disposition: A | Discharge status: Home / Self Care | Payer: Medicare PPO | Source: Ambulatory Visit | Attending: Family Medicine | Admitting: Family Medicine

## 2022-11-01 DIAGNOSIS — Z1231 Encounter for screening mammogram for malignant neoplasm of breast: Secondary | ICD-10-CM

## 2022-11-04 DIAGNOSIS — Z23 Encounter for immunization: Secondary | ICD-10-CM | POA: Diagnosis not present

## 2022-11-04 DIAGNOSIS — J4550 Severe persistent asthma, uncomplicated: Secondary | ICD-10-CM | POA: Diagnosis not present

## 2022-11-04 DIAGNOSIS — M25542 Pain in joints of left hand: Secondary | ICD-10-CM | POA: Diagnosis not present

## 2022-11-04 DIAGNOSIS — M25541 Pain in joints of right hand: Secondary | ICD-10-CM | POA: Diagnosis not present

## 2022-11-05 ENCOUNTER — Other Ambulatory Visit: Payer: Self-pay | Admitting: Allergy & Immunology

## 2022-11-07 DIAGNOSIS — J45909 Unspecified asthma, uncomplicated: Secondary | ICD-10-CM | POA: Diagnosis not present

## 2022-11-07 DIAGNOSIS — I10 Essential (primary) hypertension: Secondary | ICD-10-CM | POA: Diagnosis not present

## 2022-11-07 DIAGNOSIS — I5022 Chronic systolic (congestive) heart failure: Secondary | ICD-10-CM | POA: Diagnosis not present

## 2022-11-07 DIAGNOSIS — M65311 Trigger thumb, right thumb: Secondary | ICD-10-CM | POA: Diagnosis not present

## 2022-11-07 DIAGNOSIS — E039 Hypothyroidism, unspecified: Secondary | ICD-10-CM | POA: Diagnosis not present

## 2022-11-09 ENCOUNTER — Other Ambulatory Visit: Payer: Self-pay | Admitting: Allergy & Immunology

## 2022-11-10 DIAGNOSIS — M25542 Pain in joints of left hand: Secondary | ICD-10-CM | POA: Diagnosis not present

## 2022-11-10 DIAGNOSIS — M25541 Pain in joints of right hand: Secondary | ICD-10-CM | POA: Diagnosis not present

## 2022-11-10 DIAGNOSIS — J4550 Severe persistent asthma, uncomplicated: Secondary | ICD-10-CM | POA: Diagnosis not present

## 2022-11-22 NOTE — Progress Notes (Signed)
Patient Care Team: Mirna Mires, MD as PCP - General (Family Medicine) Wyline Mood, Dorothe Pea, MD as PCP - Cardiology (Cardiology) Serena Croissant, MD as Consulting Physician (Hematology and Oncology) Antony Blackbird, MD as Consulting Physician (Radiation Oncology) Harriette Bouillon, MD as Consulting Physician (General Surgery) Dorisann Frames, MD as Referring Physician (Endocrinology)  DIAGNOSIS:  Encounter Diagnosis  Name Primary?   Malignant neoplasm of upper-outer quadrant of left breast in female, estrogen receptor positive (HCC) Yes    SUMMARY OF ONCOLOGIC HISTORY: Oncology History  Malignant neoplasm of upper-outer quadrant of left breast in female, estrogen receptor positive (HCC)  01/22/2019 Initial Diagnosis   Patient palpated a left breast mass. Mammogram showed a 0.9cm mass in the left breast at the 3 o'clock position, no left axillary adenopathy. 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    02/07/2019 Genetic Testing   Negative genetic testing: no pathogenic variants identified on the [facility] Breast Cancer STAT panel + Common Hereditary Cancers panel. 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 The report date is 02/07/2019.  The STAT Breast cancer panel offered by [facility] includes sequencing and rearrangement analysis for the following 9 genes:  ATM, BRCA1, BRCA2, CDH1, CHEK2, PALB2, PTEN, STK11 and TP53.  The Common Hereditary Cancers Panel offered by [facility] includes sequencing and/or deletion duplication testing of the following 47 genes: APC, ATM, AXIN2, BARD1, BMPR1A, BRCA1, BRCA2, BRIP1, CDH1, CDK4, CDKN2A (p14ARF), CDKN2A (p16INK4a), CHEK2, CTNNA1, DICER1, EPCAM (Deletion/duplication testing only), GREM1 (promoter region deletion/duplication testing only), KIT, MEN1, MLH1, MSH2, MSH3, MSH6, MUTYH, NBN, NF1, NHTL1, PALB2, PDGFRA, PMS2, POLD1, POLE, PTEN, RAD50, RAD51C, RAD51D, SDHB, SDHC, SDHD, SMAD4, SMARCA4.  STK11, TP53, TSC1, TSC2, and VHL.  The following genes were evaluated for sequence changes only: SDHA and HOXB13 c.251G>A variant only.   02/21/2019 Surgery   Left lumpectomy ([facility]) ([id number]): IDC, 0.9cm, grade 2, clear margins, 2 left axillary lymph nodes negative.   02/21/2019 Cancer Staging   Staging form: Breast, AJCC 8th Edition - Pathologic stage from 02/21/2019: Stage IA (pT1b, pN0, cM0, G2, ER+, PR+, HER2-)    03/04/2019 Oncotype testing   The Oncotype DX score was 3 predicting a risk of outside the breast recurrence over the next 9 years of 3% if the patient's only systemic therapy is tamoxifen for 5 years.     04/01/2019 - 04/30/2019 Radiation Therapy   The patient initially received a dose of 40.05 Gy in 15 fractions to the breast using whole-breast tangent fields. This was delivered using a 3-D conformal technique. The pt received a boost delivering an additional 10 Gy in 5 fractions using a electron boost with electrons. The total dose was 50.05 Gy.    04/2019 - 04/2024 Anti-estrogen oral therapy   Anastrozole     CHIEF COMPLIANT: Follow-up on anastrozole therapy   INTERVAL HISTORY: Patricia Nunez is a 67 year old with above-mentioned history of left breast cancer treated with lumpectomy radiation and is currently on antiestrogen therapy with anastrozole. Patient reports that she is tolerating anastrozole fairly well. She states hot flashes is bearable.   ALLERGIES:  is allergic to hydromet [hydrocodone bit-homatrop mbr].  MEDICATIONS:  Current Outpatient Medications  Medication Sig Dispense Refill   albuterol (VENTOLIN HFA) 108 (90 Base) MCG/ACT inhaler Inhale 1 puff into the lungs every 4 (four) hours as needed for wheezing or shortness of breath.     alendronate (FOSAMAX) 70 MG tablet Take 70 mg by mouth  every Sunday.      Ascorbic Acid (VITAMIN C) 500 MG CAPS See admin instructions.     Azelastine HCl 0.15 % SOLN Place into the nose in the morning and at  bedtime.     b complex vitamins tablet Take 1 tablet by mouth daily.     calcium carbonate (OS-CAL) 600 MG TABS Take 600 mg by mouth daily with breakfast.     cholecalciferol (VITAMIN D3) 25 MCG (1000 UT) tablet Take 1,000 Units by mouth daily.     Fluticasone-Umeclidin-Vilant (TRELEGY ELLIPTA) 100-62.5-25 MCG/ACT AEPB Inhale into the lungs.     levocetirizine (XYZAL) 5 MG tablet TAKE 1 TABLET BY MOUTH ONCE DAILY IN THE EVENING 30 tablet 5   melatonin 3 MG TABS tablet 1 tablet at bedtime as needed     metoprolol succinate (TOPROL-XL) 25 MG 24 hr tablet TAKE 1 & 1/2 (ONE & ONE-HALF) TABLETS BY MOUTH ONCE DAILY 45 tablet 3   Multiple Vitamin (MULTIVITAMIN) capsule Take 1 capsule by mouth daily.     pantoprazole (PROTONIX) 40 MG tablet Take 40 mg by mouth daily.     sacubitril-valsartan (ENTRESTO) 24-26 MG Take 1 tablet by mouth 2 (two) times daily. 180 tablet 3   TRELEGY ELLIPTA 200-62.5-25 MCG/ACT AEPB SMARTSIG:1 Inhalation By Mouth Daily     vitamin E 100 UNIT capsule Take 100 Units by mouth daily.     zinc gluconate 50 MG tablet Take 50 mg by mouth daily.     anastrozole (ARIMIDEX) 1 MG tablet Take 1 tablet (1 mg total) by mouth daily. 90 tablet 3   montelukast (SINGULAIR) 10 MG tablet Take 1 tablet (10 mg total) by mouth at bedtime. 30 tablet 5   No current facility-administered medications for this visit.    PHYSICAL EXAMINATION: ECOG PERFORMANCE STATUS: 1 - Symptomatic but completely ambulatory  Vitals:   11/28/22 1058  BP: 110/66  Pulse: 84  Resp: 17  Temp: 97.9 F (36.6 C)  SpO2: 99%   Filed Weights   11/28/22 1058  Weight: 115 lb 8 oz (52.4 kg)    BREAST: No palpable masses or nodules in either right or left breasts. No palpable axillary supraclavicular or infraclavicular adenopathy no breast tenderness or nipple discharge. (exam performed in the presence of a chaperone)  LABORATORY DATA:  I have reviewed the data as listed    Latest Ref Rng & Units 05/06/2020   10:39  AM 10/11/2019    8:23 AM 10/11/2019    8:20 AM  CMP  Glucose 70 - 99 mg/dL 161     BUN 8 - 23 mg/dL 17     Creatinine 0.96 - 1.00 mg/dL 0.45     Sodium 409 - 811 mmol/L 138  142  142    141   Potassium 3.5 - 5.1 mmol/L 3.7  3.6  3.6    3.7   Chloride 98 - 111 mmol/L 101     CO2 22 - 32 mmol/L 28     Calcium 8.9 - 10.3 mg/dL 9.0     Total Protein 6.5 - 8.1 g/dL 7.2     Total Bilirubin 0.3 - 1.2 mg/dL 0.3     Alkaline Phos 38 - 126 U/L 50     AST 15 - 41 U/L 29     ALT 0 - 44 U/L 26       Lab Results  Component Value Date   WBC 6.8 05/06/2020   HGB 14.1 05/06/2020   HCT 44.7 05/06/2020  MCV 94.1 05/06/2020   PLT 246 05/06/2020   NEUTROABS 5.3 05/06/2020    ASSESSMENT & PLAN:  Malignant neoplasm of upper-outer quadrant of left breast in female, estrogen receptor positive (HCC) 01/22/2019:Patient palpated a left breast mass. Mammogram showed a 0.9cm mass in the left breast at the 3 o'clock position, no left axillary adenopathy. 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  T1BN0 stage Ia   Recommendations: 1. 02/21/2019: Left lumpectomy: Grade 2 IDC 0.9 cm with clear margins, 0/2 lymph nodes negative, ER 95%, PR 95%, Ki-67 15%, HER-2 negative, T1b N0 stage Ia 2. Oncotype DX: Recurrence score 3, distant recurrence at 9 years: 3% 3. Adjuvant radiation therapy 04/02/2019-04/30/2019 4. Adjuvant antiestrogen therapy with anastrozole 1 mg daily started January 2021 5.  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  She has mild hot flashes and joint stiffness from before.  These are unchanged.   Breast cancer surveillance: 1.  Breast exam 11/28/2022: Benign 2. Mammogram 11/02/2022: Benign breast density category C   Currently on Fosamax with calcium and vitamin D   Heart  catheterization 10/11/2019: LV dysfunction EF 40% follows with cardiology and apparently its gotten significantly better. Return to clinic in 1 year for follow-up    No orders of the defined types were placed in this encounter.  The patient has a good understanding of the overall plan. she agrees with it. she will call with any problems that may develop before the next visit here. Total time spent: 30 mins including face to face time and time spent for planning, charting and co-ordination of care   Tamsen Meek, MD 11/28/22    I Janan Ridge am acting as a Neurosurgeon for [employer]  I have reviewed the above documentation for accuracy and completeness, and I agree with the above.

## 2022-11-23 ENCOUNTER — Other Ambulatory Visit: Payer: Self-pay | Admitting: Allergy & Immunology

## 2022-11-27 ENCOUNTER — Other Ambulatory Visit: Payer: Self-pay | Admitting: Cardiology

## 2022-11-27 DIAGNOSIS — R0902 Hypoxemia: Secondary | ICD-10-CM | POA: Diagnosis not present

## 2022-11-27 DIAGNOSIS — J449 Chronic obstructive pulmonary disease, unspecified: Secondary | ICD-10-CM | POA: Diagnosis not present

## 2022-11-28 ENCOUNTER — Other Ambulatory Visit: Payer: Self-pay

## 2022-11-28 ENCOUNTER — Inpatient Hospital Stay: Payer: Medicare PPO | Attending: Hematology and Oncology | Admitting: Hematology and Oncology

## 2022-11-28 VITALS — BP 110/66 | HR 84 | Temp 97.9°F | Resp 17 | Wt 115.5 lb

## 2022-11-28 DIAGNOSIS — C50412 Malignant neoplasm of upper-outer quadrant of left female breast: Secondary | ICD-10-CM | POA: Insufficient documentation

## 2022-11-28 DIAGNOSIS — Z79811 Long term (current) use of aromatase inhibitors: Secondary | ICD-10-CM | POA: Diagnosis not present

## 2022-11-28 DIAGNOSIS — Z170 Estrogen receptor positive status [ER+]: Secondary | ICD-10-CM | POA: Insufficient documentation

## 2022-11-28 MED ORDER — ANASTROZOLE 1 MG PO TABS: 1.0000 mg | ORAL_TABLET | Freq: Every day | ORAL | 3 refills | Status: DC

## 2022-11-28 NOTE — Assessment & Plan Note (Addendum)
01/22/2019:Patient palpated a left breast mass. Mammogram showed a 0.9cm mass in the left breast at the 3 o'clock position, no left axillary adenopathy. 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  T1BN0 stage Ia   Recommendations: 1. 02/21/2019: Left lumpectomy: Grade 2 IDC 0.9 cm with clear margins, 0/2 lymph nodes negative, ER 95%, PR 95%, Ki-67 15%, HER-2 negative, T1b N0 stage Ia 2. Oncotype DX: Recurrence score 3, distant recurrence at 9 years: 3% 3. Adjuvant radiation therapy 04/02/2019-04/30/2019 4. Adjuvant antiestrogen therapy with anastrozole 1 mg daily started January 2021 5.  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  She has mild hot flashes and joint stiffness from before.  These are unchanged.   Breast cancer surveillance: 1.  Breast exam 11/28/2022: Benign 2. Mammogram 11/02/2022: Benign breast density category C   Currently on Fosamax with calcium and vitamin D   Heart catheterization 10/11/2019: LV dysfunction EF 40% follows with cardiology and apparently its gotten significantly better. Return to clinic in 1 year for follow-up

## 2022-12-16 ENCOUNTER — Other Ambulatory Visit: Payer: Self-pay | Admitting: Allergy & Immunology

## 2022-12-22 ENCOUNTER — Other Ambulatory Visit: Payer: Self-pay | Admitting: Allergy & Immunology

## 2022-12-28 DIAGNOSIS — J449 Chronic obstructive pulmonary disease, unspecified: Secondary | ICD-10-CM | POA: Diagnosis not present

## 2022-12-28 DIAGNOSIS — R0902 Hypoxemia: Secondary | ICD-10-CM | POA: Diagnosis not present

## 2023-01-16 ENCOUNTER — Ambulatory Visit: Payer: Medicare PPO | Admitting: Cardiology

## 2023-01-27 DIAGNOSIS — R0902 Hypoxemia: Secondary | ICD-10-CM | POA: Diagnosis not present

## 2023-01-27 DIAGNOSIS — J449 Chronic obstructive pulmonary disease, unspecified: Secondary | ICD-10-CM | POA: Diagnosis not present

## 2023-02-27 DIAGNOSIS — R0902 Hypoxemia: Secondary | ICD-10-CM | POA: Diagnosis not present

## 2023-02-27 DIAGNOSIS — J449 Chronic obstructive pulmonary disease, unspecified: Secondary | ICD-10-CM | POA: Diagnosis not present

## 2023-03-07 ENCOUNTER — Telehealth: Payer: Self-pay | Admitting: *Deleted

## 2023-03-07 DIAGNOSIS — M65311 Trigger thumb, right thumb: Secondary | ICD-10-CM | POA: Diagnosis not present

## 2023-03-07 DIAGNOSIS — I10 Essential (primary) hypertension: Secondary | ICD-10-CM | POA: Diagnosis not present

## 2023-03-07 DIAGNOSIS — M65312 Trigger thumb, left thumb: Secondary | ICD-10-CM | POA: Diagnosis not present

## 2023-03-07 DIAGNOSIS — J45909 Unspecified asthma, uncomplicated: Secondary | ICD-10-CM | POA: Diagnosis not present

## 2023-03-07 DIAGNOSIS — E039 Hypothyroidism, unspecified: Secondary | ICD-10-CM | POA: Diagnosis not present

## 2023-03-07 NOTE — Telephone Encounter (Signed)
Pt denied for [employer] patient assistance until she can provide Extra help denial letter. Pt notified and voiced understanding.

## 2023-03-29 ENCOUNTER — Ambulatory Visit: Payer: Self-pay | Admitting: Family Medicine

## 2023-03-29 DIAGNOSIS — J449 Chronic obstructive pulmonary disease, unspecified: Secondary | ICD-10-CM | POA: Diagnosis not present

## 2023-03-29 DIAGNOSIS — R0902 Hypoxemia: Secondary | ICD-10-CM | POA: Diagnosis not present

## 2023-04-08 ENCOUNTER — Other Ambulatory Visit: Payer: Self-pay | Admitting: Cardiology

## 2023-04-21 ENCOUNTER — Telehealth: Payer: Self-pay | Admitting: Family Medicine

## 2023-04-21 NOTE — Telephone Encounter (Signed)
 Copied from CRM [id number]. Topic: Referral - Status >> Apr 21, 2023 12:01 PM Whitney O wrote: Reason for CRM: patient calling to see if Dr Lodema Hong received a referral from Dr Earvin Hansen hill for patient to be seen by Dr Lodema Hong. Call back number [phone]

## 2023-04-27 DIAGNOSIS — H35371 Puckering of macula, right eye: Secondary | ICD-10-CM | POA: Diagnosis not present

## 2023-04-29 DIAGNOSIS — J449 Chronic obstructive pulmonary disease, unspecified: Secondary | ICD-10-CM | POA: Diagnosis not present

## 2023-04-29 DIAGNOSIS — R0902 Hypoxemia: Secondary | ICD-10-CM | POA: Diagnosis not present

## 2023-05-01 ENCOUNTER — Telehealth: Payer: Self-pay | Admitting: Family Medicine

## 2023-05-01 NOTE — Telephone Encounter (Signed)
 Pt advised.

## 2023-05-01 NOTE — Telephone Encounter (Signed)
 Dr Lodema Hong does not see any New patients. Pls let her know she can establish with one of the other providers that are.

## 2023-05-01 NOTE — Telephone Encounter (Signed)
 Copied from CRM [id number]. Topic: Referral - Status >> Apr 28, 2023  2:11 PM Twila L wrote: Reason for CRM:  Patient still has not recv calll back about status of ref to Dr Lodema Hong  see crm [phone]

## 2023-05-04 NOTE — Telephone Encounter (Signed)
Patient has already been advised and has been scheduled with accepting provider in office

## 2023-05-05 DIAGNOSIS — J4550 Severe persistent asthma, uncomplicated: Secondary | ICD-10-CM | POA: Diagnosis not present

## 2023-05-05 DIAGNOSIS — J4540 Moderate persistent asthma, uncomplicated: Secondary | ICD-10-CM | POA: Diagnosis not present

## 2023-05-30 DIAGNOSIS — R0902 Hypoxemia: Secondary | ICD-10-CM | POA: Diagnosis not present

## 2023-05-30 DIAGNOSIS — J449 Chronic obstructive pulmonary disease, unspecified: Secondary | ICD-10-CM | POA: Diagnosis not present

## 2023-06-06 DIAGNOSIS — M65311 Trigger thumb, right thumb: Secondary | ICD-10-CM | POA: Diagnosis not present

## 2023-06-06 DIAGNOSIS — J45909 Unspecified asthma, uncomplicated: Secondary | ICD-10-CM | POA: Diagnosis not present

## 2023-06-06 DIAGNOSIS — M65312 Trigger thumb, left thumb: Secondary | ICD-10-CM | POA: Diagnosis not present

## 2023-06-06 DIAGNOSIS — E039 Hypothyroidism, unspecified: Secondary | ICD-10-CM | POA: Diagnosis not present

## 2023-06-06 DIAGNOSIS — I10 Essential (primary) hypertension: Secondary | ICD-10-CM | POA: Diagnosis not present

## 2023-06-09 ENCOUNTER — Ambulatory Visit: Payer: Medicare PPO | Admitting: Family Medicine

## 2023-06-09 ENCOUNTER — Telehealth: Payer: Self-pay | Admitting: Cardiology

## 2023-06-09 NOTE — Telephone Encounter (Signed)
 I will forward to med assist

## 2023-06-09 NOTE — Telephone Encounter (Signed)
 Pt c/o medication issue:  1. Name of Medication:   sacubitril-valsartan (ENTRESTO) 24-26 MG   2. How are you currently taking this medication (dosage and times per day)?   As prescribed  3. Are you having a reaction (difficulty breathing--STAT)?   No  4. What is your medication issue?   Patient called to report to RN Bradly Bienenstock that she has been denied this medication with Cover My Meds and wants advice on next steps.

## 2023-06-13 ENCOUNTER — Telehealth: Payer: Self-pay

## 2023-06-13 ENCOUNTER — Other Ambulatory Visit (HOSPITAL_COMMUNITY): Payer: Self-pay

## 2023-06-13 NOTE — Telephone Encounter (Addendum)
 Patient Advocate Encounter   The patient was approved for a [employer] grant that will help cover the cost of ENTRESTO Total amount awarded, $10,000.  Effective: 05/14/23 - 05/12/24   [id number]  Patient informed via Dorcas Carrow, CPhT  Pharmacy Patient Advocate Specialist  Direct Number: [phone] Fax: [phone]

## 2023-06-27 DIAGNOSIS — R0902 Hypoxemia: Secondary | ICD-10-CM | POA: Diagnosis not present

## 2023-06-27 DIAGNOSIS — J449 Chronic obstructive pulmonary disease, unspecified: Secondary | ICD-10-CM | POA: Diagnosis not present

## 2023-07-03 DIAGNOSIS — E039 Hypothyroidism, unspecified: Secondary | ICD-10-CM | POA: Diagnosis not present

## 2023-07-03 DIAGNOSIS — M810 Age-related osteoporosis without current pathological fracture: Secondary | ICD-10-CM | POA: Diagnosis not present

## 2023-07-10 DIAGNOSIS — E039 Hypothyroidism, unspecified: Secondary | ICD-10-CM | POA: Diagnosis not present

## 2023-07-10 DIAGNOSIS — M810 Age-related osteoporosis without current pathological fracture: Secondary | ICD-10-CM | POA: Diagnosis not present

## 2023-07-10 DIAGNOSIS — I10 Essential (primary) hypertension: Secondary | ICD-10-CM | POA: Diagnosis not present

## 2023-07-10 DIAGNOSIS — E063 Autoimmune thyroiditis: Secondary | ICD-10-CM | POA: Diagnosis not present

## 2023-07-10 DIAGNOSIS — C50919 Malignant neoplasm of unspecified site of unspecified female breast: Secondary | ICD-10-CM | POA: Diagnosis not present

## 2023-09-12 DIAGNOSIS — E039 Hypothyroidism, unspecified: Secondary | ICD-10-CM | POA: Diagnosis not present

## 2023-09-12 DIAGNOSIS — J301 Allergic rhinitis due to pollen: Secondary | ICD-10-CM | POA: Diagnosis not present

## 2023-09-12 DIAGNOSIS — N390 Urinary tract infection, site not specified: Secondary | ICD-10-CM | POA: Diagnosis not present

## 2023-09-12 DIAGNOSIS — J45909 Unspecified asthma, uncomplicated: Secondary | ICD-10-CM | POA: Diagnosis not present

## 2023-09-12 DIAGNOSIS — I10 Essential (primary) hypertension: Secondary | ICD-10-CM | POA: Diagnosis not present

## 2023-09-12 DIAGNOSIS — I5022 Chronic systolic (congestive) heart failure: Secondary | ICD-10-CM | POA: Diagnosis not present

## 2023-09-14 DIAGNOSIS — I5022 Chronic systolic (congestive) heart failure: Secondary | ICD-10-CM | POA: Diagnosis not present

## 2023-09-14 DIAGNOSIS — J45909 Unspecified asthma, uncomplicated: Secondary | ICD-10-CM | POA: Diagnosis not present

## 2023-09-29 ENCOUNTER — Other Ambulatory Visit: Payer: Self-pay | Admitting: Family Medicine

## 2023-09-29 DIAGNOSIS — Z1231 Encounter for screening mammogram for malignant neoplasm of breast: Secondary | ICD-10-CM

## 2023-10-20 ENCOUNTER — Other Ambulatory Visit: Payer: Self-pay | Admitting: Cardiology

## 2023-11-02 ENCOUNTER — Ambulatory Visit

## 2023-11-02 ENCOUNTER — Ambulatory Visit
Admission: RE | Admit: 2023-11-02 | Discharge: 2023-11-02 | Disposition: A | Discharge status: Home / Self Care | Source: Ambulatory Visit | Attending: Family Medicine | Admitting: Family Medicine

## 2023-11-02 DIAGNOSIS — Z1231 Encounter for screening mammogram for malignant neoplasm of breast: Secondary | ICD-10-CM | POA: Diagnosis not present

## 2023-11-18 ENCOUNTER — Other Ambulatory Visit: Payer: Self-pay | Admitting: Cardiology

## 2023-11-28 ENCOUNTER — Inpatient Hospital Stay: Payer: Medicare PPO | Attending: Hematology and Oncology | Admitting: Hematology and Oncology

## 2023-11-28 VITALS — BP 113/64 | HR 80 | Temp 97.9°F | Resp 18 | Ht 62.0 in | Wt 115.6 lb

## 2023-11-28 DIAGNOSIS — Z1721 Progesterone receptor positive status: Secondary | ICD-10-CM | POA: Diagnosis not present

## 2023-11-28 DIAGNOSIS — Z79899 Other long term (current) drug therapy: Secondary | ICD-10-CM | POA: Diagnosis not present

## 2023-11-28 DIAGNOSIS — Z170 Estrogen receptor positive status [ER+]: Secondary | ICD-10-CM | POA: Insufficient documentation

## 2023-11-28 DIAGNOSIS — Z79811 Long term (current) use of aromatase inhibitors: Secondary | ICD-10-CM | POA: Diagnosis not present

## 2023-11-28 DIAGNOSIS — M810 Age-related osteoporosis without current pathological fracture: Secondary | ICD-10-CM | POA: Insufficient documentation

## 2023-11-28 DIAGNOSIS — C50412 Malignant neoplasm of upper-outer quadrant of left female breast: Secondary | ICD-10-CM | POA: Diagnosis present

## 2023-11-28 DIAGNOSIS — Z923 Personal history of irradiation: Secondary | ICD-10-CM | POA: Insufficient documentation

## 2023-11-28 MED ORDER — ANASTROZOLE 1 MG PO TABS: 1.0000 mg | ORAL_TABLET | Freq: Every day | ORAL | 3 refills | Status: AC

## 2023-11-28 NOTE — Assessment & Plan Note (Signed)
 01/22/2019:Patient palpated a left breast mass. Mammogram showed a 0.9cm mass in the left breast at the 3 o'clock position, no left axillary adenopathy. 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  T1BN0 stage Ia   Recommendations: 1. 02/21/2019: Left lumpectomy: Grade 2 IDC 0.9 cm with clear margins, 0/2 lymph nodes negative, ER 95%, PR 95%, Ki-67 15%, HER-2 negative, T1b N0 stage Ia 2. Oncotype DX: Recurrence score 3, distant recurrence at 9 years: 3% 3. Adjuvant radiation therapy 04/02/2019-04/30/2019 4. Adjuvant antiestrogen therapy with anastrozole  1 mg daily started January 2021 5.  Genetics: 02/07/2019: VUS BRCA2 ------------------------------------------------------------------------------------------------------------------------------------------------- Anastrozole  toxicities: Denies any adverse effects.  She has mild hot flashes and joint stiffness from before.  These are unchanged.   Breast cancer surveillance: 1.  Breast exam 11/28/2023: Benign 2. Mammogram 11/07/2023: Benign breast density category C   Currently on Fosamax  with calcium and vitamin D   Heart catheterization 10/11/2019: LV dysfunction EF 40% follows with cardiology and apparently its gotten significantly better. Return to clinic in 1 year for follow-up

## 2023-11-28 NOTE — Progress Notes (Signed)
 Patient Care Team: Leigh Lung, MD as PCP - General (Family Medicine) Alvan, Dorn FALCON, MD as PCP - Cardiology (Cardiology) Odean Potts, MD as Consulting Physician (Hematology and Oncology) Shannon Agent, MD as Consulting Physician (Radiation Oncology) Vanderbilt Ned, MD as Consulting Physician (General Surgery) Tommas Pears, MD as Referring Physician (Endocrinology)  DIAGNOSIS:  Encounter Diagnosis  Name Primary?   Malignant neoplasm of upper-outer quadrant of left breast in female, estrogen receptor positive (HCC) Yes    SUMMARY OF ONCOLOGIC HISTORY: Oncology History  Malignant neoplasm of upper-outer quadrant of left breast in female, estrogen receptor positive (HCC)  01/22/2019 Initial Diagnosis   Patient palpated a left breast mass. Mammogram showed a 0.9cm mass in the left breast at the 3 o'clock position, no left axillary adenopathy. 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    02/07/2019 Genetic Testing   Negative genetic testing: no pathogenic variants identified on the [facility] Breast Cancer STAT panel + Common Hereditary Cancers panel. 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 The report date is 02/07/2019.  The STAT Breast cancer panel offered by [facility] includes sequencing and rearrangement analysis for the following 9 genes:  ATM, BRCA1, BRCA2, CDH1, CHEK2, PALB2, PTEN, STK11 and TP53.  The Common Hereditary Cancers Panel offered by [facility] includes sequencing and/or deletion duplication testing of the following 47 genes: APC, ATM, AXIN2, BARD1, BMPR1A, BRCA1, BRCA2, BRIP1, CDH1, CDK4, CDKN2A (p14ARF), CDKN2A (p16INK4a), CHEK2, CTNNA1, DICER1, EPCAM (Deletion/duplication testing only), GREM1 (promoter region deletion/duplication testing only), KIT, MEN1, MLH1, MSH2, MSH3, MSH6, MUTYH, NBN, NF1, NHTL1, PALB2, PDGFRA, PMS2, POLD1, POLE, PTEN, RAD50, RAD51C, RAD51D, SDHB, SDHC, SDHD, SMAD4, SMARCA4.  STK11, TP53, TSC1, TSC2, and VHL.  The following genes were evaluated for sequence changes only: SDHA and HOXB13 c.251G>A variant only.   02/21/2019 Surgery   Left lumpectomy ([facility]) ([id number]): IDC, 0.9cm, grade 2, clear margins, 2 left axillary lymph nodes negative.   02/21/2019 Cancer Staging   Staging form: Breast, AJCC 8th Edition - Pathologic stage from 02/21/2019: Stage IA (pT1b, pN0, cM0, G2, ER+, PR+, HER2-)    03/04/2019 Oncotype testing   The Oncotype DX score was 3 predicting a risk of outside the breast recurrence over the next 9 years of 3% if the patient's only systemic therapy is tamoxifen for 5 years.     04/01/2019 - 04/30/2019 Radiation Therapy   The patient initially received a dose of 40.05 Gy in 15 fractions to the breast using whole-breast tangent fields. This was delivered using a 3-D conformal technique. The pt received a boost delivering an additional 10 Gy in 5 fractions using a electron boost with electrons. The total dose was 50.05 Gy.    04/2019 - 04/2024 Anti-estrogen oral therapy   Anastrozole      CHIEF COMPLIANT: Surveillance of breast cancer on anastrozole  therapy  HISTORY OF PRESENT ILLNESS:  History of Present Illness Patricia Nunez is a 68 year old female with breast cancer who presents for a follow-up visit.  Her health remains stable on anastrozole  with mild hot flashes managed by dietary adjustments. Her last mammogram on July 22nd showed dense breast tissue, category C. She is on Fosamax  for bone health and plans to have her next bone density test next year.     ALLERGIES:  is allergic to hydromet [hydrocodone bit-homatrop mbr].  MEDICATIONS:  Current Outpatient Medications  Medication Sig Dispense Refill   albuterol  (VENTOLIN  HFA) 108 (90 Base)  MCG/ACT inhaler Inhale 1 puff into the lungs every 4 (four) hours as needed for wheezing or shortness of breath.     alendronate  (FOSAMAX ) 70 MG tablet Take 70 mg by mouth every Sunday.       anastrozole  (ARIMIDEX ) 1 MG tablet Take 1 tablet (1 mg total) by mouth daily. 90 tablet 3   Ascorbic Acid (VITAMIN C) 500 MG CAPS See admin instructions.     Azelastine HCl 0.15 % SOLN Place into the nose in the morning and at bedtime.     b complex vitamins tablet Take 1 tablet by mouth daily.     calcium carbonate (OS-CAL) 600 MG TABS Take 600 mg by mouth daily with breakfast.     cholecalciferol (VITAMIN D3) 25 MCG (1000 UT) tablet Take 1,000 Units by mouth daily.     Fluticasone-Umeclidin-Vilant (TRELEGY ELLIPTA) 100-62.5-25 MCG/ACT AEPB Inhale into the lungs.     levocetirizine (XYZAL ) 5 MG tablet TAKE 1 TABLET BY MOUTH ONCE DAILY IN THE EVENING 30 tablet 5   melatonin 3 MG TABS tablet 1 tablet at bedtime as needed     metoprolol  succinate (TOPROL -XL) 25 MG 24 hr tablet Take 1.5 tablets (37.5 mg total) by mouth daily. 45 tablet 0   montelukast  (SINGULAIR ) 10 MG tablet Take 1 tablet (10 mg total) by mouth at bedtime. 30 tablet 5   Multiple Vitamin (MULTIVITAMIN) capsule Take 1 capsule by mouth daily.     pantoprazole  (PROTONIX ) 40 MG tablet Take 40 mg by mouth daily.     sacubitril-valsartan (ENTRESTO ) 24-26 MG Take 1 tablet by mouth 2 (two) times daily. 180 tablet 3   TRELEGY ELLIPTA 200-62.5-25 MCG/ACT AEPB SMARTSIG:1 Inhalation By Mouth Daily     vitamin E 100 UNIT capsule Take 100 Units by mouth daily.     zinc gluconate 50 MG tablet Take 50 mg by mouth daily.     No current facility-administered medications for this visit.    PHYSICAL EXAMINATION: ECOG PERFORMANCE STATUS: 1 - Symptomatic but completely ambulatory  Vitals:   11/28/23 1118  BP: 113/64  Pulse: 80  Resp: 18  Temp: 97.9 F (36.6 C)  SpO2: 100%   Filed Weights   11/28/23 1118  Weight: 115 lb 9.6 oz (52.4 kg)    Physical Exam No palpable lumps nodules in bilateral breasts or axilla  (exam performed in the presence of a chaperone)  LABORATORY DATA:  I have reviewed the data as listed    Latest Ref  Rng & Units 05/06/2020   10:39 AM 10/11/2019    8:23 AM 10/11/2019    8:20 AM  CMP  Glucose 70 - 99 mg/dL 837     BUN 8 - 23 mg/dL 17     Creatinine 9.55 - 1.00 mg/dL 9.22     Sodium 864 - 854 mmol/L 138  142  142    141   Potassium 3.5 - 5.1 mmol/L 3.7  3.6  3.6    3.7   Chloride 98 - 111 mmol/L 101     CO2 22 - 32 mmol/L 28     Calcium 8.9 - 10.3 mg/dL 9.0     Total Protein 6.5 - 8.1 g/dL 7.2     Total Bilirubin 0.3 - 1.2 mg/dL 0.3     Alkaline Phos 38 - 126 U/L 50     AST 15 - 41 U/L 29     ALT 0 - 44 U/L 26       Lab Results  Component Value Date   WBC 6.8 05/06/2020   HGB 14.1 05/06/2020   HCT 44.7 05/06/2020   MCV 94.1 05/06/2020   PLT 246 05/06/2020   NEUTROABS 5.3 05/06/2020    ASSESSMENT & PLAN:  Malignant neoplasm of upper-outer quadrant of left breast in female, estrogen receptor positive (HCC) 01/22/2019:Patient palpated a left breast mass. Mammogram showed a 0.9cm mass in the left breast at the 3 o'clock position, no left axillary adenopathy. 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  T1BN0 stage Ia   Recommendations: 1. 02/21/2019: Left lumpectomy: Grade 2 IDC 0.9 cm with clear margins, 0/2 lymph nodes negative, ER 95%, PR 95%, Ki-67 15%, HER-2 negative, T1b N0 stage Ia 2. Oncotype DX: Recurrence score 3, distant recurrence at 9 years: 3% 3. Adjuvant radiation therapy 04/02/2019-04/30/2019 4. Adjuvant antiestrogen therapy with anastrozole  1 mg daily started January 2021 5.  Genetics: 02/07/2019: VUS BRCA2 ------------------------------------------------------------------------------------------------------------------------------------------------- Anastrozole  toxicities: Denies any adverse effects.  She has mild hot flashes and joint stiffness from before.  These are unchanged. Recommended continuation of antiestrogen therapy for 7 years total  Breast cancer surveillance: 1.  Breast exam 11/28/2023: Benign 2. Mammogram 11/07/2023:  Benign breast density category C   Currently on Fosamax  with calcium and vitamin D   Heart catheterization 10/11/2019: LV dysfunction EF 40% follows with cardiology and apparently its gotten significantly better. Return to clinic in 1 year for follow-up ------------------------------------- Assessment and Plan Assessment & Plan Estrogen receptor positive malignant neoplasm of upper-outer quadrant of left breast, status post treatment Five years post-diagnosis, on anastrozole  therapy without significant side effects. Recent mammogram showed no concerning findings. - Continue anastrozole  therapy.  Osteoporosis on bisphosphonate therapy Osteoporosis managed with Fosamax  (alendronate ). - Continue Fosamax  (alendronate ). - Schedule bone density assessment next year.  Breast density, category C (scattered areas of fibroglandular density) Breast density categorized as C, indicating scattered areas of fibroglandular density. - Monitor breast density with routine mammograms.      No orders of the defined types were placed in this encounter.  The patient has a good understanding of the overall plan. she agrees with it. she will call with any problems that may develop before the next visit here. Total time spent: 30 mins including face to face time and time spent for planning, charting and co-ordination of care   Viinay K Egan Sahlin, MD 11/28/23

## 2023-11-29 ENCOUNTER — Encounter: Payer: Self-pay | Admitting: Cardiology

## 2023-11-29 ENCOUNTER — Ambulatory Visit: Attending: Cardiology | Admitting: Cardiology

## 2023-11-29 VITALS — BP 110/70 | HR 83 | Ht 64.0 in | Wt 116.0 lb

## 2023-11-29 DIAGNOSIS — I5032 Chronic diastolic (congestive) heart failure: Secondary | ICD-10-CM

## 2023-11-29 NOTE — Patient Instructions (Signed)
 Medication Instructions:  Your physician recommends that you continue on your current medications as directed. Please refer to the Current Medication list given to you today.  *If you need a refill on your cardiac medications before your next appointment, please call your pharmacy*  Lab Work: NONE   If you have labs (blood work) drawn today and your tests are completely normal, you will receive your results only by: MyChart Message (if you have MyChart) OR A paper copy in the mail If you have any lab test that is abnormal or we need to change your treatment, we will call you to review the results.  Testing/Procedures: NONE   Follow-Up: At [facility], you and your health needs are our priority.  As part of our continuing mission to provide you with exceptional heart care, our providers are all part of one team.  This team includes your primary Cardiologist (physician) and Advanced Practice Providers or APPs (Physician Assistants and Nurse Practitioners) who all work together to provide you with the care you need, when you need it.  Your next appointment:   1 year(s)  Provider:   You may see Alvan Carrier, MD or one of the following Advanced Practice Providers on your designated Care Team:   Laymon Qua, PA-C  Sc[place]ia [place], [place] Olivia Pavy, [place]     We recommend signing up for the patient portal called MyChart.  Sign up information is provided on this After Visit Summary.  MyChart is used to connect with patients for Virtual Visits (Telemedicine).  Patients are able to view lab/test results, encounter notes, upcoming appointments, etc.  Non-urgent messages can be sent to your provider as well.   To learn more about what you can do with MyChart, go to [email or web address].   Other Instructions Thank you for choosing [facility]!

## 2023-11-29 NOTE — Progress Notes (Signed)
 Clinical Summary Ms. Papadopoulos is a 68 y.o.female seen today for follow up of the following medical problems.    1. Chronic systolic HF   - 08/2019 echo LVEF 30-35%, Normal RV function - 09/2019 cath: normal coronaries. Mean PA 12, PCWP 1, LVEDP 1, CI 3.4  02/2020 echo LVEF 45-50%.   02/2021 echo: LVEF 55-60%, no WMAs   - no SOB/DOE, no recent edema - compliant with meds       2. Chronic Tachycardia - can have some palpitations with high levels of activity but not at rest - reports his was an issue since her admission Jan 2021 with pneumona - CT PE at that time was negative.        3. Nocturnal hypoxia - has home O2, has not needed   4. Breast cancer - s/p lumpectomy - has had radiation treatment    5. Hyperthyroid - followed by endo   6. Asthma - followe [facility] /asthma Past Medical History:  Diagnosis Date   Cancer [facility])    left breast   Chronic systolic (congestive) heart failure (HCC)    a. EF 30-35% by echo in 08/2019 b. cath in 09/2019 showing normal cors and EF at 40-45%   Colon polyps    Family history of breast cancer    Family history of leukemia    Hypertension    Personal history of radiation therapy    Dec 2020- Jan 2021   PONV (postoperative nausea and vomiting)    Thyroid  disease    h/o hyperactive, just being followed by endocrinologist     Allergies  Allergen Reactions   Hydromet [Hydrocodone Bit-Homatrop Mbr] Nausea And Vomiting     Current Outpatient Medications  Medication Sig Dispense Refill   albuterol  (VENTOLIN  HFA) 108 (90 Base) MCG/ACT inhaler Inhale 1 puff into the lungs every 4 (four) hours as needed for wheezing or shortness of breath.     alendronate  (FOSAMAX ) 70 MG tablet Take 70 mg by mouth every Sunday.      anastrozole  (ARIMIDEX ) 1 MG tablet Take 1 tablet (1 mg total) by mouth daily. 90 tablet 3   Ascorbic Acid (VITAMIN C) 500 MG CAPS See admin instructions.     Azelastine HCl 0.15 % SOLN Place into the nose in  the morning and at bedtime.     b complex vitamins tablet Take 1 tablet by mouth daily.     calcium carbonate (OS-CAL) 600 MG TABS Take 600 mg by mouth daily with breakfast.     cholecalciferol (VITAMIN D3) 25 MCG (1000 UT) tablet Take 1,000 Units by mouth daily.     Fluticasone-Umeclidin-Vilant (TRELEGY ELLIPTA) 100-62.5-25 MCG/ACT AEPB Inhale into the lungs.     levocetirizine (XYZAL ) 5 MG tablet TAKE 1 TABLET BY MOUTH ONCE DAILY IN THE EVENING 30 tablet 5   melatonin 3 MG TABS tablet 1 tablet at bedtime as needed     metoprolol  succinate (TOPROL -XL) 25 MG 24 hr tablet Take 1.5 tablets (37.5 mg total) by mouth daily. 45 tablet 0   montelukast  (SINGULAIR ) 10 MG tablet Take 1 tablet (10 mg total) by mouth at bedtime. 30 tablet 5   Multiple Vitamin (MULTIVITAMIN) capsule Take 1 capsule by mouth daily.     pantoprazole  (PROTONIX ) 40 MG tablet Take 40 mg by mouth daily.     sacubitril-valsartan (ENTRESTO ) 24-26 MG Take 1 tablet by mouth 2 (two) times daily. 180 tablet 3   TRELEGY ELLIPTA 200-62.5-25 MCG/ACT AEPB SMARTSIG:1 Inhalation By  Mouth Daily     vitamin E 100 UNIT capsule Take 100 Units by mouth daily.     zinc gluconate 50 MG tablet Take 50 mg by mouth daily.     No current facility-administered medications for this visit.     Past Surgical History:  Procedure Laterality Date   ABDOMINAL HYSTERECTOMY  2000   BREAST LUMPECTOMY Left 2020   BREAST LUMPECTOMY WITH RADIOACTIVE SEED AND SENTINEL LYMPH NODE BIOPSY Left 02/21/2019   Procedure: LEFT BREAST LUMPECTOMY WITH RADIOACTIVE SEED;  Surgeon: Vanderbilt Ned, MD;  Location: [facility] OR;  Service: General;  Laterality: Left;   CATARACT EXTRACTION W/PHACO Left 03/10/2014   Procedure: CATARACT EXTRACTION PHACO AND INTRAOCULAR LENS PLACEMENT LEFT EYE;  Surgeon: Cherene Mania, MD;  Location: [facility] ORS;  Service: Ophthalmology;  Laterality: Left;  CDE 3.50   CATARACT EXTRACTION W/PHACO Right 04/14/2014   Procedure: CATARACT EXTRACTION PHACO AND  INTRAOCULAR LENS PLACEMENT (IOC);  Surgeon: Cherene Mania, MD;  Location: [facility] ORS;  Service: Ophthalmology;  Laterality: Right;  CDE 3.35   COLONOSCOPY  11/2008   Dr. Shaaron: normal. Next colonoscopy in 2015 due to h/o polyps by Dr. Claudene and path unknown   COLONOSCOPY N/A 12/12/2014   Procedure: COLONOSCOPY;  Surgeon: Lamar CHRISTELLA Shaaron, MD;  Location: [facility] END[facility] SUITE;  Service: Endoscopy;  Laterality: N/A;  1230   COLONOSCOPY WITH PROPOFOL  N/A 12/08/2021   Procedure: COLONOSCOPY WITH PROPOFOL ;  Surgeon: Shaaron Lamar CHRISTELLA, MD;  Location: [facility] END[facility]ITE;  Service: Endoscopy;  Laterality: N/A;  11:30am   GANGLION CYST EXCISION Left    x2-wrist- Smith   RIGHT/LEFT HEART CATH AND CORONARY ANGIOGRAPHY N/A 10/11/2019   Procedure: RIGHT/LEFT HEART CATH AND CORONARY ANGIOGRAPHY;  Surgeon: Darron Deatrice LABOR, MD;  Location: [facility] INVASIVE CV LAB;  Service: Cardiovascular;  Laterality: N/A;   SENTINEL NODE BIOPSY Left 02/21/2019   Procedure: Left Sentinel Lymph Node Mapping;  Surgeon: Vanderbilt Ned, MD;  Location: [facility] OR;  Service: General;  Laterality: Left;     Allergies  Allergen Reactions   Hydromet [Hydrocodone Bit-Homatrop Mbr] Nausea And Vomiting      Family History  Problem Relation Age of Onset   Breast cancer Sister        diagnosed in her 41s   Stroke Sister    Breast cancer Maternal Grandmother        diagnosed in her 52s or 49s   Breast cancer Sister        diagnosed in her 70s   Stroke Mother    Leukemia Maternal Aunt        diagnosed in her 56s   Colon cancer Neg Hx      Social History Ms. Pascale reports that she has never smoked. She has never used smokeless tobacco. Ms. Aikens reports no history of alcohol use.   Physical Examination Today's Vitals   11/29/23 1349  BP: 110/70  Pulse: 83  SpO2: 97%  Weight: 116 lb (52.6 kg)  Height: 5' 4 (1.626 m)   Body mass index is 19.91 kg/m.  Gen: resting comfortably, no acute distress HEENT: no scleral icterus, pupils equal round and  reactive, no palptable cervical adenopathy,  CV: RRR, no mrg, no jvd Resp: Clear to auscultation bilaterally GI: abdomen is soft, non-tender, non-distended, normal bowel sounds, no hepatosplenomegaly MSK: extremities are warm, no edema.  Skin: warm, no rash Neuro:  no focal deficits Psych: appropriate affect   Diagnostic Studies  08/2019 echo IMPRESSIONS     1. Left ventricular ejection  fraction, by estimation, is 30 to 35%. The  left ventricle has moderately decreased function. The left ventricle  demonstrates global hypokinesis with some regional variation. Left  ventricular diastolic parameters are  indeterminate. Patient appeared to be in sinus tachycardia throughout the  study.   2. Right ventricular systolic function is normal. The right ventricular  size is normal. There is normal pulmonary artery systolic pressure. The  estimated right ventricular systolic pressure is 34.8 mmHg.   3. The mitral valve is grossly normal. Trivial mitral valve  regurgitation.   4. The aortic valve is tricuspid. Aortic valve regurgitation is not  visualized. Mild aortic valve sclerosis is present, with no evidence of  aortic valve stenosis.   5. The inferior vena cava is normal in size with greater than 50%  respiratory variability, suggesting right atrial pressure of 3 mmHg.      02/2020 echo IMPRESSIONS     1. Left ventricular ejection fraction, by estimation, is 45 to 50%. The  left ventricle has mildly decreased function. The left ventricle  demonstrates global hypokinesis. Left ventricular diastolic parameters  were normal.   2. Right ventricular systolic function is normal. The right ventricular  size is normal. There is normal pulmonary artery systolic pressure. The  estimated right ventricular systolic pressure is 22.9 mmHg.   3. The mitral valve is grossly normal. Trivial mitral valve  regurgitation.   4. The aortic valve is tricuspid. There is mild calcification of the   aortic valve. Aortic valve regurgitation is not visualized.   5. The inferior vena cava is normal in size with greater than 50%  respiratory variability, suggesting right atrial pressure of 3 mmHg.    02/2021 echo IMPRESSIONS     1. Left ventricular ejection fraction, by estimation, is 55 to 60%. The  left ventricle has normal function. The left ventricle has no regional  wall motion abnormalities. Left ventricular diastolic parameters were  normal.   2. Right ventricular systolic function is normal. The right ventricular  size is normal. There is normal pulmonary artery systolic pressure.   3. The mitral valve is normal in structure. No evidence of mitral valve  regurgitation. No evidence of mitral stenosis.   4. The tricuspid valve is abnormal.   5. The aortic valve is tricuspid. There is mild calcification of the  aortic valve. There is mild thickening of the aortic valve. Aortic valve  regurgitation is not visualized. No aortic stenosis is present.   6. The inferior vena cava is normal in size with greater than 50%  respiratory variability, suggesting right atrial pressure of 3 mmHg.      Assessment and Plan   1. HFimpEF - LVEF has normalized, euvolemic without symptoms - continue current meds    EKG today shows NSR     Dorn PHEBE Ross, M.D.

## 2023-12-12 DIAGNOSIS — I110 Hypertensive heart disease with heart failure: Secondary | ICD-10-CM | POA: Diagnosis not present

## 2023-12-12 DIAGNOSIS — J45909 Unspecified asthma, uncomplicated: Secondary | ICD-10-CM | POA: Diagnosis not present

## 2023-12-12 DIAGNOSIS — K219 Gastro-esophageal reflux disease without esophagitis: Secondary | ICD-10-CM | POA: Diagnosis not present

## 2023-12-12 DIAGNOSIS — I10 Essential (primary) hypertension: Secondary | ICD-10-CM | POA: Diagnosis not present

## 2023-12-12 DIAGNOSIS — E039 Hypothyroidism, unspecified: Secondary | ICD-10-CM | POA: Diagnosis not present

## 2023-12-12 DIAGNOSIS — I5022 Chronic systolic (congestive) heart failure: Secondary | ICD-10-CM | POA: Diagnosis not present

## 2023-12-26 ENCOUNTER — Other Ambulatory Visit: Payer: Self-pay | Admitting: Cardiology

## 2024-03-12 DIAGNOSIS — J45909 Unspecified asthma, uncomplicated: Secondary | ICD-10-CM | POA: Diagnosis not present

## 2024-03-12 DIAGNOSIS — M65331 Trigger finger, right middle finger: Secondary | ICD-10-CM | POA: Diagnosis not present

## 2024-03-12 DIAGNOSIS — K219 Gastro-esophageal reflux disease without esophagitis: Secondary | ICD-10-CM | POA: Diagnosis not present

## 2024-03-12 DIAGNOSIS — M65332 Trigger finger, left middle finger: Secondary | ICD-10-CM | POA: Diagnosis not present

## 2024-03-12 DIAGNOSIS — E7800 Pure hypercholesterolemia, unspecified: Secondary | ICD-10-CM | POA: Diagnosis not present

## 2024-11-28 ENCOUNTER — Ambulatory Visit: Admitting: Hematology and Oncology
